# Patient Record
Sex: Male | Born: 1945 | Race: White | Hispanic: No | Marital: Married | Smoking: Former smoker
Health system: Southern US, Community
[De-identification: ages and names within clinical notes are randomized; demographics above are authoritative.]

## PROBLEM LIST (undated history)

## (undated) DIAGNOSIS — E7800 Pure hypercholesterolemia, unspecified: Secondary | ICD-10-CM

## (undated) DIAGNOSIS — I6350 Cerebral infarction due to unspecified occlusion or stenosis of unspecified cerebral artery: Secondary | ICD-10-CM

## (undated) DIAGNOSIS — H269 Unspecified cataract: Secondary | ICD-10-CM

## (undated) DIAGNOSIS — I671 Cerebral aneurysm, nonruptured: Secondary | ICD-10-CM

## (undated) DIAGNOSIS — Z8669 Personal history of other diseases of the nervous system and sense organs: Secondary | ICD-10-CM

## (undated) DIAGNOSIS — R040 Epistaxis: Secondary | ICD-10-CM

## (undated) DIAGNOSIS — IMO0001 Reserved for inherently not codable concepts without codable children: Secondary | ICD-10-CM

## (undated) DIAGNOSIS — T884XXA Failed or difficult intubation, initial encounter: Secondary | ICD-10-CM

## (undated) DIAGNOSIS — G039 Meningitis, unspecified: Secondary | ICD-10-CM

## (undated) DIAGNOSIS — N189 Chronic kidney disease, unspecified: Secondary | ICD-10-CM

## (undated) DIAGNOSIS — I10 Essential (primary) hypertension: Secondary | ICD-10-CM

## (undated) DIAGNOSIS — D509 Iron deficiency anemia, unspecified: Secondary | ICD-10-CM

## (undated) DIAGNOSIS — R351 Nocturia: Secondary | ICD-10-CM

## (undated) DIAGNOSIS — G629 Polyneuropathy, unspecified: Secondary | ICD-10-CM

## (undated) DIAGNOSIS — Z87448 Personal history of other diseases of urinary system: Secondary | ICD-10-CM

## (undated) DIAGNOSIS — K219 Gastro-esophageal reflux disease without esophagitis: Secondary | ICD-10-CM

## (undated) DIAGNOSIS — I729 Aneurysm of unspecified site: Secondary | ICD-10-CM

## (undated) DIAGNOSIS — Z8719 Personal history of other diseases of the digestive system: Secondary | ICD-10-CM

## (undated) DIAGNOSIS — L219 Seborrheic dermatitis, unspecified: Secondary | ICD-10-CM

## (undated) DIAGNOSIS — Z87898 Personal history of other specified conditions: Secondary | ICD-10-CM

## (undated) DIAGNOSIS — E785 Hyperlipidemia, unspecified: Secondary | ICD-10-CM

## (undated) DIAGNOSIS — F4312 Post-traumatic stress disorder, chronic: Secondary | ICD-10-CM

## (undated) DIAGNOSIS — N401 Enlarged prostate with lower urinary tract symptoms: Secondary | ICD-10-CM

## (undated) DIAGNOSIS — G4730 Sleep apnea, unspecified: Secondary | ICD-10-CM

## (undated) DIAGNOSIS — F411 Generalized anxiety disorder: Secondary | ICD-10-CM

## (undated) DIAGNOSIS — E119 Type 2 diabetes mellitus without complications: Secondary | ICD-10-CM

## (undated) DIAGNOSIS — R3915 Urgency of urination: Secondary | ICD-10-CM

## (undated) DIAGNOSIS — R578 Other shock: Secondary | ICD-10-CM

## (undated) DIAGNOSIS — D649 Anemia, unspecified: Secondary | ICD-10-CM

## (undated) HISTORY — DX: Other shock: R57.8

## (undated) HISTORY — DX: Aneurysm of unspecified site: I72.9

## (undated) HISTORY — DX: Personal history of other diseases of the digestive system: Z87.19

## (undated) HISTORY — DX: Type 2 diabetes mellitus without complications: E11.9

## (undated) HISTORY — DX: Meningitis, unspecified: G03.9

## (undated) HISTORY — DX: Personal history of other diseases of the nervous system and sense organs: Z86.69

## (undated) HISTORY — DX: Cerebral infarction due to unspecified occlusion or stenosis of unspecified cerebral artery: I63.50

## (undated) HISTORY — DX: Cerebral aneurysm, nonruptured: I67.1

## (undated) HISTORY — DX: Sleep apnea, unspecified: G47.30

## (undated) HISTORY — DX: Personal history of other diseases of urinary system: Z87.448

## (undated) HISTORY — DX: Hyperlipidemia, unspecified: E78.5

## (undated) HISTORY — PX: APPENDECTOMY: SHX54

## (undated) HISTORY — DX: Essential (primary) hypertension: I10

## (undated) HISTORY — DX: Generalized anxiety disorder: F41.1

## (undated) HISTORY — DX: Epistaxis: R04.0

---

## 2008-09-03 DIAGNOSIS — G039 Meningitis, unspecified: Secondary | ICD-10-CM

## 2008-09-03 HISTORY — DX: Meningitis, unspecified: G03.9

## 2008-10-25 ENCOUNTER — Inpatient Hospital Stay (HOSPITAL_COMMUNITY): Admission: AD | Admit: 2008-10-25 | Discharge: 2008-11-20 | Payer: Self-pay | Admitting: Emergency Medicine

## 2008-10-25 ENCOUNTER — Ambulatory Visit: Payer: Self-pay | Admitting: Cardiovascular Disease

## 2008-10-25 ENCOUNTER — Ambulatory Visit: Payer: Self-pay | Admitting: Internal Medicine

## 2008-10-26 ENCOUNTER — Encounter: Payer: Self-pay | Admitting: Emergency Medicine

## 2008-10-26 ENCOUNTER — Encounter (INDEPENDENT_AMBULATORY_CARE_PROVIDER_SITE_OTHER): Payer: Self-pay | Admitting: Family Medicine

## 2008-10-26 ENCOUNTER — Ambulatory Visit: Payer: Self-pay | Admitting: Infectious Diseases

## 2008-11-04 ENCOUNTER — Encounter: Payer: Self-pay | Admitting: Internal Medicine

## 2008-11-04 ENCOUNTER — Ambulatory Visit: Payer: Self-pay | Admitting: Physical Medicine & Rehabilitation

## 2008-11-08 ENCOUNTER — Ambulatory Visit: Payer: Self-pay | Admitting: Surgery

## 2008-11-08 ENCOUNTER — Encounter (INDEPENDENT_AMBULATORY_CARE_PROVIDER_SITE_OTHER): Payer: Self-pay | Admitting: Internal Medicine

## 2008-11-09 ENCOUNTER — Ambulatory Visit: Payer: Self-pay | Admitting: Gastroenterology

## 2008-11-18 ENCOUNTER — Encounter (INDEPENDENT_AMBULATORY_CARE_PROVIDER_SITE_OTHER): Payer: Self-pay | Admitting: Interventional Radiology

## 2008-12-08 ENCOUNTER — Inpatient Hospital Stay (HOSPITAL_COMMUNITY): Admission: EM | Admit: 2008-12-08 | Discharge: 2008-12-15 | Payer: Self-pay | Admitting: Emergency Medicine

## 2008-12-09 ENCOUNTER — Ambulatory Visit: Payer: Self-pay | Admitting: Infectious Diseases

## 2008-12-10 ENCOUNTER — Encounter: Payer: Self-pay | Admitting: Cardiovascular Disease

## 2008-12-13 ENCOUNTER — Encounter (INDEPENDENT_AMBULATORY_CARE_PROVIDER_SITE_OTHER): Payer: Self-pay | Admitting: Internal Medicine

## 2008-12-13 ENCOUNTER — Ambulatory Visit: Payer: Self-pay | Admitting: Vascular Surgery

## 2009-05-29 ENCOUNTER — Encounter: Payer: Self-pay | Admitting: Infectious Diseases

## 2009-05-31 ENCOUNTER — Encounter: Payer: Self-pay | Admitting: Infectious Diseases

## 2009-06-15 ENCOUNTER — Telehealth: Payer: Self-pay | Admitting: Infectious Diseases

## 2009-07-04 ENCOUNTER — Encounter (INDEPENDENT_AMBULATORY_CARE_PROVIDER_SITE_OTHER): Payer: Self-pay | Admitting: *Deleted

## 2009-07-04 DIAGNOSIS — I6350 Cerebral infarction due to unspecified occlusion or stenosis of unspecified cerebral artery: Secondary | ICD-10-CM | POA: Insufficient documentation

## 2009-07-04 DIAGNOSIS — I10 Essential (primary) hypertension: Secondary | ICD-10-CM | POA: Insufficient documentation

## 2009-07-04 DIAGNOSIS — Z87448 Personal history of other diseases of urinary system: Secondary | ICD-10-CM

## 2009-07-04 DIAGNOSIS — Z8719 Personal history of other diseases of the digestive system: Secondary | ICD-10-CM

## 2009-07-04 DIAGNOSIS — Z87898 Personal history of other specified conditions: Secondary | ICD-10-CM

## 2009-07-04 DIAGNOSIS — E785 Hyperlipidemia, unspecified: Secondary | ICD-10-CM

## 2009-07-04 DIAGNOSIS — G510 Bell's palsy: Secondary | ICD-10-CM | POA: Insufficient documentation

## 2009-07-04 HISTORY — DX: Bell's palsy: G51.0

## 2009-07-04 HISTORY — DX: Personal history of other specified conditions: Z87.898

## 2009-07-04 HISTORY — DX: Hyperlipidemia, unspecified: E78.5

## 2009-07-06 ENCOUNTER — Ambulatory Visit: Payer: Self-pay | Admitting: Infectious Diseases

## 2009-07-06 DIAGNOSIS — A4101 Sepsis due to Methicillin susceptible Staphylococcus aureus: Secondary | ICD-10-CM

## 2009-07-06 LAB — CONVERTED CEMR LAB
AST: 21 units/L (ref 0–37)
BUN: 20 mg/dL (ref 6–23)
Basophils Absolute: 0 10*3/uL (ref 0.0–0.1)
Calcium: 9 mg/dL (ref 8.4–10.5)
Chloride: 105 meq/L (ref 96–112)
Creatinine, Ser: 1.05 mg/dL (ref 0.40–1.50)
Eosinophils Absolute: 0.6 10*3/uL (ref 0.0–0.7)
Eosinophils Relative: 8 % — ABNORMAL HIGH (ref 0–5)
HCT: 37.1 % — ABNORMAL LOW (ref 39.0–52.0)
Hemoglobin: 12.3 g/dL — ABNORMAL LOW (ref 13.0–17.0)
IgA: 350 mg/dL (ref 68–378)
IgM, Serum: 76 mg/dL (ref 60–263)
Lymphocytes Relative: 20 % (ref 12–46)
Lymphs Abs: 1.4 10*3/uL (ref 0.7–4.0)
MCV: 85.7 fL (ref >=78.0)
Monocytes Absolute: 0.5 10*3/uL (ref 0.1–1.0)
RDW: 14.8 % (ref 11.5–15.5)
Total Bilirubin: 0.3 mg/dL (ref 0.3–1.2)

## 2009-07-07 ENCOUNTER — Encounter: Payer: Self-pay | Admitting: Infectious Diseases

## 2009-07-19 ENCOUNTER — Encounter: Payer: Self-pay | Admitting: Infectious Diseases

## 2009-07-20 ENCOUNTER — Ambulatory Visit: Payer: Self-pay | Admitting: Infectious Diseases

## 2009-07-20 ENCOUNTER — Telehealth (INDEPENDENT_AMBULATORY_CARE_PROVIDER_SITE_OTHER): Payer: Self-pay | Admitting: *Deleted

## 2009-07-20 DIAGNOSIS — R197 Diarrhea, unspecified: Secondary | ICD-10-CM | POA: Insufficient documentation

## 2009-07-21 ENCOUNTER — Telehealth: Payer: Self-pay | Admitting: Infectious Diseases

## 2009-07-21 ENCOUNTER — Telehealth (INDEPENDENT_AMBULATORY_CARE_PROVIDER_SITE_OTHER): Payer: Self-pay | Admitting: *Deleted

## 2009-08-17 ENCOUNTER — Ambulatory Visit: Payer: Self-pay | Admitting: Infectious Diseases

## 2009-12-25 ENCOUNTER — Encounter: Payer: Self-pay | Admitting: Infectious Diseases

## 2009-12-26 ENCOUNTER — Telehealth: Payer: Self-pay | Admitting: Infectious Diseases

## 2010-01-03 ENCOUNTER — Telehealth: Payer: Self-pay | Admitting: Infectious Diseases

## 2010-01-04 ENCOUNTER — Ambulatory Visit: Payer: Self-pay | Admitting: Internal Medicine

## 2010-01-04 ENCOUNTER — Inpatient Hospital Stay (HOSPITAL_COMMUNITY): Admission: EM | Admit: 2010-01-04 | Discharge: 2010-01-11 | Payer: Self-pay | Admitting: Emergency Medicine

## 2010-01-04 ENCOUNTER — Encounter: Payer: Self-pay | Admitting: Internal Medicine

## 2010-01-05 ENCOUNTER — Ambulatory Visit: Payer: Self-pay | Admitting: Vascular Surgery

## 2010-01-05 ENCOUNTER — Encounter: Payer: Self-pay | Admitting: Internal Medicine

## 2010-01-19 ENCOUNTER — Encounter: Payer: Self-pay | Admitting: Internal Medicine

## 2010-01-20 ENCOUNTER — Encounter: Payer: Self-pay | Admitting: Infectious Diseases

## 2010-01-23 ENCOUNTER — Ambulatory Visit: Payer: Self-pay | Admitting: Infectious Diseases

## 2010-02-15 ENCOUNTER — Ambulatory Visit: Payer: Self-pay | Admitting: Infectious Diseases

## 2010-02-28 ENCOUNTER — Encounter: Payer: Self-pay | Admitting: Infectious Diseases

## 2010-03-07 ENCOUNTER — Encounter: Payer: Self-pay | Admitting: Infectious Diseases

## 2010-05-22 ENCOUNTER — Ambulatory Visit: Payer: Self-pay | Admitting: Infectious Diseases

## 2010-09-23 ENCOUNTER — Other Ambulatory Visit: Payer: Self-pay | Admitting: Pediatrics

## 2010-09-23 DIAGNOSIS — I639 Cerebral infarction, unspecified: Secondary | ICD-10-CM

## 2010-09-24 ENCOUNTER — Encounter: Payer: Self-pay | Admitting: Nephrology

## 2010-10-03 NOTE — Miscellaneous (Signed)
Summary: [facility]: Orders  [facility]: Orders   Imported By: Bonner Puna 03/17/2010 16:20:37  _____________________________________________________________________  External Attachment:    Type:   Image     Comment:   External Document

## 2010-10-03 NOTE — Procedures (Signed)
Summary: Summary Report  Summary Report   Imported By: Gemma Payor 02/24/2010 10:15:42  _____________________________________________________________________  External Attachment:    Type:   Image     Comment:   External Document

## 2010-10-03 NOTE — Assessment & Plan Note (Signed)
Summary: f/u per paula[mkj]   CC:  follow-up visit.  History of Present Illness: 65 yo Haley with hx of adm 10-25-08 with MSSA bacteremia and CT showing acute vs subacute infarcts. He had a TTE (-) and was d/c home with iv ancef for 4 weeks. He was then adm 12-08-08 to 12-15-08 at [facility]. He was found to have a R facial droop and dificulty closing R eye. He had a RI showing multiple areas c/i blood products with some enhancement. Also had mild scattered white matter dz. He had a TEE that was negative. He was changed to IV ancef for 6 weeks of total therapy. His course was also complicated by increased Cr. He returns today after he had been hospitalized at [facility] 05-30-09 to 06-03-09 with Staph aureus (BCx 3/3 and UrineCx). Repeat Bcx done 9-28 negative. He was again started on a 6 week course of ancef. He had repeat BCx 11-3 which were negative.  Returned 07-20-09 with diarrhea- states that he feels like a herd of elephants stepped on his stomach. stayed in bed all day on 11-15. He was found to be C diff+ and his keflex was stopped, by mouth vanco was ordered. he did not want to take this and he was instead given a course of flagyl.   April 2011 [facility] was diagnosed with MSSA bacteremia and was discharged on April 30 with plan to complete 6 weeks of IV Ancef for treatment of this condition. Additionally, he was recently hospitalized in February 2010 for bacterial meningitis.  He presented to the [facility] Emergency Department on May 4 with a chief complaint of severe headaches and high blood pressure.  He described his headache as 10/10 throughout his entire head without any radiation.  He was thought to have rebled into one of his previous areas from Arpil [address] from [facility]. He had TEE in April at [place] as well, negative. 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 He was d/c home on 01-11-10, continued on ancef.  Today  c/o pruritis on his RUE from [facility] site.  Preventive Screening-Counseling & Management  Alcohol-Tobacco     Alcohol drinks/day: 0     Smoking Status: quit     Year Quit: 2010  Caffeine-Diet-Exercise     Caffeine use/day: coffee     Does Patient Exercise: yes     Type of exercise: active around the house  Safety-Violence-Falls     Seat Belt Use: yes   Current Allergies (reviewed today): ! * PLASTIC TAPE Past History:  Past Medical History: MSSA bacteremia, recurrent HTN Hyperlipidemia Pernicious Anemia  Current Medications (verified): 1)  Aspirin 325 Mg Tabs (Aspirin) .... Take 1 Tablet By Mouth Once A Day 2)  Norvasc 5 Mg Tabs (Amlodipine Besylate) .... Take 1 Tablet By Mouth Once A Day 3)  Toprol Xl 100 Mg Xr24h-Tab (Metoprolol Succinate) .... Take 1 Tablet By Mouth Once A Day 4)  Lovaza 1 Gm Caps (Omega-3-Acid Ethyl Esters) .... Take 2 Capsules By Mouth Two Times A Day 5)  Crestor 10 Mg Tabs (Rosuvastatin Calcium) .... Take 1 Tablet By Mouth Once A Day 6)  Lisinopril 40 Mg Tabs (Lisinopril) .... Take 1 Tablet By Mouth Once A Day 7)  Nexium 40 Mg Cpdr (Esomeprazole Magnesium) .... Take 1 Tablet By Mouth Once A Day 8)  Prenavite Multiple Vitamin 28-0.8 Mg Tabs (Prenatal Vit-Fe Fumarate-Fa) .... Take 1 Tablet By Mouth Once A  Day 9)  Vitamin B-12 1000 Mcg Tabs (Cyanocobalamin) .... Take 1 Tablet By Mouth Once A Day 10)  Cefazolin Sodium 1 Gm Solr (Cefazolin Sodium) .... 2 G Q8h 11)  Tamsulosin Hcl 0.4 Mg Caps (Tamsulosin Hcl) .... Take 1 Tablet By Mouth At Bedtime 12)  Celexa 40 Mg Tabs (Citalopram Hydrobromide) .... Take 1 Tablet By Mouth Once A Day 13)  Ultram 50 Mg Tabs (Tramadol Hcl) .... Q6h As Needed 14)  Hydrochlorothiazide 25 Mg Tabs (Hydrochlorothiazide) .... Take 1 Tablet By Mouth Once A Day 15)  Depakote 500 Mg Tbec (Divalproex Sodium) .... Take 1 Tablet By Mouth Two Times A Day Barry)  Klonopin 0.5 Mg Tabs (Clonazepam) .... Q8h As Needed  Allergies (verified): 1)  !  * Plastic Tape   Social History: Reviewed history from 07/06/2009 and no changes required. Former Smoker Alcohol use-no Married  Review of Systems       c/o  weakness, loss of peripheral vision, has had lower BP due to med adjustment, use of anxiolytics. headache is mostly resolved, has on L occipital area when he does have them. has had limited exertion. has gained 7#.   Vital Signs:  Patient profile:   65 year old male Height:      70 inches (177.80 cm) Weight:      202.8 pounds (92.18 kg) BMI:     29.20 Temp:     98.2 degrees F (36.78 degrees C) oral Pulse rate:   62 / minute BP sitting:   120 / 72  (left arm)  Vitals Entered By: Rocky Morel) (Jan 23, 2010 11:49 AM) CC: follow-up visit Pain Assessment Patient in pain? no      Nutritional Status BMI of 25 - 29 = overweight Nutritional Status Detail appetite is good per patient  Does patient need assistance? Functional Status Self care Ambulation Normal   Physical Exam  General:  well-developed, well-nourished, and well-hydrated.   Eyes:  pupils equal, pupils round, and pupils reactive to light.   Mouth:  pharynx pink and moist and no exudates.   Neck:  no masses.   Lungs:  normal respiratory effort and normal breath sounds.   Heart:  normal rate, regular rhythm, and no murmur.   Abdomen:  soft, non-tender, and normal bowel sounds.   Extremities:  has papular rash on RUE around tape fro his PIC. his PIC is clean, non-tender, no d/c.    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 advised him that he may use hydrocortionse around his [facility] for his rash, not on his PIC.  have him return after June 10 to recheck his BCx and labs.   Medications Added to Medication List This Visit: 1)  Norvasc 5 Mg  Tabs (Amlodipine besylate) .... Take 1 tablet by mouth once a day 2)  Lisinopril 40 Mg Tabs (Lisinopril) .... Take 1 tablet by mouth once a day 3)  Cefazolin Sodium 1 Gm Solr (Cefazolin sodium) .... 2 g q8h 4)  Tamsulosin Hcl 0.4 Mg Caps (Tamsulosin hcl) .... Take 1 tablet by mouth at bedtime 5)  Celexa 40 Mg Tabs (Citalopram hydrobromide) .... Take 1 tablet by mouth once a day 6)  Ultram 50 Mg Tabs (Tramadol hcl) .... Q6h as needed 7)  Hydrochlorothiazide 25 Mg Tabs (Hydrochlorothiazide) .Marland KitchenMarland KitchenMarland Kitchen  Take 1 tablet by mouth once a day 8)  Depakote 500 Mg Tbec (Divalproex sodium) .... Take 1 tablet by mouth two times a day 9)  Klonopin 0.5 Mg Tabs (Clonazepam) .... Q8h as needed 10)  Rifadin 300 Mg Caps (Rifampin) .... Take 1 tablet by mouth once a day Prescriptions: RIFADIN 300 MG CAPS (RIFAMPIN) Take 1 tablet by mouth once a day  #30 x 1   Entered and Authorized by:   Bobby Rumpf MD   Signed by:   Bobby Rumpf MD on 01/23/2010   Method used:   Print then Give to Patient   RxID:   [id number]   Appended Document: Office Visit (Infectious Disease)  BCx 01-04-10 negative.     Current Allergies: ! 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 if he has cont BCx+ will check IG levels and Indium scan. WIll start him on rifampin in addition to his other antibiotics at this point. it is not listed as an allergy in his hospital med list and they have no recollection of why it was not started previously. I do not have records indicating that his staph is resistant to this medication. return to clinic 3 weeks.   Orders: Est. Patient Level IV [id number]) Prescriptions: RIFADIN 300 MG CAPS (RIFAMPIN) Take 1 tablet by mouth once a day  #30 x 1   Entered and Authorized by:   Bobby Rumpf MD   Signed by:   Bobby Rumpf MD on 01/23/2010   Method used:   Electronically to        [facility]   [address] (retail)       [address], [place]  [address]       Ph: [phone] or [phone]       Fax: [phone]   RxID:   [id number]

## 2010-10-03 NOTE — Progress Notes (Signed)
Summary: Daughter calling about patient  Phone Note Call from Patient   Caller: Daughter Summary of Call: Daughter of patient called in reference to inform our office that patient had an ED visit at [facility] on 12-25-09. Patient had blood cultures and results according to daughter should be back on 12-26-09. If any questions, could call daughter at [phone]. Initial call taken by: Sherrie George [facility]),  December 26, 2009 12:32 PM  Follow-up for Phone Call        vomiting, fever since Sat. and Sunday he was taken to ED @ [facility]  last night given zofran and tylenol .   Today 101 and 102 with muscle aches and headache.  Daughter advised I will call Dr Johnnye Sima for advice.  Anderson Malta [id number] [facility] RN  December 26, 2009 4:51 PM   Additional Follow-up for Phone Call Additional follow up Details #1::        Per Dr Johnnye Sima pt was advise to return to ED if symptoms are worse.  Left message on voicemail to call the physician on call if she had any concerns about what to do. Orland Mustard RN  December 26, 2009 5:24 PM Also call the office  in the am for appt if needed.  Orland Mustard RN  December 26, 2009 5:24 PM     Additional Follow-up for Phone Call Additional follow up Details #2::    Voicemail:   Pt's daughter called :  he was taken back to the ED in [place] and admitted to the hospital with  a staph infection in his blood and urine.   Orland Mustard RN  December 27, 2009 3:56 PM  Follow-up by: Orland Mustard RN,  December 27, 2009 3:57 PM

## 2010-10-03 NOTE — Assessment & Plan Note (Signed)
Summary: FOLLOW UP VISIT / PW   CC:  follow-up visit.  History of Present Illness: 65 yo M with hx of adm 10-25-08 with MSSA bacteremia and CT showing acute vs subacute infarcts. He had a TTE (-) and was d/c home with iv ancef for 4 weeks. He was then adm 12-08-08 to 12-15-08 at [facility]. He was found to have a R facial droop and dificulty closing R eye. He had a RI showing multiple areas c/i blood products with some enhancement. Also had mild scattered white matter dz. He had a TEE that was negative. He was changed to IV ancef for 6 weeks of total therapy. His course was also complicated by increased Cr. He was then hospitalized at [facility] 05-30-09 to 06-03-09 with Staph aureus (BCx 3/3 and UrineCx). Repeat Bcx done 9-28 negative. He was again started on a 6 week course of ancef. He had repeat BCx 11-3 which were negative.  Returned 07-20-09 with diarrhea- found to be C diff+ and his keflex was stopped, by mouth vanco was ordered. he did not want to take this and he was instead given a course of flagyl.   Additionally, he was recently hospitalized in February 2010 for bacterial meningitis.  April 2011 [facility] was diagnosed with MSSA bacteremia and was discharged on April 30 with plan to complete 6 weeks of IV Ancef for treatment of this condition.   He presented to the [facility] Emergency Department on May 4 with a chief complaint of severe headaches and high blood pressure. He was thought to have rebled into one of his previous areas from Arpil [address] from [place] [facility]. He had TEE in April at [place] as well, negative. 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 He was d/c home on 01-11-10, continued on ancef rifampin added.  today states he is ready for his PIC to come out. c/o itching and burning. no fevers or chills. Headaches are "alot better". Finished ancef 02-11-10.   Preventive Screening-Counseling &  Management  Alcohol-Tobacco     Alcohol drinks/day: 0     Smoking Status: quit     Year Quit: 2010  Caffeine-Diet-Exercise     Caffeine use/day: coffee     Does Patient Exercise: yes     Type of exercise: active around the house  Safety-Violence-Falls     Seat Belt Use: yes   Updated Prior Medication List: ASPIRIN 325 MG TABS (ASPIRIN) Take 1 tablet by mouth once a day NORVASC 5 MG TABS (AMLODIPINE BESYLATE) Take 1 tablet by mouth once a day TOPROL XL 100 MG XR24H-TAB (METOPROLOL SUCCINATE) Take 1 tablet by mouth once a day LOVAZA 1 GM CAPS (OMEGA-3-ACID ETHYL ESTERS) Take 2 capsules by mouth two times a day CRESTOR 10 MG TABS (ROSUVASTATIN CALCIUM) Take 1 tablet by mouth once a day LISINOPRIL 40 MG TABS (LISINOPRIL) Take 1 tablet by mouth once a day NEXIUM 40 MG CPDR (ESOMEPRAZOLE MAGNESIUM) Take 1 tablet by mouth once a day PRENAVITE MULTIPLE VITAMIN 28-0.8 MG TABS (PRENATAL VIT-FE FUMARATE-FA) Take 1 tablet by mouth once a day VITAMIN B-12 1000 MCG TABS (CYANOCOBALAMIN) Take 1 tablet by mouth once a day TAMSULOSIN HCL 0.4 MG CAPS (TAMSULOSIN HCL) Take 1 tablet by mouth at bedtime CELEXA 40 MG TABS (CITALOPRAM HYDROBROMIDE) Take 1 tablet by mouth once a day ULTRAM 50 MG TABS (TRAMADOL HCL) q6h as needed HYDROCHLOROTHIAZIDE 25 MG TABS (HYDROCHLOROTHIAZIDE) Take 1 tablet by mouth  once a day DEPAKOTE 500 MG TBEC (DIVALPROEX SODIUM) Take 1 tablet by mouth two times a day KLONOPIN 0.5 MG TABS (CLONAZEPAM) q8h as needed  Current Allergies (reviewed today): ! * PLASTIC TAPE Past History:  Past medical, surgical, family and social histories (including risk factors) reviewed, and no changes noted (except as noted below).  Past Medical History: Reviewed history from 01/23/2010 and no changes required. MSSA bacteremia, recurrent HTN Hyperlipidemia Pernicious Anemia  Family History: Reviewed history from 07/06/2009 and no changes required. father- alzheinmers Family History  Diabetes 1st degree relative Family History Hypertension  Social History: Reviewed history from 07/06/2009 and no changes required. Former Smoker Alcohol use-no Married  Review of Systems       moving bowels regularly, no diarrhea. no dysuria or cloudiness. nocturia has improved some. no f/c.    Vital Signs:  Patient profile:   65 year old male Height:      70 inches (177.80 cm) Weight:      205.8 pounds (93.55 kg) BMI:     29.64 Temp:     98.3 degrees F (36.83 degrees C) oral Pulse rate:   70 / minute BP sitting:   149 / 82  (left arm)  Vitals Entered By: Rocky Morel) (February 15, 2010 2:04 PM) CC: follow-up visit Pain Assessment Patient in pain? no      Nutritional Status BMI of 25 - 29 = overweight Nutritional Status Detail appetite is good per patient  Does patient need assistance? Functional Status Self care Ambulation Normal   Physical Exam  General:  well-developed, well-nourished, and well-hydrated.   Eyes:  pupils equal, pupils round, and pupils reactive to light.   Mouth:  pharynx pink and moist, poor dentition, and teeth missing.   Neck:  no masses.   Lungs:  normal respiratory effort and normal breath sounds.   Heart:  normal rate, regular rhythm, and Grade  3 /6 systolic ejection murmur.   Abdomen:  soft, non-tender, and normal bowel sounds.   Extremities:  RUE PIC mild erythema around site. no d/c.    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 will pull his PIC. recheck his BCx. I considered putting him back on keflex for suppresion but he and his family are less than enthusiastic about this.  his family asks about having a urology eval for a prev bladder abn seen while he was in [place]. I have asked them to call Dr Lajuan Lines who saw pt in hospital at [facility].  return to clinic 4-6 weeks.   Orders: Est. Patient Level IV  [id number]) T-Culture, Blood Routine [id number]) T-Culture, Blood Routine [id number])  Problem # 2:  CLOSTRIDIUM DIFFICILE COLITIS, HX OF (ICD-V12.79) this appears to have resolved.   Appended Document: FOLLOW UP VISIT, PICC removed Order to remove PICC line received from Dr. Page Spiro .  Pt. identified with name and date of birth.  Dressing removed and PICC site unremarkable.  PICC line removed using sterile procedure @ 1500 pm.   PICC length 39cm.  Sterile petroleum gauze + sterile 4X4 applied to PICC site, pressure applied for 10 minutes and covered with Medipore tape as a pressure dressing.  Pt. instructed to limit use of arm for 1 hour.  Pt. instructed that the pressure dressing should remain in place for 24 hours.  Pt. verablized understanding of these instructions.  Lorne Skeens RN  February 15, 2010 3:52 PM

## 2010-10-03 NOTE — Assessment & Plan Note (Signed)
Summary: f/u [mkj]   CC:  follow-up visit.  History of Present Illness: 65 yo M with hx of adm 10-25-08 with MSSA bacteremia and CT showing acute vs subacute infarcts. He had a TTE (-) and was d/c home with iv ancef for 4 weeks. He was then adm 12-08-08 to 12-15-08 at [facility]. He was found to have a R facial droop and dificulty closing R eye. He had a RI showing multiple areas c/i blood products with some enhancement. Also had mild scattered white matter dz. He had a TEE that was negative. He was changed to IV ancef for 6 weeks of total therapy. His course was also complicated by increased Cr. He was then hospitalized at [facility] 05-30-09 to 06-03-09 with Staph aureus (BCx 3/3 and UrineCx). Repeat Bcx done 9-28 negative. He was again started on a 6 week course of ancef. He had repeat BCx 11-3 which were negative.  Returned 07-20-09 with diarrhea- found to be C diff+ and his keflex was stopped,given a course of flagyl.   Additionally, he was recently hospitalized in February 2010 for bacterial meningitis.  April 2011 [facility] was diagnosed with MSSA bacteremia and was discharged on April 30 with plan to complete 6 weeks of IV Ancef for treatment of this condition.   He presented to the [facility] Emergency Department on May 4 with a chief complaint of severe headaches and high blood pressure. He was thought to have rebled into one of his previous areas from Arpil 28 from [place] [facility]. He had TEE in April at [place] as well, negative. 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 He was d/c home on 01-11-10, continued on ancef rifampin added. Finished ancef 02-11-10.   Was seen in ID clinic 02-15-10 and was doing well. His antibiotics were stopped, PIC pulled and he had repeat BCx negative.  Recently failed sleep study and was told this was related to his strokes. Was seen in f/u by Urology and told his bladder was fxn properly.  Cystoscopy negative.  no f/c. has not been sick since last visit. feels like he is getting stronger.   Preventive Screening-Counseling & Management  Alcohol-Tobacco     Alcohol drinks/day: 0     Smoking Status: quit     Year Quit: 2010  Caffeine-Diet-Exercise     Caffeine use/day: coffee and tea     Does Patient Exercise: yes     Type of exercise: active around the house  Safety-Violence-Falls     Seat Belt Use: yes   Updated Prior Medication List: ASPIRIN 325 MG TABS (ASPIRIN) Take 1 tablet by mouth once a day NORVASC 5 MG TABS (AMLODIPINE BESYLATE) Take 1 tablet by mouth once a day TOPROL XL 100 MG XR24H-TAB (METOPROLOL SUCCINATE) Take 1 tablet by mouth once a day LOVAZA 1 GM CAPS (OMEGA-3-ACID ETHYL ESTERS) Take 2 capsules by mouth two times a day CRESTOR 10 MG TABS (ROSUVASTATIN CALCIUM) Take 1 tablet by mouth once a day LISINOPRIL 40 MG TABS (LISINOPRIL) Take 1 tablet by mouth once a day NEXIUM 40 MG CPDR (ESOMEPRAZOLE MAGNESIUM) Take 1 tablet by mouth once a day PRENAVITE MULTIPLE VITAMIN 28-0.8 MG TABS (PRENATAL VIT-FE FUMARATE-FA) Take 1 tablet by mouth once a day VITAMIN B-12 1000 MCG TABS (CYANOCOBALAMIN) Take 1 tablet by mouth once a day TAMSULOSIN HCL 0.4 MG CAPS (TAMSULOSIN HCL) Take 1 tablet by mouth at bedtime CELEXA 40 MG TABS (CITALOPRAM HYDROBROMIDE)  Take 1 tablet by mouth once a day ULTRAM 50 MG TABS (TRAMADOL HCL) q6h as needed HYDROCHLOROTHIAZIDE 25 MG TABS (HYDROCHLOROTHIAZIDE) Take 1 tablet by mouth once a day DEPAKOTE 500 MG TBEC (DIVALPROEX SODIUM) Take 1 tablet by mouth two times a day KLONOPIN 0.5 MG TABS (CLONAZEPAM) q8h as needed  Current Allergies (reviewed today): ! * PLASTIC TAPE Past History:  Past medical, surgical, family and social histories (including risk factors) reviewed, and no changes noted (except as noted below).  Past Medical History: Reviewed history from 01/23/2010 and no changes required. MSSA bacteremia,  recurrent HTN Hyperlipidemia Pernicious Anemia  Family History: Reviewed history from 07/06/2009 and no changes required. father- alzheinmers Family History Diabetes 1st degree relative Family History Hypertension  Social History: Reviewed history from 07/06/2009 and no changes required. Former Smoker Alcohol use-no Married  Vital Signs:  Patient profile:   65 year old male Height:      70 inches (177.80 cm) Weight:      209.4 pounds (95.18 kg) BMI:     30.15 Temp:     98.2 degrees F (36.78 degrees C) oral Pulse rate:   70 / minute BP sitting:   122 / 76  (left arm)  Vitals Entered By: Rocky Morel) (May 22, 2010 10:28 AM) CC: follow-up visit Pain Assessment Patient in pain? no      Nutritional Status BMI of > 30 = obese Nutritional Status Detail appetite is excellent per patient  Does patient need assistance? Functional Status Self care Ambulation Normal   Physical Exam  General:  well-developed, well-nourished, and well-hydrated.   Eyes:  pupils equal, pupils round, and pupils reactive to light.   Mouth:  pharynx pink and moist and poor dentition.   Lungs:  normal respiratory effort and normal breath sounds.   Heart:  normal rate, regular rhythm, and no murmur.   Abdomen:  soft, non-tender, and normal bowel sounds.     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 this could be related to immune defect but this would be very uncommon to present at this age.  will give him rx for IE prophylaxis- amoxil 2g by mouth x1. will f/u as needed.   Orders: Est. Patient Level III [id number])  Medications Added to Medication List This Visit: 1)  Amoxicillin 500 Mg Caps (Amoxicillin) .... 4 tabs 30 minutes prior to dental procedure. Prescriptions: AMOXICILLIN 500 MG CAPS (AMOXICILLIN) 4 tabs 30 minutes  prior to dental procedure.  #4 x 0   Entered and Authorized by:   Bobby Rumpf MD   Signed by:   Bobby Rumpf MD on 05/22/2010   Method used:   Print then Give to Patient   RxID:   [id number]

## 2010-10-03 NOTE — Progress Notes (Signed)
Summary: daughter called   Phone Note Call from Patient   Caller: Jennifer-daughter of patient called Summary of Call: Patient's daughter called and stated that patient had been in the hospital from 12-26-09 to 12-31-09 with an ED visit on 12-25-09. Daughter said that  patient has a follow up appointment with PCP this month (i think she said the 16th). Stated that she could  be reached at [phone] or cellphone is [phone].  Initial call taken by: Sherrie George [facility]),  Jan 03, 2010 11:23 AM

## 2010-10-03 NOTE — Consult Note (Signed)
Summary: [facility]   Imported By: Bonner Puna 02/03/2010 14:41:39  _____________________________________________________________________  External Attachment:    Type:   Image     Comment:   External Document

## 2010-10-03 NOTE — Miscellaneous (Signed)
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 The headcahe is severe to verey sever present in the entire head, no radiation, aggravated by standing and relieved with lying still and pain meds, a/w 1 episode of nausea but no vomiting. No facial weakness, slurring of speech or any focal weakness. No palpitations, cough, fevers, shortness of breath, abdominal pain, chnage in bowel or urinary habits. No h/o head trauma.  ALLERGIES: NKDA  PAST MEDICAL HISTORY: MSSA bacteremia x2 HTN Hyperlipidemia H/o bacterial meningitis multiple cerebral infarcts Pernicious Anemia   MEDICATIONS: ASPIRIN 325 MG TABS (ASPIRIN) Take 1 tablet by mouth once a day NORVASC 10 MG TABS (AMLODIPINE BESYLATE) Take 1 tablet by mouth once a day TOPROL XL 100 MG XR24H-TAB (METOPROLOL SUCCINATE) Take 1 tablet by mouth once a day LOVAZA 1 GM CAPS (OMEGA-3-ACID ETHYL ESTERS) Take 2 capsules by mouth two times a day CRESTOR 10 MG TABS (ROSUVASTATIN CALCIUM) Take 1 tablet by mouth once a day LISINOPRIL 40 MG TABS (LISINOPRIL) Take 1 tablet by mouth once a day NEXIUM 40 MG CPDR (ESOMEPRAZOLE MAGNESIUM) Take 1 tablet by mouth once a day VITAMIN B-12 1000 MCG TABS (CYANOCOBALAMIN) Take 1 tablet by mouth once a day Ancef 2gm IV Q8H   SOCIAL HISTORY: Social History:  Former Smoker- quit 1 year ago Alcohol use-no Married and lives with his wife   FAMILY HISTORY Family History: father- alzheimers Family History Diabetes  siblings and 1st degree relative Family History Hypertension   ROS: as per HPI  VITALS:  T: 98.1 P:63  BP:193/94  R: 18 O2SAT:99%  ON:RA  PHYSICAL EXAM: Gen: AOx3, in no acute distress Eyes: PERRL, EOMI ENT:MMM, No erythema noted in posterior pharynx Neck: No JVD, No LAP Chest: CTAB with  good respiratory effort CVS: regular rhythmic rate, NO M/R/G, S1 S2 normal Abdo: soft,ND, BS+x4, Non tender and No hepatosplenomegaly EXT: No odema noted Neuro: Non focal, gait is normal, 5/5 in all 4 limbs, normal reflexes Skin: no rashes noted.   LABS:  WBC                                      7.9               4.0-10.5         K/uL  RBC                                      3.72       l      4.22-5.81        MIL/uL  Hemoglobin (HGB)                         11.0       l      13.0-17.0  g/dL  Hematocrit (HCT)                         31.9       l      39.0-52.0        %  MCV                                      85.8              78.0-100.0       fL  MCHC                                     34.5              30.0-36.0        g/dL  RDW                                      14.3              11.5-15.5        %  Platelet Count (PLT)                     264               150-400          K/uL  Neutrophils, %                           73                43-77            %  Lymphocytes, %                           16                12-46            %  Monocytes, %                             8                 3-12             %  Eosinophils, %                           2                 0-5              %  Basophils, %                             0                 0-1              %  Neutrophils, Absolute  5.8               1.7-7.7          K/uL  Lymphocytes, Absolute                    1.3               0.7-4.0          K/uL  Monocytes, Absolute                      0.7               0.1-1.0          K/uL  Eosinophils, Absolute                    0.1               0.0-0.7           K/uL  Basophils, Absolute                      0.0               0.0-0.1          K/uL   TCO2                                     28                0-100            mmol/L  Ionized Calcium                          1.13              1.12-1.32        mmol/L  Hemoglobin (HGB)                         11.2       l      13.0-17.0        g/dL  Hematocrit (HCT)                         33.0       l      39.0-52.0        %  Sodium (NA)                              138               135-145          mEq/L  Potassium (K)                            3.6               3.5-5.1          mEq/L  Chloride                                 100  96-112           mEq/L  Glucose                                  105        h      70-99            mg/dL  BUN                                      9                 6-23             mg/dL  Creatinine                               0.9               0.4-1.5          mg/dL   Protime ( Prothrombin Time)              15.1              11.6-15.2        seconds  INR                                      1.20              0.00-1.49  PTT(a-Partial Thromboplastn Time)        29                24-37            seconds  CT scan Head   IMPRESSION:   1.  Acute parenchymal hemorrhage in the right occipital lobe with   surrounding edema and mild mass effect.  No intraventricular or   extra-axial hemorrhage.   2.  Sequelae of prior parenchymal hemorrhages elsewhere, with   interval expected evolution of changes in the left cerebellum.    Critical test results telephoned to Dr. Jenny Reichmann Mulpus at the time of   interpretation on 01/04/2010 at 0020 hours.  On comparison with the   [facility] hospital MRI, blood products in the right occipital lobe   appear increased.  Differential considerations include hemorrhagic   transformation of an infarct, hypertensive hemorrhage, and septic   emboli.  ASSESSMENT AND PLAN: (1) Headaches/Multiple cerebral infarcts/hemmorhage: The CT  scan result is c/w increased bleeding in the already present hemorrhagic area of right occipital region. Neurology saw the patient and they suggested control of blood pressure and follow up CT scans and MRI/MRA to evaluate the hemmorhage. patient does not ahve nay signs of meningismus nor does does he has any focal neurological signs at this time which excludes new stroke or meningitis. We will also check UDS and HIV. Follow Neuro recs>> Cardizem drip to control SBP between 150-170. (2) Bacteremia: patient is already on Ancef and we will continue that while he is here. Patient had TEE done in [facility] as he has a h/o PFO diagnosed in April 2010 by TEE. We may try to obtain records from [facility] for the most recent TEE results  and culture. We will recheck BC X 2 while he is in the hospital. (3)HTN: Patient's BP was well controlled before this recent hopsitalization. We will start the Cardizem drip for now as per Neuro and continue home meds ie toprol XL, lisinopril and Norvasc. (4)Hyperlipidemia: Continue on Crestor. (5) VTE prophylasix: SCD's.

## 2010-10-03 NOTE — Miscellaneous (Signed)
Summary: [facility]: Orders  [facility]: Orders   Imported By: Bonner Puna 03/03/2010 15:52:59  _____________________________________________________________________  External Attachment:    Type:   Image     Comment:   External Document

## 2010-10-28 IMAGING — CT CT ABDOMEN W/O CM
2 of 4 series · 13 of 32 positions shown, 18 images · non-contrast
Comparison: No prior CT.  Abdominal ultrasound of 10/25/2008

CT ABDOMEN

CLINICAL DATA: Weakness.  Fatigue.  Nausea vomiting and diarrhea.
Confusion.

CT ABDOMEN AND PELVIS WITHOUT CONTRAST
TECHNIQUE: Multidetector CT imaging of the abdomen and pelvis was
performed following the standard protocol with oral but without
intravenous contrast.

[Series 2: routine abdomen · axial · 0.93mm/px · z∈[-469,-104]mm · 8 of 95 slices shown, 13 images]
[im 11/95  soft-tissue]
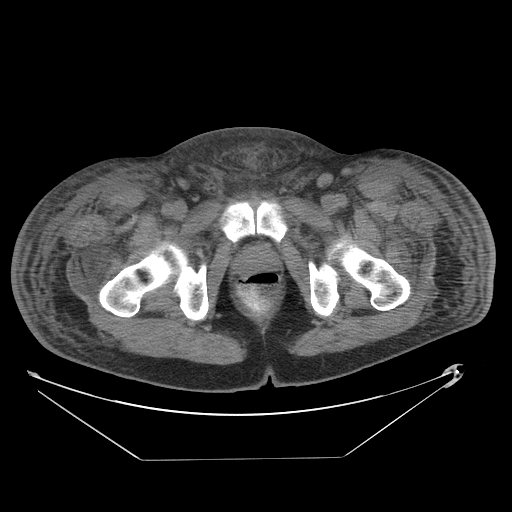
[im 11/95  bone]
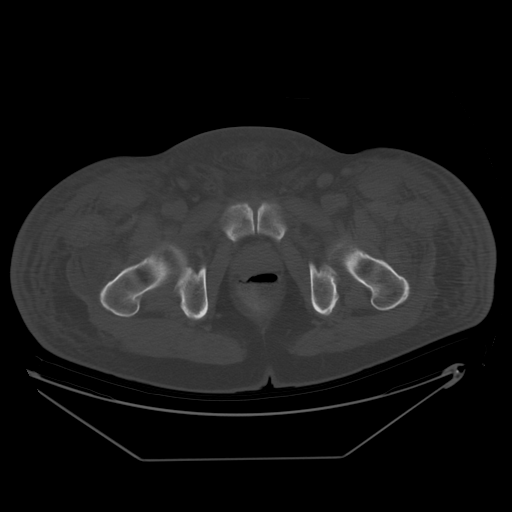
[im 21/95  soft-tissue]
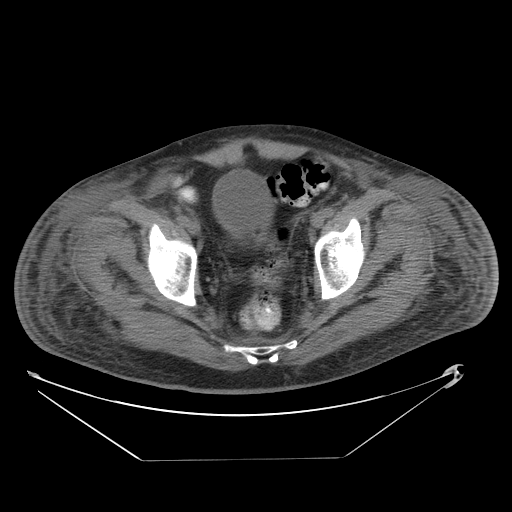
[im 32/95  soft-tissue]
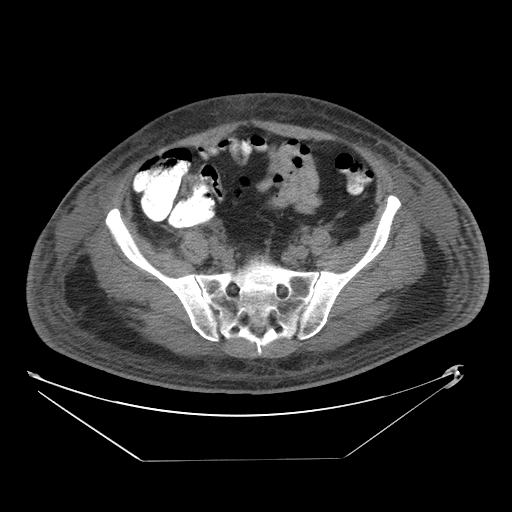
[im 42/95  soft-tissue]
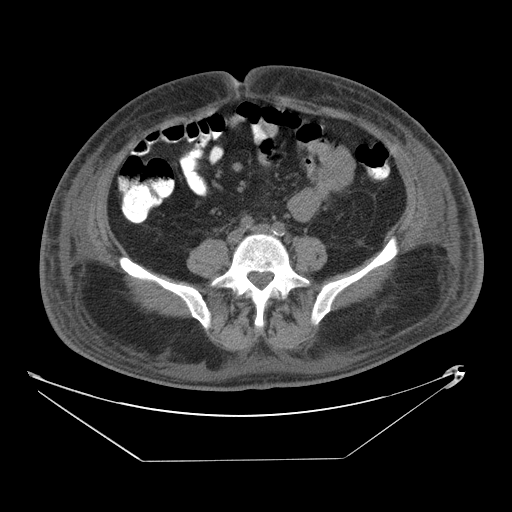
[im 53/95  soft-tissue]
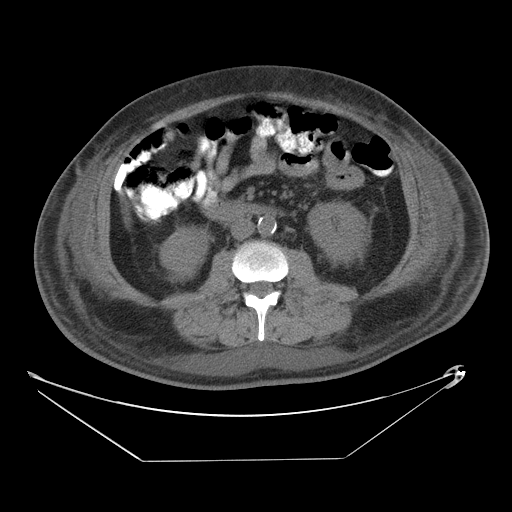
[im 53/95  lung]
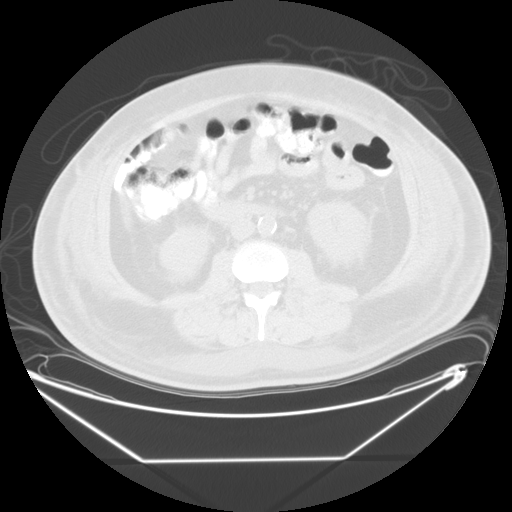
[im 63/95  soft-tissue]
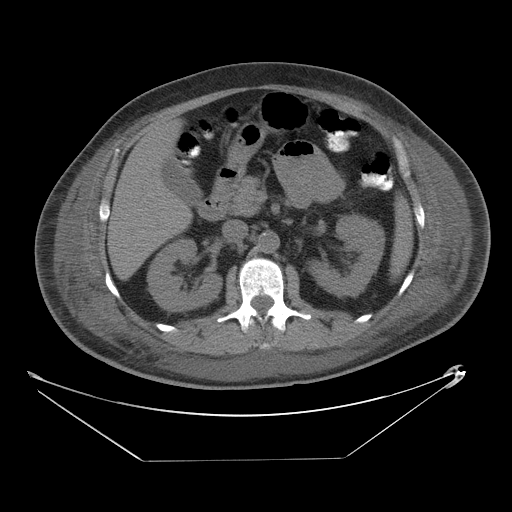
[im 63/95  lung]
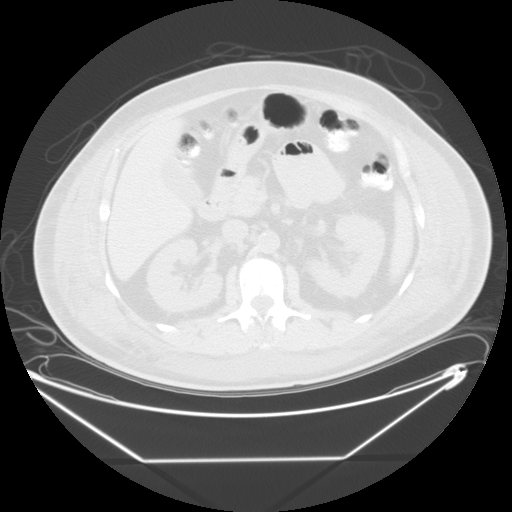
[im 74/95  soft-tissue]
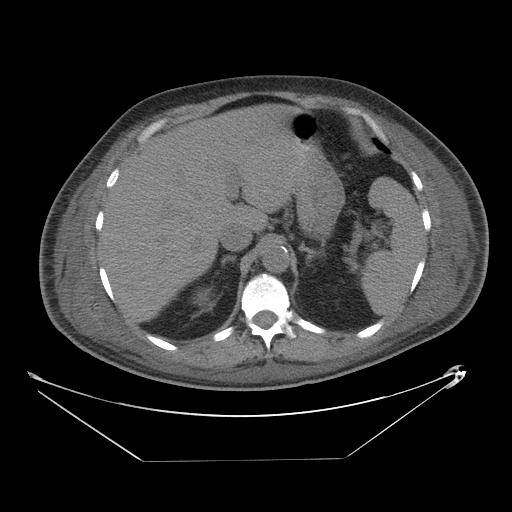
[im 74/95  lung]
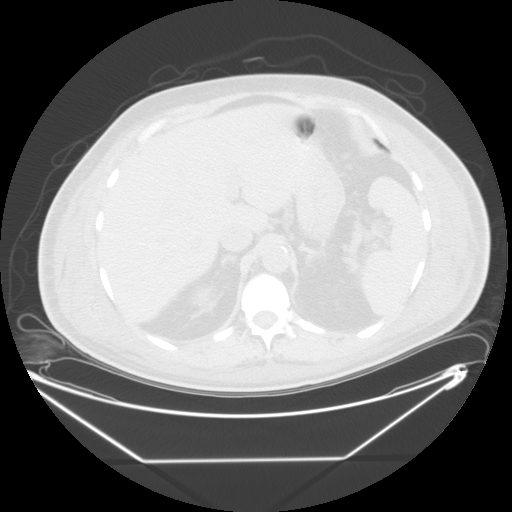
[im 84/95  soft-tissue]
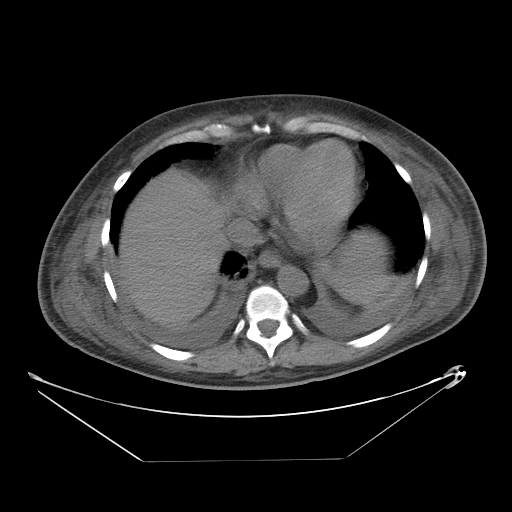
[im 84/95  lung]
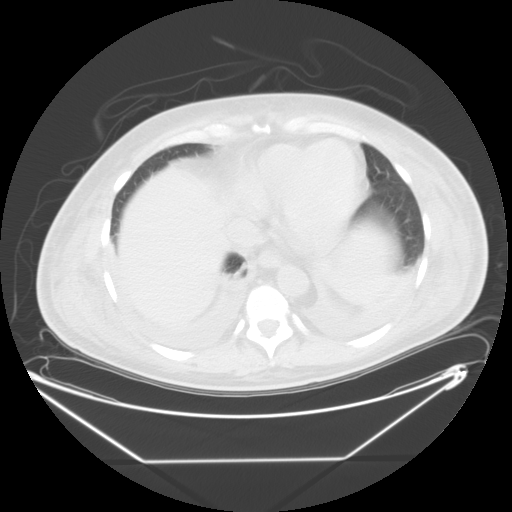

[Series 400: reformatted · sagittal · 0.93mm/px · 5 of 130 slices shown]
[im 12/130  soft-tissue]
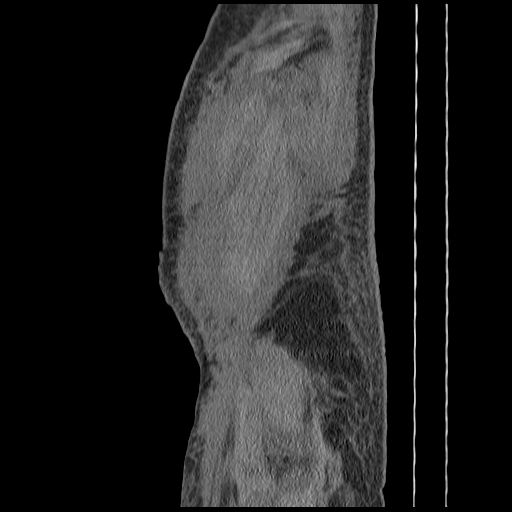
[im 24/130  soft-tissue]
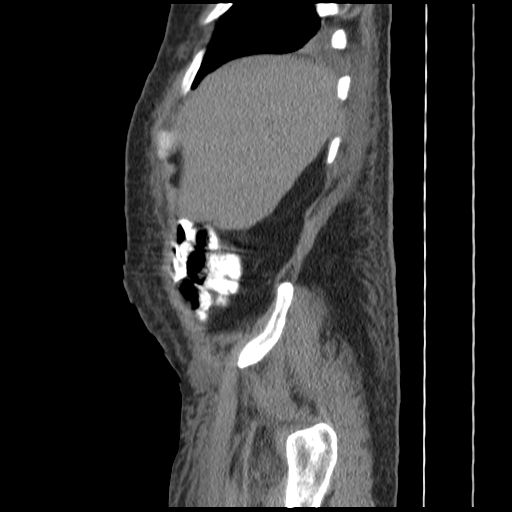
[im 47/130  soft-tissue]
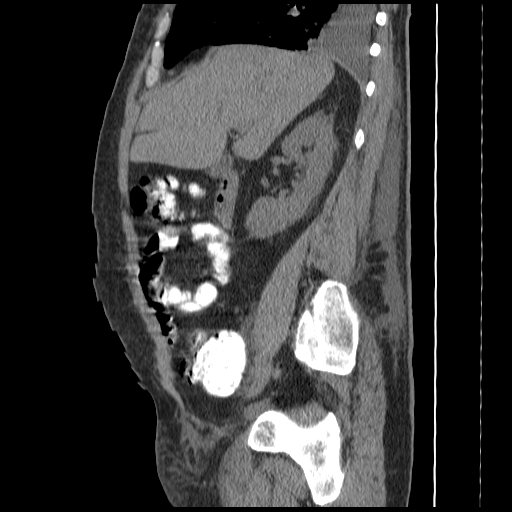
[im 59/130  soft-tissue]
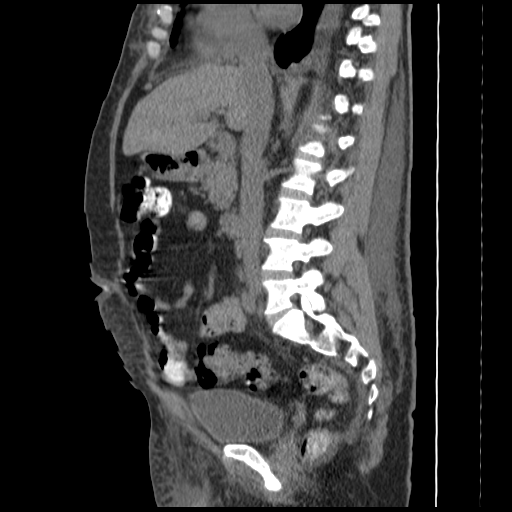
[im 71/130  soft-tissue]
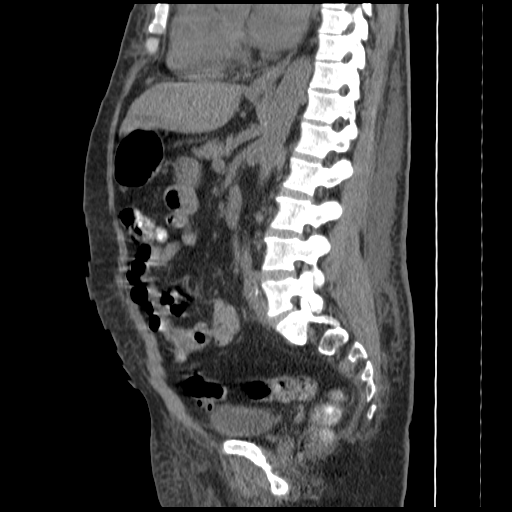

[13 of 32 positions shown; findings below may reference images not displayed]

FINDINGS: Mild bibasilar atelectasis.  Probable anemia as
evidenced by decreased density of the intravascular space.  Normal
heart size.  Small bilateral pleural effusions.  Normal uninfused
appearance of the liver.  Normal spleen with variant cleft within.
Normal stomach, pancreas, gallbladder, biliary tract, adrenal
glands.  Nonspecific bilateral perirenal edema.  No hydronephrosis.
A 7 mm hyperattenuating upper pole left renal lesion is most likely
a complex cyst.

No retroperitoneal or retrocrural adenopathy. Normal colon and
terminal ileum.  Appendix is not visualized but there is no
evidence of right lower quadrant inflammation.

Normal abdominal small bowel without ascites.
IMPRESSION: 1. No acute abdominal process.
2.  Sub cm hyperattenuating left renal lesion is most likely a
hemorrhagic cyst.  This is technically indeterminate on this
unenhanced CT.
3.  Diffuse anasarca with small bilateral pleural effusions.
Question fluid overload.
4.  Lack of visualization of the appendix without evidence of right
lower quadrant inflammation.

CT PELVIS
FINDINGS: Normal pelvic bowel loops.  Nonspecific presacral edema
is likely related to the overall edematous process.  Right external
iliac lymph node 1.2 cm on image 77.  1.1 cm on image 68.

Small bladder saccules or diverticula.  Normal prostate.  Moderate
diffuse anasarca. No acute osseous abnormality.
IMPRESSION: 1.  Mild pelvic adenopathy.  This is nonspecific.  Correlate with
any history of primary malignancy.  Consider follow-up with pelvic
CT at approximately 3 months.

2.  Bladder saccules or diverticula.
3.  Diffuse anasarca.

## 2010-11-08 ENCOUNTER — Encounter: Payer: Self-pay | Admitting: *Deleted

## 2010-11-13 ENCOUNTER — Ambulatory Visit
Admission: RE | Admit: 2010-11-13 | Discharge: 2010-11-13 | Disposition: A | Discharge status: Home / Self Care | Payer: Medicare Other | Source: Ambulatory Visit | Attending: Pediatrics | Admitting: Pediatrics

## 2010-11-13 ENCOUNTER — Ambulatory Visit
Admission: RE | Admit: 2010-11-13 | Discharge: 2010-11-13 | Disposition: A | Discharge status: Home / Self Care | Payer: Self-pay | Source: Ambulatory Visit | Attending: Pediatrics | Admitting: Pediatrics

## 2010-11-13 DIAGNOSIS — I639 Cerebral infarction, unspecified: Secondary | ICD-10-CM

## 2010-11-13 MED ORDER — IOHEXOL 350 MG/ML SOLN
80.0000 mL | Freq: Once | INTRAVENOUS | Status: AC | PRN
  Administered 2010-11-13: 80 mL via INTRAVENOUS

## 2010-11-21 LAB — BASIC METABOLIC PANEL
BUN: 13 mg/dL (ref 6–23)
BUN: 14 mg/dL (ref 6–23)
BUN: 14 mg/dL (ref 6–23)
BUN: 20 mg/dL (ref 6–23)
BUN: 24 mg/dL — ABNORMAL HIGH (ref 6–23)
CO2: 25 mEq/L (ref 19–32)
CO2: 27 mEq/L (ref 19–32)
CO2: 27 mEq/L (ref 19–32)
CO2: 27 mEq/L (ref 19–32)
CO2: 29 mEq/L (ref 19–32)
Calcium: 8.3 mg/dL — ABNORMAL LOW (ref 8.4–10.5)
Calcium: 8.5 mg/dL (ref 8.4–10.5)
Calcium: 9.2 mg/dL (ref 8.4–10.5)
Chloride: 102 mEq/L (ref 96–112)
Chloride: 105 mEq/L (ref 96–112)
Chloride: 96 mEq/L (ref 96–112)
Chloride: 97 mEq/L (ref 96–112)
Chloride: 99 mEq/L (ref 96–112)
Creatinine, Ser: 0.98 mg/dL (ref 0.4–1.5)
Creatinine, Ser: 1 mg/dL (ref 0.4–1.5)
Creatinine, Ser: 1.04 mg/dL (ref 0.4–1.5)
GFR calc Af Amer: >60 mL/min (ref >60)
GFR calc Af Amer: >60 mL/min (ref >60)
GFR calc Af Amer: >60 mL/min (ref >60)
GFR calc Af Amer: >60 mL/min (ref >60)
GFR calc Af Amer: >60 mL/min (ref >60)
GFR calc non Af Amer: 56 mL/min — ABNORMAL LOW (ref >60)
GFR calc non Af Amer: >60 mL/min (ref >60)
GFR calc non Af Amer: >60 mL/min (ref >60)
GFR calc non Af Amer: >60 mL/min (ref >60)
GFR calc non Af Amer: >60 mL/min (ref >60)
GFR calc non Af Amer: >60 mL/min (ref >60)
Glucose, Bld: 101 mg/dL — ABNORMAL HIGH (ref 70–99)
Glucose, Bld: 113 mg/dL — ABNORMAL HIGH (ref 70–99)
Glucose, Bld: 118 mg/dL — ABNORMAL HIGH (ref 70–99)
Potassium: 3.6 mEq/L (ref 3.5–5.1)
Potassium: 4.1 mEq/L (ref 3.5–5.1)
Potassium: 4.3 mEq/L (ref 3.5–5.1)
Potassium: 4.4 mEq/L (ref 3.5–5.1)
Potassium: 4.5 mEq/L (ref 3.5–5.1)
Potassium: 4.7 mEq/L (ref 3.5–5.1)
Sodium: 129 mEq/L — ABNORMAL LOW (ref 135–145)
Sodium: 133 mEq/L — ABNORMAL LOW (ref 135–145)
Sodium: 134 mEq/L — ABNORMAL LOW (ref 135–145)
Sodium: 135 mEq/L (ref 135–145)
Sodium: 135 mEq/L (ref 135–145)
Sodium: 137 mEq/L (ref 135–145)

## 2010-11-21 LAB — DRUGS OF ABUSE SCREEN W/O ALC, ROUTINE URINE
Amphetamine Screen, Ur: NEGATIVE
Benzodiazepines.: NEGATIVE
Cocaine Metabolites: NEGATIVE
Creatinine,U: 234.7 mg/dL
Marijuana Metabolite: NEGATIVE
Opiate Screen, Urine: NEGATIVE
Phencyclidine (PCP): NEGATIVE
Propoxyphene: NEGATIVE

## 2010-11-21 LAB — POCT I-STAT, CHEM 8
BUN: 9 mg/dL (ref 6–23)
Creatinine, Ser: 0.9 mg/dL (ref 0.4–1.5)
Hemoglobin: 11.2 g/dL — ABNORMAL LOW (ref 13.0–17.0)
Potassium: 3.6 mEq/L (ref 3.5–5.1)
Sodium: 138 mEq/L (ref 135–145)

## 2010-11-21 LAB — DIFFERENTIAL
Basophils Relative: 1 % (ref 0–1)
Eosinophils Relative: 3 % (ref 0–5)
Lymphocytes Relative: 16 % (ref 12–46)
Lymphocytes Relative: 19 % (ref 12–46)
Lymphocytes Relative: 21 % (ref 12–46)
Lymphs Abs: 1.3 10*3/uL (ref 0.7–4.0)
Lymphs Abs: 1.5 10*3/uL (ref 0.7–4.0)
Lymphs Abs: 1.6 10*3/uL (ref 0.7–4.0)
Monocytes Absolute: 0.7 10*3/uL (ref 0.1–1.0)
Monocytes Relative: 10 % (ref 3–12)
Monocytes Relative: 9 % (ref 3–12)
Neutro Abs: 5.1 10*3/uL (ref 1.7–7.7)
Neutro Abs: 5.5 10*3/uL (ref 1.7–7.7)
Neutro Abs: 5.8 10*3/uL (ref 1.7–7.7)
Neutrophils Relative %: 70 % (ref 43–77)
Neutrophils Relative %: 73 % (ref 43–77)

## 2010-11-21 LAB — APTT
aPTT: 27 seconds (ref 24–37)
aPTT: 29 seconds (ref 24–37)

## 2010-11-21 LAB — CBC
HCT: 30.7 % — ABNORMAL LOW (ref 39.0–52.0)
HCT: 33 % — ABNORMAL LOW (ref 39.0–52.0)
HCT: 36 % — ABNORMAL LOW (ref 39.0–52.0)
Hemoglobin: 10.6 g/dL — ABNORMAL LOW (ref 13.0–17.0)
Hemoglobin: 12.3 g/dL — ABNORMAL LOW (ref 13.0–17.0)
MCHC: 34.7 g/dL (ref 30.0–36.0)
MCV: 86 fL (ref 78.0–100.0)
MCV: 86.6 fL (ref 78.0–100.0)
Platelets: 264 10*3/uL (ref 150–400)
Platelets: 289 10*3/uL (ref 150–400)
RBC: 3.82 MIL/uL — ABNORMAL LOW (ref 4.22–5.81)
RBC: 4.17 MIL/uL — ABNORMAL LOW (ref 4.22–5.81)
RDW: 14.1 % (ref 11.5–15.5)
WBC: 7.2 10*3/uL (ref 4.0–10.5)
WBC: 7.9 10*3/uL (ref 4.0–10.5)

## 2010-11-21 LAB — COMPREHENSIVE METABOLIC PANEL
BUN: 9 mg/dL (ref 6–23)
CO2: 27 mEq/L (ref 19–32)
Calcium: 8.1 mg/dL — ABNORMAL LOW (ref 8.4–10.5)
GFR calc non Af Amer: >60 mL/min (ref >60)
Glucose, Bld: 108 mg/dL — ABNORMAL HIGH (ref 70–99)
Total Protein: 6.2 g/dL (ref 6.0–8.3)

## 2010-11-21 LAB — URINE MICROSCOPIC-ADD ON

## 2010-11-21 LAB — CULTURE, BLOOD (ROUTINE X 2)
Culture: NO GROWTH
Culture: NO GROWTH

## 2010-11-21 LAB — URINALYSIS, ROUTINE W REFLEX MICROSCOPIC
Glucose, UA: NEGATIVE mg/dL
Protein, ur: 100 mg/dL — AB

## 2010-11-21 LAB — URINE CULTURE: Colony Count: NO GROWTH

## 2010-11-21 LAB — PROTIME-INR: INR: 1.2 (ref 0.00–1.49)

## 2010-11-21 LAB — HIGH SENSITIVITY CRP: CRP, High Sensitivity: 4.5 mg/L — ABNORMAL HIGH

## 2010-11-21 LAB — PROTEIN ELECTROPHORESIS, SERUM
Albumin ELP: 47 % — ABNORMAL LOW (ref 55.8–66.1)
Alpha-1-Globulin: 9.1 % — ABNORMAL HIGH (ref 2.9–4.9)
Beta 2: 6.6 % — ABNORMAL HIGH (ref 3.2–6.5)
Gamma Globulin: 18.3 % (ref 11.1–18.8)

## 2010-11-21 LAB — LIPID PANEL
Cholesterol: 112 mg/dL (ref 0–200)
HDL: 18 mg/dL — ABNORMAL LOW (ref >39)
LDL Cholesterol: 72 mg/dL (ref 0–99)
Total CHOL/HDL Ratio: 6.2 RATIO

## 2010-11-21 LAB — HIV ANTIBODY (ROUTINE TESTING W REFLEX): HIV: NONREACTIVE

## 2010-11-21 LAB — ANTI-NEUTROPHIL ANTIBODY

## 2010-11-21 LAB — C3 COMPLEMENT: C3 Complement: 179 mg/dL (ref 88–201)

## 2010-11-21 LAB — ANA: Anti Nuclear Antibody(ANA): POSITIVE — AB

## 2010-11-21 LAB — MRSA PCR SCREENING
MRSA by PCR: NEGATIVE
MRSA by PCR: NEGATIVE

## 2010-11-21 LAB — RPR: RPR Ser Ql: NONREACTIVE

## 2010-11-21 LAB — C4 COMPLEMENT: Complement C4, Body Fluid: 23 mg/dL (ref 16–47)

## 2010-12-13 LAB — POCT I-STAT, CHEM 8
BUN: 44 mg/dL — ABNORMAL HIGH (ref 6–23)
Calcium, Ion: 1.04 mmol/L — ABNORMAL LOW (ref 1.12–1.32)
Chloride: 113 mEq/L — ABNORMAL HIGH (ref 96–112)
Glucose, Bld: 101 mg/dL — ABNORMAL HIGH (ref 70–99)

## 2010-12-13 LAB — CULTURE, BLOOD (ROUTINE X 2): Culture: NO GROWTH

## 2010-12-13 LAB — DIFFERENTIAL
Eosinophils Absolute: 0.3 10*3/uL (ref 0.0–0.7)
Eosinophils Relative: 2 % (ref 0–5)
Lymphs Abs: 2.4 10*3/uL (ref 0.7–4.0)
Monocytes Relative: 6 % (ref 3–12)

## 2010-12-13 LAB — BASIC METABOLIC PANEL
BUN: 37 mg/dL — ABNORMAL HIGH (ref 6–23)
BUN: 49 mg/dL — ABNORMAL HIGH (ref 6–23)
BUN: 60 mg/dL — ABNORMAL HIGH (ref 6–23)
CO2: 25 mEq/L (ref 19–32)
Calcium: 8.5 mg/dL (ref 8.4–10.5)
Calcium: 9 mg/dL (ref 8.4–10.5)
Calcium: 9.1 mg/dL (ref 8.4–10.5)
Chloride: 109 mEq/L (ref 96–112)
Creatinine, Ser: 1.46 mg/dL (ref 0.4–1.5)
GFR calc non Af Amer: 30 mL/min — ABNORMAL LOW (ref >60)
GFR calc non Af Amer: 43 mL/min — ABNORMAL LOW (ref >60)
GFR calc non Af Amer: 49 mL/min — ABNORMAL LOW (ref >60)
GFR calc non Af Amer: >60 mL/min (ref >60)
Glucose, Bld: 112 mg/dL — ABNORMAL HIGH (ref 70–99)
Glucose, Bld: 115 mg/dL — ABNORMAL HIGH (ref 70–99)
Glucose, Bld: 117 mg/dL — ABNORMAL HIGH (ref 70–99)
Glucose, Bld: 94 mg/dL (ref 70–99)
Potassium: 3.9 mEq/L (ref 3.5–5.1)
Potassium: 4.9 mEq/L (ref 3.5–5.1)
Sodium: 139 mEq/L (ref 135–145)
Sodium: 142 mEq/L (ref 135–145)

## 2010-12-13 LAB — GRAM STAIN

## 2010-12-13 LAB — CBC
HCT: 35.1 % — ABNORMAL LOW (ref 39.0–52.0)
HCT: 39.6 % (ref 39.0–52.0)
Hemoglobin: 12 g/dL — ABNORMAL LOW (ref 13.0–17.0)
MCV: 89 fL (ref 78.0–100.0)
Platelets: 280 10*3/uL (ref 150–400)
RDW: 17 % — ABNORMAL HIGH (ref 11.5–15.5)
WBC: 11 10*3/uL — ABNORMAL HIGH (ref 4.0–10.5)
WBC: 13.9 10*3/uL — ABNORMAL HIGH (ref 4.0–10.5)

## 2010-12-13 LAB — COMPREHENSIVE METABOLIC PANEL
ALT: 38 U/L (ref 0–53)
AST: 38 U/L — ABNORMAL HIGH (ref 0–37)
Albumin: 2.7 g/dL — ABNORMAL LOW (ref 3.5–5.2)
Alkaline Phosphatase: 170 U/L — ABNORMAL HIGH (ref 39–117)
BUN: 41 mg/dL — ABNORMAL HIGH (ref 6–23)
GFR calc Af Amer: >60 mL/min (ref >60)
Potassium: 4.3 mEq/L (ref 3.5–5.1)
Sodium: 140 mEq/L (ref 135–145)
Total Protein: 6.2 g/dL (ref 6.0–8.3)

## 2010-12-13 LAB — PHOSPHORUS
Phosphorus: 5.1 mg/dL — ABNORMAL HIGH (ref 2.3–4.6)
Phosphorus: 5.4 mg/dL — ABNORMAL HIGH (ref 2.3–4.6)

## 2010-12-13 LAB — VARICELLA-ZOSTER BY PCR: Varicella-Zoster, PCR: NOT DETECTED

## 2010-12-13 LAB — LIPID PANEL
HDL: 23 mg/dL — ABNORMAL LOW (ref >39)
Triglycerides: 171 mg/dL — ABNORMAL HIGH (ref <150)
VLDL: 34 mg/dL (ref 0–40)

## 2010-12-13 LAB — CSF CELL COUNT WITH DIFFERENTIAL
RBC Count, CSF: 0 /mm3
WBC, CSF: 1 /mm3 (ref 0–5)

## 2010-12-13 LAB — PROTEIN, CSF: Total  Protein, CSF: 30 mg/dL (ref 15–45)

## 2010-12-13 LAB — GLUCOSE, CSF: Glucose, CSF: 60 mg/dL (ref 43–76)

## 2010-12-13 LAB — VDRL, CSF: VDRL Quant, CSF: NONREACTIVE

## 2010-12-13 LAB — PROTIME-INR: Prothrombin Time: 14.2 seconds (ref 11.6–15.2)

## 2010-12-14 LAB — D-DIMER, QUANTITATIVE: D-Dimer, Quant: 2.36 ug/mL-FEU — ABNORMAL HIGH (ref 0.00–0.48)

## 2010-12-14 LAB — URINE MICROSCOPIC-ADD ON

## 2010-12-14 LAB — CBC
HCT: 24.6 % — ABNORMAL LOW (ref 39.0–52.0)
HCT: 23.7 % — ABNORMAL LOW (ref 39.0–52.0)
HCT: 24.9 % — ABNORMAL LOW (ref 39.0–52.0)
HCT: 25.8 % — ABNORMAL LOW (ref 39.0–52.0)
HCT: 26 % — ABNORMAL LOW (ref 39.0–52.0)
HCT: 26.9 % — ABNORMAL LOW (ref 39.0–52.0)
HCT: 27.4 % — ABNORMAL LOW (ref 39.0–52.0)
Hemoglobin: 8.7 g/dL — ABNORMAL LOW (ref 13.0–17.0)
Hemoglobin: 8.4 g/dL — ABNORMAL LOW (ref 13.0–17.0)
Hemoglobin: 8.7 g/dL — ABNORMAL LOW (ref 13.0–17.0)
Hemoglobin: 8.9 g/dL — ABNORMAL LOW (ref 13.0–17.0)
Hemoglobin: 9.1 g/dL — ABNORMAL LOW (ref 13.0–17.0)
Hemoglobin: 9.2 g/dL — ABNORMAL LOW (ref 13.0–17.0)
MCHC: 35.3 g/dL (ref 30.0–36.0)
MCHC: 35.7 g/dL (ref 30.0–36.0)
MCHC: 34.7 g/dL (ref 30.0–36.0)
MCHC: 34.9 g/dL (ref 30.0–36.0)
MCHC: 35.1 g/dL (ref 30.0–36.0)
MCHC: 35.1 g/dL (ref 30.0–36.0)
MCHC: 35.3 g/dL (ref 30.0–36.0)
MCHC: 35.4 g/dL (ref 30.0–36.0)
MCHC: 35.6 g/dL (ref 30.0–36.0)
MCHC: 35.8 g/dL (ref 30.0–36.0)
MCV: 88 fL (ref 78.0–100.0)
MCV: 89.4 fL (ref 78.0–100.0)
MCV: 83.6 fL (ref 78.0–100.0)
MCV: 83.9 fL (ref 78.0–100.0)
MCV: 84.5 fL (ref 78.0–100.0)
MCV: 85.2 fL (ref 78.0–100.0)
MCV: 87.5 fL (ref 78.0–100.0)
MCV: 87.6 fL (ref 78.0–100.0)
MCV: 87.9 fL (ref 78.0–100.0)
MCV: 88.7 fL (ref 78.0–100.0)
Platelets: 212 10*3/uL (ref 150–400)
Platelets: 239 10*3/uL (ref 150–400)
Platelets: 191 10*3/uL (ref 150–400)
Platelets: 199 10*3/uL (ref 150–400)
Platelets: 218 10*3/uL (ref 150–400)
Platelets: 220 10*3/uL (ref 150–400)
Platelets: 235 10*3/uL (ref 150–400)
Platelets: 256 10*3/uL (ref 150–400)
Platelets: 257 10*3/uL (ref 150–400)
Platelets: 270 10*3/uL (ref 150–400)
RBC: 2.86 MIL/uL — ABNORMAL LOW (ref 4.22–5.81)
RBC: 2.92 MIL/uL — ABNORMAL LOW (ref 4.22–5.81)
RBC: 2.93 MIL/uL — ABNORMAL LOW (ref 4.22–5.81)
RBC: 3.02 MIL/uL — ABNORMAL LOW (ref 4.22–5.81)
RBC: 3.04 MIL/uL — ABNORMAL LOW (ref 4.22–5.81)
RBC: 3.21 MIL/uL — ABNORMAL LOW (ref 4.22–5.81)
RDW: 15.4 % (ref 11.5–15.5)
RDW: 16 % — ABNORMAL HIGH (ref 11.5–15.5)
RDW: 15 % (ref 11.5–15.5)
RDW: 15.1 % (ref 11.5–15.5)
RDW: 15.2 % (ref 11.5–15.5)
RDW: 15.3 % (ref 11.5–15.5)
RDW: 15.4 % (ref 11.5–15.5)
RDW: 15.4 % (ref 11.5–15.5)
RDW: 15.7 % — ABNORMAL HIGH (ref 11.5–15.5)
WBC: 7.8 10*3/uL (ref 4.0–10.5)
WBC: 13 10*3/uL — ABNORMAL HIGH (ref 4.0–10.5)
WBC: 13 10*3/uL — ABNORMAL HIGH (ref 4.0–10.5)
WBC: 6.8 10*3/uL (ref 4.0–10.5)
WBC: 7.1 10*3/uL (ref 4.0–10.5)
WBC: 7.6 10*3/uL (ref 4.0–10.5)

## 2010-12-14 LAB — CRYOGLOBULIN

## 2010-12-14 LAB — RENAL FUNCTION PANEL
Albumin: 1.3 g/dL — ABNORMAL LOW (ref 3.5–5.2)
Albumin: 1.3 g/dL — ABNORMAL LOW (ref 3.5–5.2)
BUN: 51 mg/dL — ABNORMAL HIGH (ref 6–23)
BUN: 61 mg/dL — ABNORMAL HIGH (ref 6–23)
CO2: 30 mEq/L (ref 19–32)
CO2: 28 mEq/L (ref 19–32)
Calcium: 7.7 mg/dL — ABNORMAL LOW (ref 8.4–10.5)
Calcium: 8 mg/dL — ABNORMAL LOW (ref 8.4–10.5)
Chloride: 109 mEq/L (ref 96–112)
Creatinine, Ser: 2.11 mg/dL — ABNORMAL HIGH (ref 0.4–1.5)
Creatinine, Ser: 2.47 mg/dL — ABNORMAL HIGH (ref 0.4–1.5)
Creatinine, Ser: 2.5 mg/dL — ABNORMAL HIGH (ref 0.4–1.5)
GFR calc Af Amer: 32 mL/min — ABNORMAL LOW (ref >60)
GFR calc non Af Amer: 26 mL/min — ABNORMAL LOW (ref >60)
Glucose, Bld: 93 mg/dL (ref 70–99)
Glucose, Bld: 117 mg/dL — ABNORMAL HIGH (ref 70–99)
Phosphorus: 4.1 mg/dL (ref 2.3–4.6)
Phosphorus: 5.5 mg/dL — ABNORMAL HIGH (ref 2.3–4.6)
Potassium: 4 mEq/L (ref 3.5–5.1)

## 2010-12-14 LAB — BASIC METABOLIC PANEL
BUN: 47 mg/dL — ABNORMAL HIGH (ref 6–23)
BUN: 66 mg/dL — ABNORMAL HIGH (ref 6–23)
BUN: 66 mg/dL — ABNORMAL HIGH (ref 6–23)
BUN: 70 mg/dL — ABNORMAL HIGH (ref 6–23)
BUN: 72 mg/dL — ABNORMAL HIGH (ref 6–23)
BUN: 74 mg/dL — ABNORMAL HIGH (ref 6–23)
BUN: 78 mg/dL — ABNORMAL HIGH (ref 6–23)
BUN: 79 mg/dL — ABNORMAL HIGH (ref 6–23)
CO2: 29 mEq/L (ref 19–32)
CO2: 29 mEq/L (ref 19–32)
CO2: 16 mEq/L — ABNORMAL LOW (ref 19–32)
CO2: 18 mEq/L — ABNORMAL LOW (ref 19–32)
CO2: 20 mEq/L (ref 19–32)
CO2: 20 mEq/L (ref 19–32)
CO2: 22 mEq/L (ref 19–32)
Calcium: 8.3 mg/dL — ABNORMAL LOW (ref 8.4–10.5)
Calcium: 8.5 mg/dL (ref 8.4–10.5)
Calcium: 7 mg/dL — ABNORMAL LOW (ref 8.4–10.5)
Calcium: 7.3 mg/dL — ABNORMAL LOW (ref 8.4–10.5)
Calcium: 7.6 mg/dL — ABNORMAL LOW (ref 8.4–10.5)
Calcium: 7.7 mg/dL — ABNORMAL LOW (ref 8.4–10.5)
Calcium: 7.8 mg/dL — ABNORMAL LOW (ref 8.4–10.5)
Chloride: 103 mEq/L (ref 96–112)
Chloride: 106 mEq/L (ref 96–112)
Chloride: 109 mEq/L (ref 96–112)
Chloride: 110 mEq/L (ref 96–112)
Chloride: 110 mEq/L (ref 96–112)
Chloride: 111 mEq/L (ref 96–112)
Creatinine, Ser: 1.99 mg/dL — ABNORMAL HIGH (ref 0.4–1.5)
Creatinine, Ser: 2.46 mg/dL — ABNORMAL HIGH (ref 0.4–1.5)
Creatinine, Ser: 2.88 mg/dL — ABNORMAL HIGH (ref 0.4–1.5)
Creatinine, Ser: 3.09 mg/dL — ABNORMAL HIGH (ref 0.4–1.5)
Creatinine, Ser: 3.13 mg/dL — ABNORMAL HIGH (ref 0.4–1.5)
Creatinine, Ser: 3.27 mg/dL — ABNORMAL HIGH (ref 0.4–1.5)
Creatinine, Ser: 3.46 mg/dL — ABNORMAL HIGH (ref 0.4–1.5)
GFR calc Af Amer: 23 mL/min — ABNORMAL LOW (ref >60)
GFR calc Af Amer: 24 mL/min — ABNORMAL LOW (ref >60)
GFR calc Af Amer: 26 mL/min — ABNORMAL LOW (ref >60)
GFR calc Af Amer: 27 mL/min — ABNORMAL LOW (ref >60)
GFR calc non Af Amer: 18 mL/min — ABNORMAL LOW (ref >60)
GFR calc non Af Amer: 19 mL/min — ABNORMAL LOW (ref >60)
GFR calc non Af Amer: 20 mL/min — ABNORMAL LOW (ref >60)
GFR calc non Af Amer: 21 mL/min — ABNORMAL LOW (ref >60)
GFR calc non Af Amer: 22 mL/min — ABNORMAL LOW (ref >60)
GFR calc non Af Amer: 23 mL/min — ABNORMAL LOW (ref >60)
Glucose, Bld: 111 mg/dL — ABNORMAL HIGH (ref 70–99)
Glucose, Bld: 112 mg/dL — ABNORMAL HIGH (ref 70–99)
Glucose, Bld: 100 mg/dL — ABNORMAL HIGH (ref 70–99)
Glucose, Bld: 102 mg/dL — ABNORMAL HIGH (ref 70–99)
Glucose, Bld: 108 mg/dL — ABNORMAL HIGH (ref 70–99)
Glucose, Bld: 108 mg/dL — ABNORMAL HIGH (ref 70–99)
Glucose, Bld: 111 mg/dL — ABNORMAL HIGH (ref 70–99)
Glucose, Bld: 89 mg/dL (ref 70–99)
Glucose, Bld: 94 mg/dL (ref 70–99)
Potassium: 3.9 mEq/L (ref 3.5–5.1)
Potassium: 4.3 mEq/L (ref 3.5–5.1)
Potassium: 3.2 mEq/L — ABNORMAL LOW (ref 3.5–5.1)
Potassium: 3.3 mEq/L — ABNORMAL LOW (ref 3.5–5.1)
Potassium: 3.3 mEq/L — ABNORMAL LOW (ref 3.5–5.1)
Potassium: 3.4 mEq/L — ABNORMAL LOW (ref 3.5–5.1)
Potassium: 3.5 mEq/L (ref 3.5–5.1)
Sodium: 141 mEq/L (ref 135–145)
Sodium: 141 mEq/L (ref 135–145)
Sodium: 135 mEq/L (ref 135–145)
Sodium: 135 mEq/L (ref 135–145)
Sodium: 136 mEq/L (ref 135–145)
Sodium: 138 mEq/L (ref 135–145)

## 2010-12-14 LAB — GLUCOSE, CAPILLARY
Glucose-Capillary: 100 mg/dL — ABNORMAL HIGH (ref 70–99)
Glucose-Capillary: 100 mg/dL — ABNORMAL HIGH (ref 70–99)
Glucose-Capillary: 102 mg/dL — ABNORMAL HIGH (ref 70–99)
Glucose-Capillary: 107 mg/dL — ABNORMAL HIGH (ref 70–99)
Glucose-Capillary: 108 mg/dL — ABNORMAL HIGH (ref 70–99)
Glucose-Capillary: 110 mg/dL — ABNORMAL HIGH (ref 70–99)
Glucose-Capillary: 110 mg/dL — ABNORMAL HIGH (ref 70–99)
Glucose-Capillary: 114 mg/dL — ABNORMAL HIGH (ref 70–99)
Glucose-Capillary: 119 mg/dL — ABNORMAL HIGH (ref 70–99)
Glucose-Capillary: 125 mg/dL — ABNORMAL HIGH (ref 70–99)
Glucose-Capillary: 141 mg/dL — ABNORMAL HIGH (ref 70–99)
Glucose-Capillary: 142 mg/dL — ABNORMAL HIGH (ref 70–99)
Glucose-Capillary: 153 mg/dL — ABNORMAL HIGH (ref 70–99)
Glucose-Capillary: 275 mg/dL — ABNORMAL HIGH (ref 70–99)
Glucose-Capillary: 84 mg/dL (ref 70–99)
Glucose-Capillary: 85 mg/dL (ref 70–99)
Glucose-Capillary: 85 mg/dL (ref 70–99)
Glucose-Capillary: 89 mg/dL (ref 70–99)
Glucose-Capillary: 89 mg/dL (ref 70–99)
Glucose-Capillary: 90 mg/dL (ref 70–99)
Glucose-Capillary: 90 mg/dL (ref 70–99)
Glucose-Capillary: 91 mg/dL (ref 70–99)
Glucose-Capillary: 92 mg/dL (ref 70–99)
Glucose-Capillary: 94 mg/dL (ref 70–99)
Glucose-Capillary: 96 mg/dL (ref 70–99)
Glucose-Capillary: 98 mg/dL (ref 70–99)

## 2010-12-14 LAB — CROSSMATCH

## 2010-12-14 LAB — DIFFERENTIAL
Basophils Relative: 0 % (ref 0–1)
Monocytes Absolute: 0.8 10*3/uL (ref 0.1–1.0)
Monocytes Relative: 6 % (ref 3–12)
Neutro Abs: 11.8 10*3/uL — ABNORMAL HIGH (ref 1.7–7.7)

## 2010-12-14 LAB — PHOSPHORUS
Phosphorus: 6.3 mg/dL — ABNORMAL HIGH (ref 2.3–4.6)
Phosphorus: 6.7 mg/dL — ABNORMAL HIGH (ref 2.3–4.6)

## 2010-12-14 LAB — MAGNESIUM
Magnesium: 2.6 mg/dL — ABNORMAL HIGH (ref 1.5–2.5)
Magnesium: 2.6 mg/dL — ABNORMAL HIGH (ref 1.5–2.5)
Magnesium: 2.6 mg/dL — ABNORMAL HIGH (ref 1.5–2.5)

## 2010-12-14 LAB — COMPREHENSIVE METABOLIC PANEL
ALT: 69 U/L — ABNORMAL HIGH (ref 0–53)
AST: 16 U/L (ref 0–37)
AST: 21 U/L (ref 0–37)
AST: 37 U/L (ref 0–37)
Albumin: 1.2 g/dL — ABNORMAL LOW (ref 3.5–5.2)
Albumin: 1.3 g/dL — ABNORMAL LOW (ref 3.5–5.2)
Albumin: 1.4 g/dL — ABNORMAL LOW (ref 3.5–5.2)
Alkaline Phosphatase: 165 U/L — ABNORMAL HIGH (ref 39–117)
Alkaline Phosphatase: 174 U/L — ABNORMAL HIGH (ref 39–117)
Alkaline Phosphatase: 182 U/L — ABNORMAL HIGH (ref 39–117)
BUN: 69 mg/dL — ABNORMAL HIGH (ref 6–23)
CO2: 16 mEq/L — ABNORMAL LOW (ref 19–32)
Calcium: 7.5 mg/dL — ABNORMAL LOW (ref 8.4–10.5)
Chloride: 109 mEq/L (ref 96–112)
Chloride: 109 mEq/L (ref 96–112)
GFR calc Af Amer: 21 mL/min — ABNORMAL LOW (ref >60)
GFR calc Af Amer: 24 mL/min — ABNORMAL LOW (ref >60)
GFR calc Af Amer: 26 mL/min — ABNORMAL LOW (ref >60)
GFR calc non Af Amer: 21 mL/min — ABNORMAL LOW (ref >60)
Potassium: 3 mEq/L — ABNORMAL LOW (ref 3.5–5.1)
Potassium: 3.4 mEq/L — ABNORMAL LOW (ref 3.5–5.1)
Potassium: 3.5 mEq/L (ref 3.5–5.1)
Sodium: 136 mEq/L (ref 135–145)
Total Bilirubin: 1 mg/dL (ref 0.3–1.2)
Total Bilirubin: 1.5 mg/dL — ABNORMAL HIGH (ref 0.3–1.2)
Total Protein: 5 g/dL — ABNORMAL LOW (ref 6.0–8.3)
Total Protein: 5.4 g/dL — ABNORMAL LOW (ref 6.0–8.3)

## 2010-12-14 LAB — BLOOD GAS, ARTERIAL
Acid-base deficit: 11.6 mmol/L — ABNORMAL HIGH (ref 0.0–2.0)
Drawn by: 277361
O2 Saturation: 97.3 %
Patient temperature: 98.6
TCO2: 15.6 mmol/L (ref 0–100)
pH, Arterial: 7.369 (ref 7.350–7.450)

## 2010-12-14 LAB — URINALYSIS, ROUTINE W REFLEX MICROSCOPIC
Glucose, UA: NEGATIVE mg/dL
Nitrite: NEGATIVE
Protein, ur: 100 mg/dL — AB
Specific Gravity, Urine: 1.021 (ref 1.005–1.030)
Urobilinogen, UA: 0.2 mg/dL (ref 0.0–1.0)
pH: 5.5 (ref 5.0–8.0)
pH: 5.5 (ref 5.0–8.0)

## 2010-12-14 LAB — APTT: aPTT: 49 seconds — ABNORMAL HIGH (ref 24–37)

## 2010-12-14 LAB — IRON AND TIBC
Saturation Ratios: 40 % (ref 20–55)
UIBC: 75 ug/dL

## 2010-12-14 LAB — CLOSTRIDIUM DIFFICILE EIA

## 2010-12-14 LAB — CREATININE CLEARANCE, URINE, 24 HOUR
Collection Interval-CRCL: 24 hours
Creatinine, Urine: 35 mg/dL
Urine Total Volume-CRCL: 3450 mL

## 2010-12-14 LAB — PROTEIN ELECTROPHORESIS, SERUM
Albumin ELP: 35.3 % — ABNORMAL LOW (ref 55.8–66.1)
Alpha-1-Globulin: 9.2 % — ABNORMAL HIGH (ref 2.9–4.9)
Alpha-2-Globulin: 13.2 % — ABNORMAL HIGH (ref 7.1–11.8)
Beta 2: 8.5 % — ABNORMAL HIGH (ref 3.2–6.5)
Gamma Globulin: 27.3 % — ABNORMAL HIGH (ref 11.1–18.8)

## 2010-12-14 LAB — PROTIME-INR
INR: 1.2 (ref 0.00–1.49)
Prothrombin Time: 15.2 seconds (ref 11.6–15.2)

## 2010-12-14 LAB — RETICULOCYTES
RBC.: 2.79 MIL/uL — ABNORMAL LOW (ref 4.22–5.81)
Retic Ct Pct: 0.9 % (ref 0.4–3.1)

## 2010-12-14 LAB — ANTI-NUCLEAR AB-TITER (ANA TITER): ANA Titer 1: NEGATIVE

## 2010-12-14 LAB — HEPATITIS PANEL, ACUTE
Hep A IgM: NEGATIVE
Hepatitis B Surface Ag: NEGATIVE

## 2010-12-14 LAB — ANA: Anti Nuclear Antibody(ANA): POSITIVE — AB

## 2010-12-14 LAB — TRIGLYCERIDES
Triglycerides: 157 mg/dL — ABNORMAL HIGH (ref <150)
Triglycerides: 136 mg/dL (ref <150)
Triglycerides: 172 mg/dL — ABNORMAL HIGH (ref <150)

## 2010-12-14 LAB — PLATELET FUNCTION ASSAY: Collagen / Epinephrine: <268 seconds — ABNORMAL HIGH (ref 0–180)

## 2010-12-14 LAB — LIPID PANEL
Cholesterol: 110 mg/dL (ref 0–200)
HDL: 11 mg/dL — ABNORMAL LOW (ref >39)

## 2010-12-14 LAB — FERRITIN: Ferritin: 392 ng/mL — ABNORMAL HIGH (ref 22–322)

## 2010-12-14 LAB — C4 COMPLEMENT: Complement C4, Body Fluid: 21 mg/dL (ref 16–47)

## 2010-12-14 LAB — ANTI-NEUTROPHIL ANTIBODY

## 2010-12-14 LAB — CREATININE, URINE, 24 HOUR
Creatinine, 24H Ur: 1208 mg/d (ref 800–2000)
Creatinine, Urine: 35 mg/dL

## 2010-12-14 LAB — FOLATE: Folate: 10.1 ng/mL

## 2010-12-14 LAB — BRAIN NATRIURETIC PEPTIDE: Pro B Natriuretic peptide (BNP): 928 pg/mL — ABNORMAL HIGH (ref 0.0–100.0)

## 2010-12-14 LAB — HIGH SENSITIVITY CRP: CRP, High Sensitivity: 116.9 mg/L — ABNORMAL HIGH

## 2010-12-14 LAB — CARDIAC PANEL(CRET KIN+CKTOT+MB+TROPI): Total CK: 23 U/L (ref 7–232)

## 2010-12-14 LAB — CREATININE, URINE, RANDOM: Creatinine, Urine: 53.4 mg/dL

## 2010-12-19 LAB — CBC
HCT: 30.9 % — ABNORMAL LOW (ref 39.0–52.0)
HCT: 31.4 % — ABNORMAL LOW (ref 39.0–52.0)
HCT: 32.8 % — ABNORMAL LOW (ref 39.0–52.0)
HCT: 32.9 % — ABNORMAL LOW (ref 39.0–52.0)
Hemoglobin: 10.3 g/dL — ABNORMAL LOW (ref 13.0–17.0)
Hemoglobin: 10.8 g/dL — ABNORMAL LOW (ref 13.0–17.0)
Hemoglobin: 10.9 g/dL — ABNORMAL LOW (ref 13.0–17.0)
Hemoglobin: 11.1 g/dL — ABNORMAL LOW (ref 13.0–17.0)
Hemoglobin: 11.5 g/dL — ABNORMAL LOW (ref 13.0–17.0)
MCHC: 34.6 g/dL (ref 30.0–36.0)
MCHC: 35.1 g/dL (ref 30.0–36.0)
MCHC: 35.5 g/dL (ref 30.0–36.0)
MCV: 83.6 fL (ref 78.0–100.0)
MCV: 84.2 fL (ref 78.0–100.0)
MCV: 84.5 fL (ref 78.0–100.0)
MCV: 84.5 fL (ref 78.0–100.0)
MCV: 84.8 fL (ref 78.0–100.0)
Platelets: 117 10*3/uL — ABNORMAL LOW (ref 150–400)
Platelets: 126 10*3/uL — ABNORMAL LOW (ref 150–400)
Platelets: 201 10*3/uL (ref 150–400)
Platelets: 42 10*3/uL — CL (ref 150–400)
RBC: 3.5 MIL/uL — ABNORMAL LOW (ref 4.22–5.81)
RBC: 3.66 MIL/uL — ABNORMAL LOW (ref 4.22–5.81)
RBC: 3.66 MIL/uL — ABNORMAL LOW (ref 4.22–5.81)
RBC: 3.88 MIL/uL — ABNORMAL LOW (ref 4.22–5.81)
RDW: 13.8 % (ref 11.5–15.5)
RDW: 13.9 % (ref 11.5–15.5)
RDW: 14.2 % (ref 11.5–15.5)
RDW: 14.6 % (ref 11.5–15.5)
RDW: 14.7 % (ref 11.5–15.5)
RDW: 14.9 % (ref 11.5–15.5)
WBC: 10 10*3/uL (ref 4.0–10.5)
WBC: 10.9 10*3/uL — ABNORMAL HIGH (ref 4.0–10.5)
WBC: 11.5 10*3/uL — ABNORMAL HIGH (ref 4.0–10.5)
WBC: 11.8 10*3/uL — ABNORMAL HIGH (ref 4.0–10.5)
WBC: 9.4 10*3/uL (ref 4.0–10.5)

## 2010-12-19 LAB — GLUCOSE, CAPILLARY
Comment 1: 9999999
Glucose-Capillary: 102 mg/dL — ABNORMAL HIGH (ref 70–99)
Glucose-Capillary: 106 mg/dL — ABNORMAL HIGH (ref 70–99)
Glucose-Capillary: 108 mg/dL — ABNORMAL HIGH (ref 70–99)
Glucose-Capillary: 112 mg/dL — ABNORMAL HIGH (ref 70–99)
Glucose-Capillary: 115 mg/dL — ABNORMAL HIGH (ref 70–99)
Glucose-Capillary: 116 mg/dL — ABNORMAL HIGH (ref 70–99)
Glucose-Capillary: 127 mg/dL — ABNORMAL HIGH (ref 70–99)
Glucose-Capillary: 129 mg/dL — ABNORMAL HIGH (ref 70–99)
Glucose-Capillary: 130 mg/dL — ABNORMAL HIGH (ref 70–99)
Glucose-Capillary: 141 mg/dL — ABNORMAL HIGH (ref 70–99)
Glucose-Capillary: 164 mg/dL — ABNORMAL HIGH (ref 70–99)
Glucose-Capillary: 205 mg/dL — ABNORMAL HIGH (ref 70–99)
Glucose-Capillary: 245 mg/dL — ABNORMAL HIGH (ref 70–99)
Glucose-Capillary: 245 mg/dL — ABNORMAL HIGH (ref 70–99)
Glucose-Capillary: 267 mg/dL — ABNORMAL HIGH (ref 70–99)
Glucose-Capillary: 80 mg/dL (ref 70–99)
Glucose-Capillary: 87 mg/dL (ref 70–99)
Glucose-Capillary: 94 mg/dL (ref 70–99)

## 2010-12-19 LAB — POCT I-STAT 3, ART BLOOD GAS (G3+)
Acid-base deficit: 10 mmol/L — ABNORMAL HIGH (ref 0.0–2.0)
Acid-base deficit: 3 mmol/L — ABNORMAL HIGH (ref 0.0–2.0)
Acid-base deficit: 4 mmol/L — ABNORMAL HIGH (ref 0.0–2.0)
Acid-base deficit: 5 mmol/L — ABNORMAL HIGH (ref 0.0–2.0)
Bicarbonate: 17.8 mEq/L — ABNORMAL LOW (ref 20.0–24.0)
Bicarbonate: 20.6 mEq/L (ref 20.0–24.0)
O2 Saturation: 100 %
O2 Saturation: 97 %
Patient temperature: 101
TCO2: 16 mmol/L (ref 0–100)
TCO2: 20 mmol/L (ref 0–100)
pCO2 arterial: 21.9 mmHg — ABNORMAL LOW (ref 35.0–45.0)
pCO2 arterial: 24.9 mmHg — ABNORMAL LOW (ref 35.0–45.0)
pCO2 arterial: 25.4 mmHg — ABNORMAL LOW (ref 35.0–45.0)
pCO2 arterial: 30.6 mmHg — ABNORMAL LOW (ref 35.0–45.0)
pCO2 arterial: 31.7 mmHg — ABNORMAL LOW (ref 35.0–45.0)
pH, Arterial: 7.392 (ref 7.350–7.450)
pH, Arterial: 7.414 (ref 7.350–7.450)
pH, Arterial: 7.421 (ref 7.350–7.450)
pH, Arterial: 7.447 (ref 7.350–7.450)
pH, Arterial: 7.467 — ABNORMAL HIGH (ref 7.350–7.450)
pO2, Arterial: 103 mmHg — ABNORMAL HIGH (ref 80.0–100.0)
pO2, Arterial: 121 mmHg — ABNORMAL HIGH (ref 80.0–100.0)
pO2, Arterial: 153 mmHg — ABNORMAL HIGH (ref 80.0–100.0)
pO2, Arterial: 98 mmHg (ref 80.0–100.0)

## 2010-12-19 LAB — CSF CULTURE W GRAM STAIN

## 2010-12-19 LAB — CLOSTRIDIUM DIFFICILE EIA

## 2010-12-19 LAB — CULTURE, BLOOD (ROUTINE X 2): Culture: NO GROWTH

## 2010-12-19 LAB — CARDIAC PANEL(CRET KIN+CKTOT+MB+TROPI)
CK, MB: 10 ng/mL — ABNORMAL HIGH (ref 0.3–4.0)
Relative Index: 2.9 — ABNORMAL HIGH (ref 0.0–2.5)
Total CK: 222 U/L (ref 7–232)
Total CK: 305 U/L — ABNORMAL HIGH (ref 7–232)
Total CK: 308 U/L — ABNORMAL HIGH (ref 7–232)
Troponin I: 0.32 ng/mL — ABNORMAL HIGH (ref 0.00–0.06)

## 2010-12-19 LAB — CRYPTOCOCCAL ANTIGEN, CSF: Crypto Ag: NEGATIVE

## 2010-12-19 LAB — RENAL FUNCTION PANEL
Albumin: 1.9 g/dL — ABNORMAL LOW (ref 3.5–5.2)
Albumin: 1.9 g/dL — ABNORMAL LOW (ref 3.5–5.2)
Albumin: 2 g/dL — ABNORMAL LOW (ref 3.5–5.2)
BUN: 60 mg/dL — ABNORMAL HIGH (ref 6–23)
BUN: 69 mg/dL — ABNORMAL HIGH (ref 6–23)
CO2: 23 mEq/L (ref 19–32)
Chloride: 116 mEq/L — ABNORMAL HIGH (ref 96–112)
Chloride: 117 mEq/L — ABNORMAL HIGH (ref 96–112)
Chloride: 122 mEq/L — ABNORMAL HIGH (ref 96–112)
Creatinine, Ser: 1.53 mg/dL — ABNORMAL HIGH (ref 0.4–1.5)
Creatinine, Ser: 1.57 mg/dL — ABNORMAL HIGH (ref 0.4–1.5)
Creatinine, Ser: 1.89 mg/dL — ABNORMAL HIGH (ref 0.4–1.5)
GFR calc Af Amer: 54 mL/min — ABNORMAL LOW (ref >60)
GFR calc non Af Amer: 45 mL/min — ABNORMAL LOW (ref >60)
Glucose, Bld: 148 mg/dL — ABNORMAL HIGH (ref 70–99)
Glucose, Bld: 294 mg/dL — ABNORMAL HIGH (ref 70–99)
Phosphorus: 4.5 mg/dL (ref 2.3–4.6)
Potassium: 3.7 mEq/L (ref 3.5–5.1)
Sodium: 145 mEq/L (ref 135–145)

## 2010-12-19 LAB — BLOOD GAS, ARTERIAL
Acid-base deficit: 2.5 mmol/L — ABNORMAL HIGH (ref 0.0–2.0)
Bicarbonate: 20.4 mEq/L (ref 20.0–24.0)
TCO2: 21.3 mmol/L (ref 0–100)
pCO2 arterial: 27.5 mmHg — ABNORMAL LOW (ref 35.0–45.0)
pH, Arterial: 7.483 — ABNORMAL HIGH (ref 7.350–7.450)

## 2010-12-19 LAB — DIFFERENTIAL
Basophils Absolute: 0 10*3/uL (ref 0.0–0.1)
Basophils Absolute: 0 10*3/uL (ref 0.0–0.1)
Basophils Relative: 0 % (ref 0–1)
Eosinophils Absolute: 0 10*3/uL (ref 0.0–0.7)
Eosinophils Absolute: 0 10*3/uL (ref 0.0–0.7)
Eosinophils Absolute: 0 10*3/uL (ref 0.0–0.7)
Eosinophils Relative: 0 % (ref 0–5)
Eosinophils Relative: 0 % (ref 0–5)
Eosinophils Relative: 0 % (ref 0–5)
Lymphs Abs: 0.9 10*3/uL (ref 0.7–4.0)
Monocytes Absolute: 0.5 10*3/uL (ref 0.1–1.0)
Monocytes Absolute: 1.3 10*3/uL — ABNORMAL HIGH (ref 0.1–1.0)
Monocytes Relative: 5 % (ref 3–12)

## 2010-12-19 LAB — BASIC METABOLIC PANEL
CO2: 17 mEq/L — ABNORMAL LOW (ref 19–32)
CO2: 19 mEq/L (ref 19–32)
CO2: 19 mEq/L (ref 19–32)
CO2: 20 mEq/L (ref 19–32)
Calcium: 7.8 mg/dL — ABNORMAL LOW (ref 8.4–10.5)
Calcium: 8 mg/dL — ABNORMAL LOW (ref 8.4–10.5)
Chloride: 103 mEq/L (ref 96–112)
Chloride: 107 mEq/L (ref 96–112)
Chloride: 112 mEq/L (ref 96–112)
Chloride: 113 mEq/L — ABNORMAL HIGH (ref 96–112)
Creatinine, Ser: 2.55 mg/dL — ABNORMAL HIGH (ref 0.4–1.5)
Creatinine, Ser: 3.64 mg/dL — ABNORMAL HIGH (ref 0.4–1.5)
Creatinine, Ser: 4.22 mg/dL — ABNORMAL HIGH (ref 0.4–1.5)
GFR calc Af Amer: 17 mL/min — ABNORMAL LOW (ref >60)
GFR calc Af Amer: 21 mL/min — ABNORMAL LOW (ref >60)
GFR calc Af Amer: 23 mL/min — ABNORMAL LOW (ref >60)
GFR calc Af Amer: 31 mL/min — ABNORMAL LOW (ref >60)
GFR calc Af Amer: 34 mL/min — ABNORMAL LOW (ref >60)
GFR calc non Af Amer: 14 mL/min — ABNORMAL LOW (ref >60)
Glucose, Bld: 113 mg/dL — ABNORMAL HIGH (ref 70–99)
Glucose, Bld: 165 mg/dL — ABNORMAL HIGH (ref 70–99)
Glucose, Bld: 80 mg/dL (ref 70–99)
Glucose, Bld: 90 mg/dL (ref 70–99)
Potassium: 4 mEq/L (ref 3.5–5.1)
Sodium: 137 mEq/L (ref 135–145)
Sodium: 140 mEq/L (ref 135–145)

## 2010-12-19 LAB — URINALYSIS, ROUTINE W REFLEX MICROSCOPIC
Bilirubin Urine: NEGATIVE
Glucose, UA: NEGATIVE mg/dL
Ketones, ur: NEGATIVE mg/dL
Specific Gravity, Urine: 1.016 (ref 1.005–1.030)
pH: 5 (ref 5.0–8.0)

## 2010-12-19 LAB — DIC (DISSEMINATED INTRAVASCULAR COAGULATION)PANEL
D-Dimer, Quant: 3.3 ug/mL-FEU — ABNORMAL HIGH (ref 0.00–0.48)
Fibrinogen: 621 mg/dL — ABNORMAL HIGH (ref 204–475)
Prothrombin Time: 16.7 seconds — ABNORMAL HIGH (ref 11.6–15.2)

## 2010-12-19 LAB — CSF CELL COUNT WITH DIFFERENTIAL
Lymphs, CSF: 23 % — ABNORMAL LOW (ref 40–80)
Tube #: 3

## 2010-12-19 LAB — PROTEIN AND GLUCOSE, CSF: Total  Protein, CSF: 73 mg/dL — ABNORMAL HIGH (ref 15–45)

## 2010-12-19 LAB — URINE MICROSCOPIC-ADD ON

## 2010-12-19 LAB — FOLATE RBC: RBC Folate: 729 ng/mL — ABNORMAL HIGH (ref 180–600)

## 2010-12-19 LAB — CULTURE, RESPIRATORY W GRAM STAIN

## 2010-12-19 LAB — PHOSPHORUS
Phosphorus: 3.4 mg/dL (ref 2.3–4.6)
Phosphorus: 4.1 mg/dL (ref 2.3–4.6)

## 2010-12-19 LAB — TSH: TSH: 0.549 u[IU]/mL (ref 0.350–4.500)

## 2010-12-19 LAB — ABO/RH: ABO/RH(D): A POS

## 2010-12-19 LAB — MAGNESIUM
Magnesium: 2.4 mg/dL (ref 1.5–2.5)
Magnesium: 2.5 mg/dL (ref 1.5–2.5)
Magnesium: 2.5 mg/dL (ref 1.5–2.5)
Magnesium: 2.6 mg/dL — ABNORMAL HIGH (ref 1.5–2.5)
Magnesium: 2.9 mg/dL — ABNORMAL HIGH (ref 1.5–2.5)

## 2010-12-19 LAB — HSV PCR: HSV, PCR: NOT DETECTED

## 2010-12-19 LAB — VANCOMYCIN, RANDOM: Vancomycin Rm: 20.3 ug/mL

## 2010-12-19 LAB — TRIGLYCERIDES: Triglycerides: 316 mg/dL — ABNORMAL HIGH (ref <150)

## 2010-12-19 LAB — BRAIN NATRIURETIC PEPTIDE: Pro B Natriuretic peptide (BNP): 372 pg/mL — ABNORMAL HIGH (ref 0.0–100.0)

## 2010-12-19 LAB — URINE CULTURE

## 2010-12-19 LAB — HEMOGLOBIN A1C: Mean Plasma Glucose: 128 mg/dL

## 2010-12-19 LAB — HIV ANTIBODY (ROUTINE TESTING W REFLEX): HIV: NONREACTIVE

## 2010-12-19 LAB — CHOLESTEROL, TOTAL: Cholesterol: 68 mg/dL (ref 0–200)

## 2011-01-16 NOTE — Consult Note (Signed)
NAMEJARVIN, CARDONA NO.:  [id number]   MEDICAL RECORD NO.:  [id number]          PATIENT TYPE:  INP   LOCATION:  2101                         FACILITY:  [place]   PHYSICIAN:  Princess Bruins. Hickling, M.D.DATE OF BIRTH:  [birth year]   DATE OF CONSULTATION:  DATE OF DISCHARGE:                                 CONSULTATION   CHIEF COMPLAINT:  Delirium.   HISTORY OF THE PRESENT CONDITION:  Barry Haley is a 65 year old  gentleman previously well until 4 days ago when he suffered nausea,  vomiting, and diarrhea.  He went to the [facility] Emergency Room  on Saturday and was told that he had the flu.  He was treated with  Flexeril and opiates and may also have been given antiemetic.  The  patient was somewhat quieter and withdrawn.  He was anorexic.   Early Monday morning around 2:00 a.m., the patient was found naked in  the bedroom by his wife.  He was combative, kicking, biting, agitated,  and needed to be restrained when EMS brought him in.  He was not  responsive to commands.   The patient was admitted to the Critical Care Service for evaluation.  We were asked to see him because of his delirium.   They identified a number of medical problems including hypertension,  hyperlipidemia, posttraumatic stress disorder, gastroesophageal reflux  disease, acute renal failure superimposed upon chronic renal failure,  thrombocytopenia, and a urine drug screen positive for opiates (narcotic  medications had been prescribed).   The patient's past medical history is as noted above.  He has been on  peritoneal dialysis for his renal failure.   His review of systems is remarkable for fever, thrombocytopenia, nausea  and vomiting, diarrhea, anorexia, agitation, and inability to  communicate.  Twelve-system review is otherwise negative.   PAST SURGICAL HISTORY:  Appendectomy.  He has also had implantation of a  peritoneal catheter.   His medications include:  1.  Quinapril 20 mg daily.  2. Zofran 4 mg as needed.  3. Lortab 1-2 tablets every 4-6 hours as needed.  4. Flexeril 10 mg daily.  5. Atenolol 50 mg daily.  6. Phenergan suppositories 25 mg rectally as needed.  7. Crestor 10 mg daily.   DRUG ALLERGIES:  None known.   Family history is noncontributory for this acute illness.   SOCIAL HISTORY:  The patient smokes one pack of cigarettes per day.  He  drinks alcohol occasionally.  He does not use illicit drugs.  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  He lives with his wife.  He is  a Manufacturing engineer.   PHYSICAL EXAMINATION:  VITAL SIGNS:  Today, blood pressure is 104/88,  resting pulse 89, respirations 20, oxygen saturation 98%, temperature  101, temperature max for the illness was 102.  EAR, NOSE, AND THROAT:  No bruits.  He has meningismus.  I found no  signs of infection in the head and neck.  LUNGS:  Clear.  HEART:  No murmurs.  Pulses normal.  ABDOMEN:  Soft.  Bowel sounds normal to diminished.  EXTREMITIES:  No edema.  NEUROLOGIC:  The patient is in delirium.  He is not following commands.  He is restless, moving his head from side to side, grimacing.  He has no  obvious seizure activity.  CRANIAL NERVES:  Round reactive pupils.  He is photophobic.  I was not  able to view his discs because he tightly clenched his eyes shut.  Symmetric facial strength midline tongue.  MOTOR:  He is moving all 4 extremities well.  Sensation withdrawal x4.  Deep tendon reflexes diminished.  The patient had bilateral flexor  plantar responses.   IMPRESSION:  Viral syndrome.  We will need to evaluate him for  meningoencephalitis.  I reviewed his CT scan from [facility] and  it shows remote left cerebellar infarction that is about a centimeter in  diameter.  He has thrombocytopenia with platelet count of 42,000.  He  has acute on chronic renal failure with a BUN of 76 and creatinine of  4.1.  CK is 308, but I think this may be  muscle rather than heart;  however, his troponin then was elevated at 0.32.  PT 16.4, INR 1.3.  Sodium 131, potassium 3.4, chloride 93, bicarbonate is 20.3, glucose  140.  Alkaline phosphatase 194, AST slightly elevated at 50, ALT  elevated to 64.  CK rose from 140-308.  Urine positive for opiates.   PLAN:  The patient needs to have an LP and a CT scan under sedation.  I  will help if I can.  He needs an EEG in the morning.  He needs to get  his platelets improved before the LP.  I have written orders for these  tests.  He will be followed up by my partners in my absence.  I have  discussed this thoroughly with Dr. Chase Caller.  I discussed this also  with Dr. Brett Fairy, my partner, who will take up ongoing care.      Princess Bruins. Gaynell Face, M.D.  Electronically Signed     WH[facility]/MEDQ  D:  10/25/2008  T:  10/26/2008  Job:  [id number]   cc:   Collene Gobble, MD  Carin Hock, MD

## 2011-01-16 NOTE — Discharge Summary (Signed)
NAMEHARLESS, LUMBARD NO.:  [id number]   MEDICAL RECORD NO.:  [id number]          PATIENT TYPE:  INP   LOCATION:  [id number]                         FACILITY:  [place]   PHYSICIAN:  Helen Hashimoto, MD    DATE OF BIRTH:  08/20/1946   DATE OF ADMISSION:  10/25/2008  DATE OF DISCHARGE:  11/19/2008                               DISCHARGE SUMMARY   Initial discharge summary was done on November 09, 2008.  Addendum was added  on November 15, 2008.  Actual discharge date is November 19, 2008.   FINAL DISCHARGE DIAGNOSES:  1. 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  2. Acute renal failure status post biopsy.  3. Metabolic acidosis secondary to acute renal failure.  4. Clostridium difficile colitis.  5. Anemia with positive guaiac stool from colitis.  6. Hypertension.  7. Anasarca.   FINAL DISCHARGE MEDICATIONS:  1. Aranesp 200 mcg subcutaneously every 2 weeks.  2. Lasix 80 mg p.o. twice daily.  3. Norvasc 10 mg once a day.  4. Toprol-XL 100 mg once a day.  5. K-Dur 20 mEq twice daily.  6. Ancef 1 g IV q.8 h until December 07, 2008.  7. Vancomycin 250 mg 4 times daily until December 07, 2008.  8. Crestor 10 mg once a day.  9. Hydrocodone APAP as needed.  10.Cyclobenzaprine as needed.  11.Promethazine as needed.   For detailed consultation and radiology studies, please see previously  dictated discharge summaries on March 9 and March 18.  Since that time,  the patient only had a chest x-ray on November 18, 2008 and it was  negative.  For detailed procedures, please see previously dictated  discharge summaries.  Since that time, the patient had an ultrasound-  guided left renal biopsy.   COURSE OF HOSPITALIZATION:  For detailed course of hospitalization,  please refer to previously dictated discharge summaries done on March 9  and March 18.  Since that time, the patient has remained stable.  The  patient has been followed by Nephrology every day.  His  last creatinine  at the time of discharge is 2.01.  The patient underwent ultrasound-  guided left renal biopsy on March 18, results are pending at this time.  The patient is supposed to follow with Dr. Justin Mend as an outpatient to  follow the results of biopsy and for further treatment.  Dr. Justin Mend  recommended to continue him on Lasix 80 mg twice daily and potassium  chloride will be added to his Lasix.  Regarding the MSSA bacteremia, ID  recommended to continue Ancef for total of 6 weeks.  The patient will be  on 1 g q.8 h up to December 07, 2008.  Nephrology recommended not to do PICC  line because of possible hemodialysis.  Recommendation is to do central  line which will be done today prior to his discharge.  The patient will  continue on p.o. vancomycin until April 6 for his C. diff colitis.  His  blood pressure medications have been adjusted on above-mentioned  medicine that include Lasix and Norvasc and  Toprol have given him a good  control of his blood pressure.   Otherwise, other medical conditions remained stable at the present time.  The patient will be discharged today in a stable condition and will be  followed by Dr. Justin Mend and primary physician.   Total assessment time is 40 minutes.      Helen Hashimoto, MD  Electronically Signed     NAE/MEDQ  D:  11/19/2008  T:  11/20/2008  Job:  [id number]

## 2011-01-16 NOTE — Discharge Summary (Signed)
Barry Haley, Barry Haley NO.:  [id number]   MEDICAL RECORD NO.:  [id number]          PATIENT TYPE:  INP   LOCATION:  3015                         FACILITY:  [facility]   PHYSICIAN:  Kieth Brightly, MDDATE OF BIRTH:  [birth year]   DATE OF ADMISSION:  12/08/2008  DATE OF DISCHARGE:                               DISCHARGE SUMMARY   PRIMARY CARE PHYSICIAN:  Dr. Lovette Cliche of [facility] located at [address] [place], [place], [place], phone number [phone] and fax [phone].   INFECTIOUS DISEASE:  Dr. Johnnye Sima.   NEUROLOGY:  Dr. Leonie Man.   CARDIOLOGY:  Dr. Rockey Situ of [facility].   REASON FOR ADMISSION:  Severe headache.   DISCHARGE DIAGNOSIS:  1. 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  2. Possible septic emboli to the brain causing infarction.  3. Prior history of methicillin sensitive Staph aureus bacteremia with      meningitis and septic shock.  4. Acute renal failure during this admission, possibly prerenal      secondary to excessive diuresis by Lasix, currently resolving.  5. Existing history of hypertension.  6. Existing history of Bell's palsy on the right side.  7. Prior history of Clostridium difficile colitis which has been      completely treated and resolved, no acute issues now.  8. Prior history of migraines.  9. Prior history of acute renal failure with septic shock, status post      intubation and extubation during the last admission.   MEDICATIONS:  Complete list of medications will be dictated at the time  of discharge.   CURRENT MEDICATIONS:  1. Ancef 4 grams IV q.8 hours to be given for a total of 6 weeks      starting December 09, 2008.  2. Aspirin 325 mg p.o. daily.  3. Norvasc 10 mg p.o. daily.  4. Neurontin 100 mg p.o. t.i.d.  5. Toprol XL 100 mg p.o. daily.  6. Nicotine patch 14 mg transdermally daily now, needs to be tapered      after 10 days.  7. Lovaza 2 grams  p.o. b.i.d.  8. Crestor 10 mg p.o. daily.  9. Protonix 40 mg p.o. daily.   HOSPITAL COURSE BY PROBLEMS:  1. Acute cerebral infarction.  The patient was admitted on April 7      complaining of headache.  He was found to have a right-sided facial      droop which is due to the existing diagnosis of Bell's palsy for      which he was treated with steroids and Zovirax completely as an      outpatient.  He had a lumbar puncture at the time of admission      which was negative for meningitis.  His MRA showed multiple small      acute infarcts in the left hemisphere and left cerebellum and a      single right frontal lesion was also seen.  In view of his previous      history of MSSA bacteremia, it was suspected that he could  have had      a septic emboli to the brain.  TEE was done by [facility]      [facility] by Dr. Rockey Situ which ruled out any existing valvular      diseases or any vegetations on the valves.  He was started on      aspirin.  The patient was on telemetry all this time and no      evidence of any arrhythmias was seen on telemetry other than      occasional PVCs.  The patient was evaluated by neurology again and      they suggested that he needs to have [place] event monitor placed      for the next 3 weeks and to be followed up by neurology as an      outpatient when further procedures and testing will be done as an      outpatient.  [place] appointment has been set up already for next      Monday, April 19 at 9:45 a.m. at [facility] at      [address] [place], phone number [phone].  2. Infectious disease consult was called in view that possible septic      emboli was thought.  He was seen by Dr. Johnnye Sima who suggested that      even though TEE is negative, the suspicion for septic emboli is      high and so to continue Ancef for a total of 6 weeks.  Current      blood cultures are negative and TEE is negative.  The patient is       continued on Ancef and has a PICC line and home care has been set      up for a total of 6 weeks of Ancef starting December 09, 2008 when he      was started on vancomycin which was has been changed to Ancef.  3.      History of hypertension.  Continue the patient on Norvasc and      Toprol XL.  He was on Lasix which has been discontinued.  3. Acute renal failure.  The patient's renal function was normal at      the time of admission with creatinine of 1.19.  During his stay,      the patient's creatinine slightly went up.  He was on Lasix 80 mg      b.i.d. which was the dose of Lasix which was set by nephrology at      the time of last prior discharge.  It was felt that the patient      does not have any evidence of fluid overload or edema.  It was felt      that the patient is possibly having a prerenal factor in this acute      renal failure.  So, Lasix has been stopped.  He has been put on IV      fluids.  I and O charting has been started.  Urine urinalysis for      FENA has been sent.  We will treat it as prerenal acute renal      failure for now.  If the patient's creatinine shows improvement by      tomorrow on IV fluids, he can be watched.  Otherwise, further renal      consult can be considered at that time.  4. Dyslipidemia.  Continue Crestor and Lovaza.  5.  Neuralgic pain on the right side of the scalp.  The patient started      having neuralgia on the right side of the scalp after he had the      Bell's palsy.  I suspect it could be due to herpes zoster related      to Bell's palsy and neuralgia could be a postherpetic neuralgia.  I      started him on 100 mg t.i.d. of gabapentin.  The patient's pain is      relieved now.  He will continue the medication for that.  PMD to      follow up with the further levels of medication necessities for      neuralgia.   DISPOSITION:  The patient will be discharged home with home health care  for IV antibiotics.  PT/OT evaluation has been  seen and the patient has  been suggested to have home health PT and OT.  The patient was  discharged to home on home health PT and OT.   INSTRUCTIONS AND FOLLOWUP:  1. Follow up with [facility] on December 20, 2008 at      9:45 a.m. for event monitor.  2. Call Dr. Leonie Man at [facility] [phone] for appointment      within the next 2 weeks.  3. Follow up with the primary care doctor, Dr. Lin Landsman within 1 week      for blood check for BMP.  4. 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  Results need to be followed by      either the PMD or neurologist.   Final list of medications and any changes will be dictated by the  discharging physician at the time of discharge.   A total of 45 minutes was spent on the discharge.      Kieth Brightly, MD  Electronically Signed     UT/MEDQ  D:  12/14/2008  T:  12/14/2008  Job:  [id number]   cc:   Minna Merritts, MD  Neta Ehlers, M.D.  Doroteo Bradford. Johnnye Sima, M.D.  Pramod P. Leonie Man, MD

## 2011-01-16 NOTE — Consult Note (Signed)
Barry Haley, Barry Haley NO.:  [id number]   MEDICAL RECORD NO.:  [id number]          PATIENT TYPE:  INP   LOCATION:  [id number]                         FACILITY:  [facility]   PHYSICIAN:  Sherril Croon, M.D.   DATE OF BIRTH:  Dec 11, 1945   DATE OF CONSULTATION:  11/08/2008  DATE OF DISCHARGE:                                 CONSULTATION   REASON FOR CONSULTATION:  Progressive renal failure.   HISTORY OF PRESENT ILLNESS:  This is a 65 year old male with past  medical history of hypertension and hyperlipidemia presenting initially  to outside hospital on October 23, 2008 with nausea, vomiting, and  diarrhea.  He was given symptomatic treatment for viral GI infection and  sent home.  He presented again to [facility] with altered mental  status, acute renal failure, and increased troponin.  On October 25, 2008, he was then transferred to [facility].  He was  found to have MSSA bacteremia and meningitis.  Cultures were positive  from [facility].  On presentation on October 25, 2008, he was in  acute renal failure with a creatinine of 4.1 which was the reason for  hypovolemia and continued ACE in the setting of hypovolemia.  Initially,  acute renal failure resolved with a creatinine of 1.5 with IV hydration  and resolution of his sepsis.  Creatinine began to trend upwards again  on October 31, 2008 and has continued to rise.  [facility]  was consulted to help with renal failure.   PAST MEDICAL HISTORY:  1. Hypertension.  2. Hyperlipidemia.  3. PTSD.  4. GERD.   SOCIAL HISTORY:  [name] lives in [place] with his wife.  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  He is a current Government social research officer.  He has a one-pack per day smoking habit.  He is an  occasional alcohol consumer.  He uses no illicit drugs.   FAMILY MEDICAL HISTORY:  Noncontributory.  No history of end-stage renal  disease.  No history of acute  coronary events.   REVIEW OF SYSTEMS:  GENERAL:  Negative for fevers or chills.  Positive  for weight change.  He is plus 14 kg during this hospitalization.  HEENT:  Positive for photophobia and positive for headache.  SKIN:  Negative for rash.  Negative for other lesions.  CARDIOPULMONARY:  Negative for chest pain.  Negative for shortness of breath.  GU:  Negative for dysuria.  Negative for frank hematuria.  NEUROPSYCH:  Positive for weakness and positive for anxiety.  GI:  Positive for  nausea and positive for diarrhea.  No recent bowel movement.   HOME MEDICATIONS:  1. Quinapril 20 mg daily.  2. Atenolol 50 mg daily.  3. Crestor 10 mg daily.   The patient is also taking Lortab, Flexeril, Zofran, and Phenergan for  symptomatic relief.   PHYSICAL EXAMINATION:  VITAL SIGNS:  Temperature 98.2, pulse 74, blood  pressure 164/75, and oxygen 97% on room air.  I's and O's, the patient  is positive approximately 600 mL over the past week.  Urine output  ranging between 15-100 mL/hour.  He does not have a Foley catheter and  this is difficult to estimate.  GENERAL:  The patient is in no acute distress.  HEENT:  Head:  Normocephalic and atraumatic.  Extraocular motion is  intact.  Pupils equal, round, and reactive to light.  Sclerae are clear.  Mucous membranes are dry.  NECK:  Supple.  There is no bruit.  No lymphadenopathy.  No JVD.  CARDIOVASCULAR:  Regular rate and rhythm.  S1 and S2 are audible.  There  is no murmur, rub, or gallop.  LUNGS:  Clear to auscultation bilaterally.  SKIN:  No rash, no lesions.  GI:  Abdomen is soft, nontender with normal bowel sounds.  There is no  rebound, no guarding.  Abdomen is distended.  GU:  The scrotum is severely swollen.  Penis also severely swollen and  painful.  EXTREMITIES:  There is +1 edema to the thighs bilaterally.  NEUROLOGIC:  The patient is alert and oriented x4.  Cranial nerves are  intact.  Neuro exam is nonfocal.   STUDIES:   Chest x-ray on November 07, 2008 showed cardiomegaly with  pulmonary edema, there is bibasilar air space disease, right greater  than left.  A 2-D echo shows an ejection fraction of 50% on October 28, 2008.   LABORATORY DATA:  White blood cells 13.4, hemoglobin 8.7, hematocrit  24.8, and platelets 281.  Sodium 135, potassium 3.1, chloride 110,  bicarb 14, BUN 70, creatinine 3.09, glucose 113, and calcium 7.8.  C-  diff is positive.  D-dimer is positive at 2.36.  BMP is 928.  UA from  November 02, 2008 showed large blood, negative protein, large leucocyte  esterase, 21-50 white blood cells, and 11-20 red blood cells.  Urine  sodium less than 10 and urine osmolality 377.  CMET on November 04, 2008  shows a bilirubin of 3.7, alk phos 165, and albumin of 1.4.  INR is 1.6.   ASSESSMENT AND PLAN:  Acute renal failure.  Agree that initial  presenting renal failure was secondary to hypovolemia and continued use  of ACE inhibitor in setting of hypovolemia.  I believe that renal  function baseline is good given that his creatinine normalized to 1.5.  Between February 27 and October 31, 2008, creatinine rose from 1.5-3.7  and has continued to rise to a current value of 3.  Differential  diagnosis for this progressive renal failure includes acute interstitial  nephritis which would most likely be related to nafcillin.  Due to his  nafcillin was stopped several days ago, creatinine has begun to  decrease.  However, the patient has not had peripheral or urine  eosinophilia which I used against acute interstitial nephritis.  Alternate diagnosis includes intravascular depletion.  Mucous membranes  are dry.  Urine sodium is less than 10.  He has significant peripheral  volume accumulation with edema, lower extremity edema, abdominal edema,  and scrotal edema.  This is most likely a third-spacing fluid with an  albumin of 1.4 and he has not been on NSAIDs and ACEs since his  hospitalization began.  I do not  suspect acute tubular necrosis as he  has had no contrast exposure and after original ischemic injury  creatinine normalized.  For  now, we will check CT of the abdomen and pelvis to evaluate the cause of  abdominal, scrotal, and lower extremity edema.  We will increase Lasix  to 80 mg IV q.8 h. to mobilize  excess fluid.  We will check a hepatitis  panel.  We will recheck CMET and we will continue to follow.  Thank you  for this consult.      Myrtis Ser, MD  Electronically Signed      Sherril Croon, M.D.  Electronically Signed    CW/MEDQ  D:  11/08/2008  T:  11/09/2008  Job:  [id number]

## 2011-01-16 NOTE — Discharge Summary (Signed)
Barry Haley NO.:  [id number]   MEDICAL RECORD NO.:  [id number]          PATIENT TYPE:  INP   LOCATION:  [id number]                         FACILITY:  [facility]   PHYSICIAN:  Sherryl Manges, M.D.  DATE OF BIRTH:  02-04-1946   DATE OF ADMISSION:  10/25/2008  DATE OF DISCHARGE:  11/15/2008                               DISCHARGE SUMMARY   ADDENDUM:   DATE OF DISCHARGE:  To be determined.   For discharge diagnoses, refer to interim summary dictated November 09, 2008, by Dr. Gean Birchwood.  Refer also to above summary for details  of consultations, clinical course, and procedures. For the period  however, from November 10, 2008 to November 15, 2008 i.e. the date of this  dictation, the following are pertinent:  The patient was continued on  intravenous Ancef, for MSSA bacteremia, meningitis and possible  endocarditis, under the supervision of the infectious disease team.  Dr.  Michel Bickers has indicated that the patient will benefit from a total  of 6 weeks Ancef therapy, and as of the date of this dictation on November 15, 2008, he was on day #20, and he is anticipated to complete the  therapy on December 07, 2008. He is on concomitant oral Vancomycin for C.  difficile colitis, although he is now asymptomatic from this viewpoint,  with normal bowel movements, no vomiting or nausea.  Per the GI team,  i.e. Dr. Erskine Emery who saw this patient on consultation, the patient  is recommended to continue oral Vancomycin therapy as long as he is on  Ancef therapy, to prevent a relapse.  His renal failure continues to  improve.  As he had hypoproteinemic edema, 24 hour urinary protein  quantification was carried out and showed 4175 mg of protein per 24  hours, i.e. nephrotic range proteinuria. Clearly the patient has  nephrotic syndrome, although precise etiology is unclear.  The renal  team was very helpful in the mana,gement of the patient's nephrosis and  have treated him with  intravenous diuretic therapy with satisfactory  clinical response and steady trending down of creatinine, so that on  November 15, 2008, creatinine was vastly improved at 2.11 with a BUN of 53.  Further improvement in these indices is anticipated.  Abdominal  subcutaneous edema, scrotal edema and bilateral lower extremity edema  have significantly subsided, and in the period from November 09, 2008 to  November 15, 2008, the patient has lost about 7 pounds in weight,  representing mobilization of edema fluid.  He did develop tinea cruris.  This has been addressed with topical Lotrimin AF. Blood pressure proved  somewhat difficult to control, necessitating utilization of a  combination of Hydralazine, Clonidine and beta-blocker. Further  titration of antihypertensive medications is anticipated.  The patient's  hemoglobin has remained stable and reasonable. As a matter of fact on  November 15, 2008 hemoglobin was 9.4 with hematocrit of 27.4.  This is  likely anemia of chronic disease, although given his age group,  endoscopic evaluation may be considered following recovery. The patient  is clearly deconditioned, secondary to his acute  medical problems,  however, he is making progress on a day to day basis.  He has been  evaluated by PT/OT during the course of this hospitalization and  rehabilitation is recommended.   DISPOSITION:  The patient is clearly nearing discharge. Inpatient  rehab/CIR consultation has been requested on November 15, 2008, and if he  is considered a suitable candidate, the patient will be discharged to  inpatient rehab within the next day or two. The renal team has indicated  that a renal biopsy may be required to elucidate the etiology of  nephrotic syndrome, but the precise timing is yet to be determined.   DISCHARGE MEDICATIONS:  1. Aspirin enteric-coated 325 mg p.o. daily.  2. Lopressor 25 mg p.o. t.i.d.  3. Lasix 80 mg p.o. b.i.d.  4. Florastor 250 mg p.o. b.i.d.  5.  Vancomycin 250 mg p.o. q.i.d, to be completed on December 07, 2008.  6. Protonix 40 mg p.o. daily.  7. Lotrimin 1% AF cream applied topically b.i.d. to affected areas of      skin.  8. Clonidine 0.3 mg p.o. t.i.d.  9. K-Dur 20 mEq p.o. daily.  10.Hydralazine 50 mg p.o. q.i.d.  11.Ensure vanilla liquid nutritional supplement 237 mL p.o. t.i.d.  12.Ancef 1 gram IV q.12 h. to be completed on December 07, 2008.   DIET:  Renal: 60-2-2.   ACTIVITY:  As tolerated. Otherwise, per PT/OT/rehab.   FOLLOW-UP INSTRUCTIONS:  This will be determined at the time of  discharge.      Sherryl Manges, M.D.  Electronically Signed     CO/MEDQ  D:  11/15/2008  T:  11/15/2008  Job:  [id number]

## 2011-01-16 NOTE — Consult Note (Signed)
NAMETOMMAS, SUITER              ACCOUNT NO.:  [id number]   MEDICAL RECORD NO.:  [id number]          PATIENT TYPE:  INP   LOCATION:  3015                         FACILITY:  [facility]   PHYSICIAN:  Pramod P. Leonie Man, MD    DATE OF BIRTH:  Mar 26, 1946   DATE OF CONSULTATION:  DATE OF DISCHARGE:                                 CONSULTATION   REFERRING PHYSICIAN:  Leonard Schwartz, MD   REASON FOR REFERRAL:  Headache and abnormal CT scan.   HISTORY OF PRESENT ILLNESS:  Mr. Cervantes is a 65 year old pleasant  Caucasian male who had a prolonged recent hospital admission last month.  He was admitted with delirium and altered mental status, and was  eventually found to have bacterial meningitis secondary to sepsis from  methicillin-resistant Staphylococcus aureus.  He had prolonged course of  antibiotics which he actually finished last week.  He started  complaining of new headache in the last 3-4 days.  The patient  complained of a new onset of headache for the last 3-4 days,  he  complains of headache over the vertex which is mild-to-moderate  accompanying by some light sensitivity.  The headache is relieved by  Tylenol, but comes back.  He was recently seen at an outside hospital in  [place] for new onset of right-sided Bell palsy as well.  He was treated  with a course of Zovirax as well as prednisone tapering dose.  The  patient's IV course of vancomycin was finished only yesterday.  He also  received Ancef 1 g every 8 hourly until yesterday as well.  The patient  was brought to the emergency room today for evaluation for this new  headache and he had a CT scan done which shows hyperdensity in the left  deep white matter and the parietal region which appears to be new  compared to previous CT scan from February 2010.  The patient denies any  new symptoms of diplopia, any neck stiffness, focal extremity weakness  or numbness.   PAST MEDICAL HISTORY:  Significant for hypertension, anemia,  recent  renal failure, MRSA sepsis.  He had presumed meningitis, but spinal tap  could not be done because his platelet count was low at that time.   MEDICATIONS:  Crestor, Lasix, amlodipine, metoprolol, vitamin D.   ALLERGIES:  None known.   SOCIAL HISTORY:  The patient lives in [place], [place].  He does  not smoke, drink, and denies doing drugs.   REVIEW OF SYSTEMS:  Negative for recent fever, cough, chest pain,  diarrhea, or illness.  Positive for recent meningitis, altered mental  status, weight loss, renal failure.   PHYSICAL EXAMINATION:  GENERAL:  Frail, middle-aged Caucasian male who  is currently not in distress.  VITAL SIGNS:  Afebrile with blood pressure 145/77, pulse rate 84 and  regular, respiratory rate 16 per minute.  Distal pulses well felt.  HEENT:  Head is nontraumatic.  ENT exam unremarkable.  NECK:  Supple.  There is no bruit.  CARDIAC:  Regular heart sounds.  LUNGS:  Clear to auscultation.  NEUROLOGIC:  The patient is pleasant, awake,  alert, cooperative.  He  follows commands well.  His speech and language appear normal.  He has  slight diminished attention and recall.  He has good verbal fluency and  can name 12 animals.  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  He is unable to close his eyelids or smile or blow his cheeks  on the right side.  Complains of some subjective paresthesias in the  right cheek.  His tongue is midline.  His palatal movements are normal.  Motor system exam reveals no upper extremity drift.  He has symmetric  strength, tone, reflexes, coordination, sensation in the lower  extremities.  His gait was not tested.   DATA REVIEWED:  Noncontrast CAT scan of the head done today reveals a  tiny punctate area of hyperdensity in the left deep parietal white  matter which is new compared with previous CT scan from October 25, 2008.   IMPRESSION:  A 65 year old gentleman with recent treatment for   methicillin-resistant Staphylococcus sepsis as well as presumed  meningitis with 4 weeks of Ancef and vancomycin.  He has now developed  right peripheral seventh nerve palsy as well as some new onset headache  raising question about partially treated meningitis versus residual  brain abscesses given abnormal CT scan.   PLAN:  I would recommend admission and do a spinal tap to look for  evidence of partially treated meningitis.  If the glucose is low and  cell count is still high, may need further course of antibiotics.  Also,  check MRI scan of the brain to evaluate for complications of meningitis  including strokes or abscesses and meningitis of the lower cranial  nerves.  I had a long discussion with the patient and her daughter  regarding his presenting symptom, discussed plan for evaluation and  treatment, answered questions.  Kindly call for questions.           ______________________________  Kathie Rhodes. Leonie Man, MD     PPS/MEDQ  D:  12/08/2008  T:  12/09/2008  Job:  [id number]

## 2011-01-16 NOTE — Discharge Summary (Signed)
NAMEMORTON, ELDERKIN NO.:  [id number]   MEDICAL RECORD NO.:  [id number]          PATIENT TYPE:  INP   LOCATION:  [id number]                         FACILITY:  [facility]   PHYSICIAN:  Brand Males, MD   DATE OF BIRTH:  03-21-1946   DATE OF ADMISSION:  10/25/2008  DATE OF DISCHARGE:  11/05/2008                               DISCHARGE SUMMARY   DISCHARGE DIAGNOSIS:  1. Methicillin-susceptible Staphylococcus aureus bacteremia and      meningitis.  2. Renal failure.  3. Deconditioning.  4. Poor dentition.  5. Delirium.   CONSULTANTS:  1. Michel Bickers, MD, with Infectious Disease.  2. Princess Bruins. Gaynell Face, MD, with Neurology.   PROCEDURES:  1. Endotracheal tube from October 25, 2008, to October 28, 2008.  2. Right internal jugular vein catheter placed on October 25, 2008.  3. Lumbar puncture on October 26, 2008.   CULTURE DATA:  Blood cultures from [facility] on October 25, 2017, demonstrating MSSA, gram-stain negative from CSF, HSV-PCR was  negative.  Urine strep pneumonia antigen was negative.   CURRENT LABORATORY DATA:  Date November 06, 2008, hemoglobin 8.4, hematocrit  23.7, white blood cell count 13.9, and platelet 256.  Date November 06, 2008, sodium 136, potassium 3.3, chloride 112, CO2 of 16, BUN 69,  creatinine 3.16, glucose 108, and INR 1.6.  C. diff on November 01, 2008,  positive, currently on oral vancomycin.   BRIEF HISTORY:  A 65 year old male patient with a history of  hypertension, was on ACE inhibitor prior to admit, transferred from  [facility] on October 25, 2008, with altered mental, status acute  renal failure, and elevated troponins.  He was admitted to the Pulmonary  Critical Care Service with MSSA meningitis and septic shock.   HOSPITAL COURSE:  1. MSSA bacteremia and meningitis with resultant severe sepsis and      septic shock.  Mr. Faulk was admitted to the Intensive Care.  He      was treated in the usual fashion with IV  crystalloid resuscitation,      brief hemodynamic support on vasoactive drips, and given altered      sensorium and a lumbar puncture was performed and neurology was      performed.  Although CSF did not demonstrate organisms, he did have      MSSA bacteremia, Infectious Disease was consulted, and it was felt      that his findings were consistent with MSSA meningitis as well.  As      previously mentioned, Infectious Disease was consulted, he was seen      by Dr. Michel Bickers, antibiotics were initiated empirically.  He      was started on October 25, 2008, with Rocephin, vancomycin,      Decadron, and acyclovir.  Eventually, this was transitioned to      nafcillin on November 04, 2008, given findings.  Antibiotic selection      has been continued to be managed by Infectious Disease.  The      patient was switched to Ancef on November 04, 2008.  Nafcillin was  discontinued on that day.  He is currently on Ancef, managed by      Infectious Disease Service with plan of 6-week total antibiotic      therapy.  From a hemodynamic standpoint, shock has resolved, he did      have multiple-organ dysfunction syndrome, this is resolving.  2. Acute renal failure secondary to MSSA bacteremia and meningitis.      This is now improving with slow improvement in his creatinine.  He      does have residual metabolic acidosis.  3. C. diff colitis, currently on p.o. vancomycin.  4. Delirium, slow to improve, continue supportive care.  5. Deconditioning.  He is being considered for inpatient      rehabilitation services.   DISCHARGE INSTRUCTIONS:  Dysphagia III thin liquids.  Activity, cleared  for physical therapy and occupational therapy services at discretion of  primary service.   DISPOSITION:  Currently pending questionable inpatient rehab eval.   DISCHARGE MEDICATIONS:  To be named upon time of final discharge.      Salvadore Dom, NP      Brand Males, MD  Electronically Signed     PB/MEDQ  D:  11/06/2008  T:  11/07/2008  Job:  [id number]

## 2011-01-16 NOTE — H&P (Signed)
Barry Haley, Barry Haley.:  [id number]   MEDICAL RECORD Haley.:  [id number]          PATIENT TYPE:  INP   LOCATION:  1845                         FACILITY:  [facility]   PHYSICIAN:  Barry Haley, MDDATE OF BIRTH:  April 30, 1946   DATE OF ADMISSION:  12/08/2008  DATE OF DISCHARGE:                              HISTORY & PHYSICAL   The patient will be followed inside the hospital by [facility].   REASON FOR ADMISSION:  Excruciating headache, recent history of  meningitis and concerning findings on a CT scan.  Barry Haley is a 65-  year-old male with past medical history of hypertension, clostridium  difficile colitis, recent MSSA bacteremia and meningitis, status post 6-  week treatment with Ancef IV and also recent history of Bell's palsy,  status post treatment with Valtrex and prednisone, who came to the  emergency department complaining of headache bilaterally, but mostly on  his right temporal and frontal lobe, poor vision and photophobia since  Sunday, 3 days prior to admission.  The patient decided to be evaluated  today while he was in the hospital removing his IV central line.  The  patient denies any neurologic deficit or weakness other than mild facial  droop and difficulty closing his right eye secondary to his Bell's  palsy.  The patient denies any fever, any nausea or vomiting, abdominal  pain, diarrhea or shortness of breath at this point.  The patient did  endorse increased fatigue and deconditioning secondary to his disease  and the fact that he has not been as active as before.   ALLERGIES:  Haley KNOWN DRUG ALLERGIES.   PAST MEDICAL HISTORY:  1. Hypertension.  2. MSSA bacteremia.  3. Meningitis.  4. Clostridium difficile colitis.  5. Bell's palsy.  6. History of migraines in the past.   MEDICATIONS:  1. Lasix 80 mg b.i.d.  2. Norvasc 10 mg daily.  3. Toprol XL 100 mg daily.  4. Crestor 10 mg daily.  5. K-Dur 20 mEq daily.   REVIEW OF  SYSTEMS:  Completely negative except for findings in the HPI.   SOCIAL HISTORY:  The patient is a smoker, smokes one-pack per day.  Occasional alcohol intake.  He denies illicit drugs.  He lives with his  wife.   PHYSICAL EXAMINATION:  VITAL SIGNS:  Blood pressure 168/83, heart rate  85, respiratory rate 20, temperature 98.4.  Oxygen saturation 100% on  room air.  GENERAL:  The patient was laying in bed without any acute distress.  LUNGS:  Clear to auscultation bilaterally.  Haley crackles, Haley wheezing.  HEART:  Regular rate and rhythm.  Haley murmurs, gallops or rubs.  ABDOMEN:  Soft, nontender and nondistended with positive bowel sounds.  EXTREMITIES:  Without edema, cyanosis or clubbing.  NEUROLOGIC:  Patient with a right facial droop, decreased sensation on  his right cheek, unable to close right eye and also flattening of his  right forehead crease.  The patient's extraocular movements were intact.  Pupils were equally round and reactive to light and accommodation.  There was Haley nystagmus.  There was negative  neck stiffness.  Negative  Babinski.  Negative Brudzinski and negative.  Kernig's signs as well.  The patient moved all extremities 5/5, and Haley focal deficit was  appreciated.   LABORATORY DATA:  White blood cells 13.9, hemoglobin 13.5, platelet 280.  Sodium 137, potassium 4.5, chloride 113, bicarb 20, BUN 44, creatinine  1.5, blood sugar 101.  PT 14.2, INR 1.1.  Liver function tests were  pending at the moment of admission.  The patient had a lumbar puncture  with cerebrospinal fluid with completely normal __________count, protein  and sugar.  Also, clear appearance and Haley microorganisms visualized on  Gram stain.  The patient had an erythrocyte sedimentation rate of 32.  He had a CT of the head which demonstrated white matter hypoattenuation  in the left cerebellum area with also a focal hyperdensity area inside  his white matter hypoattenuation.  There was concern for focal  punctate  hemorrhage.  The patient had an MRI pending on admission and neurology  was consulted while the patient was in the emergency department, who  decided to have the patient admitted for observation.   ASSESSMENT AND PLAN:  1. Headache and recent history of meningitis, patient with findings on      his CT scan and also a questionable history of missing a couple of      the doses of his antibiotics, the main concern was that his      meningitis was partially treated.  The patient is afebrile and      without any neurologic symptoms at this point with a negative      lumbar puncture.  We are going to admit the patient for      observation.  We are going to wait on the results of the MRI and we      are going to follow neurology recommendations.  Meanwhile, we will      provide good pain control for his headache and we are going to hold      on anticoagulation because of findings on the CT.  Because the      patient did not have any fever or any symptoms, we are not going to      restart antibiotics at this point.  Pending neurology decision in      the morning, we are going to readdress his symptoms and if the      patient continues stable, we are going to discharge home.  2. Leukocytosis, most likely at this point secondary to the treatment      that he received for his Bell's palsy, that included prednisone and      Valtrex for 10 days.  We are going to repeat a CBC in the morning.      The patient continues afebrile and without any other symptoms, so      Haley antibiotics will be started at this point.  3. History of Clostridium difficile colitis.  Because there is Haley      symptoms present, we will just watch him for now and we are not      going to start any treatment.  4. Hypertension, mildly elevated.  The patient has not taken his      medication this morning.  We will continue home regimen and we will      adjust his dose as needed.  5. Hyperlipidemia.  We are going to  continue Crestor and will check      FLP.  6. Tobacco  abuse.  We are going to ask the social worker for smoking      cessation counseling.  We will provide a nicotine patch.  For      prophylaxis, the patient will receive Protonix and sequential      compression devices.      Barry Bellini, MD  Electronically Signed      Barry Patience, MD  Electronically Signed    CEM/MEDQ  D:  12/08/2008  T:  12/08/2008  Job:  [id number]

## 2011-01-16 NOTE — Procedures (Signed)
CONSULTING PHYSICIAN:  Princess Bruins. Hickling, MD, who ordered this EEG.   EEG NUMBER:  [id number].   He is in room [facility].   EEG is reported on October 26, 2008, as a portable study for an ICU  bound patient, who is unresponsive.  According to his history, he is a  right-handed 65 year old gentleman who was admitted for acute mental  status changes, renal failure, fever, nausea, and vomiting.  In his  confusion, he became combative before becoming unresponsive.  He is  currently on a ventilator, the question of a encephalitis versus  encephalomeningitis has been raised, and the EEG was ordered to evaluate  possible seizure potential.   Activation procedures included a photic stimulation, but  hyperventilation was not possible for this intubated patient.   MEDICATIONS:  Zovirax, aspirin, NovoLog insulin, Protonix, Zosyn,  vancomycin, Diprivan and KCl.  There is no notes by the technician if  the patient was taken off Diprivan prior to this EEG recording.   DESCRIPTION:  A posterior dominant rhythm cannot be established on this  patient while reviewed in a bipolar referential montage.  The occipital  rhythm is disorganized and there appears to be burst suppression.  The  suppressed activity is at best 3 Hz, mostly 1 or 2.  The burst activity  is high amplitude activity around 12 Hz alpha range frequencies and  appears to involve both hemispheres, but the right hemisphere shows a  larger amplitude.  The EKG remains in normal sinus rhythm throughout  around 72-74 beats per minute.  This patient is not spontaneously  moving.  Birth activity lasts between 1 and 3 seconds.  Intermittent  electronic decrement lasts 3-5 seconds.  Sharp waves with an amplitude  maximum over the right parietal region were seen in a transverse montage  and a referential bipolar montage.  There is no phase reversal activity  noted, which would be an indication of ongoing seizure activities.  There  is also no periodic lateralized epileptiform discharge noted.  The  majority of burst activity appears symmetric with the central and  parietal focus.   CONCLUSION:  This is an abnormal EEG showing a non-continuous sharp wave  and spike wave discharges, and high frequency and high amplitude  alternating with electric detriment and theta and delta coma.  Since no  mentioning of Diprivan not being given during this EEG was made by the  technician, I cannot state if an medication side effect produces some of  these pictures.     Larey Seat, M.D.  Electronically Signed    [id number]  D:  10/26/2008 12:04:38  T:  10/27/2008 00:31:00  Job #:  [id number]

## 2011-01-16 NOTE — Discharge Summary (Signed)
NAMESIDY, CURTNER NO.:  [id number]   MEDICAL RECORD NO.:  [id number]          PATIENT TYPE:  INP   LOCATION:  5039                         FACILITY:  [facility]   PHYSICIAN:  Rise Patience, MDDATE OF BIRTH:  [birth year]   DATE OF ADMISSION:  10/25/2008  DATE OF DISCHARGE:                               DISCHARGE SUMMARY   This is interim discharge summary.   HOSPITAL COURSE:  A 65 year old male with history of hypertension who  had originally gone to [facility] for nausea, vomiting and  diarrhea, and was sent home with symptomatic treatment for possible  viral gastroenteritis.  He returned to the ER with altered mental  status, acute renal failure and positive troponins.  The patient was  transferred to [facility] where the patient was intubated for  airway protection.  Eventually the patient's blood cultures obtained at  [facility] grew positive for MSSA and the patient also had a  lumbar puncture done in this hospital which was compatible with  bacterial meningitis.  The patient was started on empiric therapy for  meningitis.  Subsequently the patient's condition improved and he was  transferred to the medical floor and to the medical service.  The  patient was consulted by infectious disease for antibiotic management.  The patient also was found to have acute renal failure with creatinine  around 4 which improved back to 1.5, but again went back to 3 now.  Nephrology was consulted and the patient was in [place].  Lasix  was started.  At this time the patient is receiving IV Lasix.  The  patient also was found to be anemic with C. difficile colitis positive  and positive guaiac stools.  GI also was consulted for the same.  Presently the patient is receiving 2 units of packed red blood cells for  anemia.  GI added p.o. Vancomycin for the C. difficile colitis.   PROCEDURE:  1. Lumbar puncture.  2. Chest x-ray on October 25, 2008  shows no acute cardiopulmonary      process.  3. October 25, 2008, renal sonogram shows mild cortical thinning, but      having no hydronephrosis or focal renal abnormality.  4. CT of abdomen and pelvis on November 08, 2008 shows no acute abnormal      process, subcentimeter hyperattenuating left renal lesion is most      likely a hemorrhagic cyst that is technically indeterminate on this      unenhanced CT, diffuse anasarca with small bilateral pleural      effusion question fluid overload, lack of visualization of appendix      without evidence of right lower quadrant inflammation.  In the      pelvis, mild pelvic adenopathy.  This does not specifically      correlate with any history of primary malignancy.  Consider follow-      up with pelvic CT at approximately 3 months.  Bladder saccules.      Diffuse anasarca.   FINAL DIAGNOSES:  1. Methicillin-sensitive Staphylococcus aureus bacteremia and      meningitis.  2.  Acute renal failure.  3. Metabolic acidosis secondary to acute renal failure.  4. Clostridium difficile colitis.  5. Anemia with positive guaiac stools probably from colitis.  6. History of hypertension.  7. Anasarca.   PLAN:  The patient is receiving IV Lasix and blood transfusion for his  acute renal failure and anemia.  The patient has been also on Ancef and  infectious disease consult with Dr. Megan Salon recommended four more weeks  of Ancef starting from November 08, 2008 for his bacteremia and MSSA  meningitis.  Further recommendations as condition evolves.  Addendum to  this discharge summary is to be dictated by the discharging MD at the  time of discharge.      Rise Patience, MD  Electronically Signed     ANK/MEDQ  D:  11/09/2008  T:  11/10/2008  Job:  [id number]

## 2011-05-23 LAB — PREPARE PLATELET PHERESIS

## 2011-09-18 DIAGNOSIS — N039 Chronic nephritic syndrome with unspecified morphologic changes: Secondary | ICD-10-CM | POA: Diagnosis not present

## 2011-09-18 DIAGNOSIS — N179 Acute kidney failure, unspecified: Secondary | ICD-10-CM | POA: Diagnosis not present

## 2011-09-19 DIAGNOSIS — E1149 Type 2 diabetes mellitus with other diabetic neurological complication: Secondary | ICD-10-CM | POA: Diagnosis not present

## 2011-09-19 DIAGNOSIS — M779 Enthesopathy, unspecified: Secondary | ICD-10-CM | POA: Diagnosis not present

## 2011-09-19 DIAGNOSIS — S9030XA Contusion of unspecified foot, initial encounter: Secondary | ICD-10-CM | POA: Diagnosis not present

## 2011-09-28 DIAGNOSIS — I10 Essential (primary) hypertension: Secondary | ICD-10-CM | POA: Diagnosis not present

## 2011-09-28 DIAGNOSIS — E7800 Pure hypercholesterolemia, unspecified: Secondary | ICD-10-CM | POA: Diagnosis not present

## 2011-09-28 DIAGNOSIS — F411 Generalized anxiety disorder: Secondary | ICD-10-CM | POA: Diagnosis not present

## 2011-09-28 DIAGNOSIS — N401 Enlarged prostate with lower urinary tract symptoms: Secondary | ICD-10-CM | POA: Diagnosis not present

## 2011-10-04 DIAGNOSIS — I10 Essential (primary) hypertension: Secondary | ICD-10-CM | POA: Diagnosis not present

## 2011-10-04 DIAGNOSIS — E7800 Pure hypercholesterolemia, unspecified: Secondary | ICD-10-CM | POA: Diagnosis not present

## 2011-10-19 DIAGNOSIS — Z125 Encounter for screening for malignant neoplasm of prostate: Secondary | ICD-10-CM | POA: Diagnosis not present

## 2011-11-27 DIAGNOSIS — R972 Elevated prostate specific antigen [PSA]: Secondary | ICD-10-CM | POA: Diagnosis not present

## 2011-11-27 DIAGNOSIS — R351 Nocturia: Secondary | ICD-10-CM | POA: Diagnosis not present

## 2011-11-27 DIAGNOSIS — N390 Urinary tract infection, site not specified: Secondary | ICD-10-CM | POA: Diagnosis not present

## 2011-11-27 DIAGNOSIS — N401 Enlarged prostate with lower urinary tract symptoms: Secondary | ICD-10-CM | POA: Diagnosis not present

## 2011-12-12 DIAGNOSIS — R972 Elevated prostate specific antigen [PSA]: Secondary | ICD-10-CM | POA: Diagnosis not present

## 2011-12-12 DIAGNOSIS — N401 Enlarged prostate with lower urinary tract symptoms: Secondary | ICD-10-CM | POA: Diagnosis not present

## 2012-01-01 DIAGNOSIS — Z79899 Other long term (current) drug therapy: Secondary | ICD-10-CM | POA: Diagnosis not present

## 2012-01-01 DIAGNOSIS — E119 Type 2 diabetes mellitus without complications: Secondary | ICD-10-CM | POA: Diagnosis not present

## 2012-01-01 DIAGNOSIS — D539 Nutritional anemia, unspecified: Secondary | ICD-10-CM | POA: Diagnosis not present

## 2012-01-01 DIAGNOSIS — I10 Essential (primary) hypertension: Secondary | ICD-10-CM | POA: Diagnosis not present

## 2012-01-02 DIAGNOSIS — F411 Generalized anxiety disorder: Secondary | ICD-10-CM | POA: Diagnosis not present

## 2012-01-02 DIAGNOSIS — I671 Cerebral aneurysm, nonruptured: Secondary | ICD-10-CM | POA: Diagnosis not present

## 2012-01-02 DIAGNOSIS — I6350 Cerebral infarction due to unspecified occlusion or stenosis of unspecified cerebral artery: Secondary | ICD-10-CM | POA: Diagnosis not present

## 2012-01-07 ENCOUNTER — Other Ambulatory Visit: Payer: Self-pay | Admitting: Neurology

## 2012-01-07 DIAGNOSIS — I671 Cerebral aneurysm, nonruptured: Secondary | ICD-10-CM

## 2012-01-11 DIAGNOSIS — N401 Enlarged prostate with lower urinary tract symptoms: Secondary | ICD-10-CM | POA: Diagnosis not present

## 2012-01-11 DIAGNOSIS — R972 Elevated prostate specific antigen [PSA]: Secondary | ICD-10-CM | POA: Diagnosis not present

## 2012-01-12 ENCOUNTER — Ambulatory Visit
Admission: RE | Admit: 2012-01-12 | Discharge: 2012-01-12 | Disposition: A | Discharge status: Home / Self Care | Payer: Self-pay | Source: Ambulatory Visit | Attending: Neurology | Admitting: Neurology

## 2012-01-12 DIAGNOSIS — I671 Cerebral aneurysm, nonruptured: Secondary | ICD-10-CM

## 2012-01-24 DIAGNOSIS — I671 Cerebral aneurysm, nonruptured: Secondary | ICD-10-CM | POA: Diagnosis not present

## 2012-01-26 ENCOUNTER — Ambulatory Visit
Admission: RE | Admit: 2012-01-26 | Discharge: 2012-01-26 | Disposition: A | Discharge status: Home / Self Care | Payer: Medicare Other | Source: Ambulatory Visit | Attending: Neurology | Admitting: Neurology

## 2012-01-26 DIAGNOSIS — I671 Cerebral aneurysm, nonruptured: Secondary | ICD-10-CM | POA: Diagnosis not present

## 2012-01-29 DIAGNOSIS — D509 Iron deficiency anemia, unspecified: Secondary | ICD-10-CM | POA: Diagnosis not present

## 2012-02-18 DIAGNOSIS — K648 Other hemorrhoids: Secondary | ICD-10-CM | POA: Diagnosis not present

## 2012-02-18 DIAGNOSIS — Z1211 Encounter for screening for malignant neoplasm of colon: Secondary | ICD-10-CM | POA: Diagnosis not present

## 2012-02-18 DIAGNOSIS — K5730 Diverticulosis of large intestine without perforation or abscess without bleeding: Secondary | ICD-10-CM | POA: Diagnosis not present

## 2012-02-18 DIAGNOSIS — D509 Iron deficiency anemia, unspecified: Secondary | ICD-10-CM | POA: Diagnosis not present

## 2012-02-18 DIAGNOSIS — D126 Benign neoplasm of colon, unspecified: Secondary | ICD-10-CM | POA: Diagnosis not present

## 2012-04-24 DIAGNOSIS — E7800 Pure hypercholesterolemia, unspecified: Secondary | ICD-10-CM | POA: Diagnosis not present

## 2012-04-24 DIAGNOSIS — E119 Type 2 diabetes mellitus without complications: Secondary | ICD-10-CM | POA: Diagnosis not present

## 2012-04-24 DIAGNOSIS — Z79899 Other long term (current) drug therapy: Secondary | ICD-10-CM | POA: Diagnosis not present

## 2012-07-07 DIAGNOSIS — F411 Generalized anxiety disorder: Secondary | ICD-10-CM | POA: Diagnosis not present

## 2012-07-07 DIAGNOSIS — I6350 Cerebral infarction due to unspecified occlusion or stenosis of unspecified cerebral artery: Secondary | ICD-10-CM | POA: Diagnosis not present

## 2012-07-24 DIAGNOSIS — H53439 Sector or arcuate defects, unspecified eye: Secondary | ICD-10-CM | POA: Diagnosis not present

## 2012-07-25 DIAGNOSIS — Z23 Encounter for immunization: Secondary | ICD-10-CM | POA: Diagnosis not present

## 2012-07-25 DIAGNOSIS — I10 Essential (primary) hypertension: Secondary | ICD-10-CM | POA: Diagnosis not present

## 2012-07-25 DIAGNOSIS — E119 Type 2 diabetes mellitus without complications: Secondary | ICD-10-CM | POA: Diagnosis not present

## 2012-08-07 DIAGNOSIS — I10 Essential (primary) hypertension: Secondary | ICD-10-CM | POA: Diagnosis not present

## 2012-08-13 DIAGNOSIS — I10 Essential (primary) hypertension: Secondary | ICD-10-CM | POA: Diagnosis not present

## 2012-09-08 DIAGNOSIS — E119 Type 2 diabetes mellitus without complications: Secondary | ICD-10-CM | POA: Diagnosis not present

## 2012-10-02 DIAGNOSIS — D649 Anemia, unspecified: Secondary | ICD-10-CM | POA: Diagnosis not present

## 2012-10-02 DIAGNOSIS — N2581 Secondary hyperparathyroidism of renal origin: Secondary | ICD-10-CM | POA: Diagnosis not present

## 2012-10-02 DIAGNOSIS — L259 Unspecified contact dermatitis, unspecified cause: Secondary | ICD-10-CM | POA: Diagnosis not present

## 2012-10-10 DIAGNOSIS — L280 Lichen simplex chronicus: Secondary | ICD-10-CM | POA: Diagnosis not present

## 2012-10-21 DIAGNOSIS — E7800 Pure hypercholesterolemia, unspecified: Secondary | ICD-10-CM | POA: Diagnosis not present

## 2012-10-23 DIAGNOSIS — L259 Unspecified contact dermatitis, unspecified cause: Secondary | ICD-10-CM | POA: Diagnosis not present

## 2012-12-22 DIAGNOSIS — S5000XA Contusion of unspecified elbow, initial encounter: Secondary | ICD-10-CM | POA: Diagnosis not present

## 2012-12-22 DIAGNOSIS — S60219A Contusion of unspecified wrist, initial encounter: Secondary | ICD-10-CM | POA: Diagnosis not present

## 2013-01-01 DIAGNOSIS — T884XXA Failed or difficult intubation, initial encounter: Secondary | ICD-10-CM

## 2013-01-01 HISTORY — DX: Failed or difficult intubation, initial encounter: T88.4XXA

## 2013-01-19 DIAGNOSIS — J029 Acute pharyngitis, unspecified: Secondary | ICD-10-CM | POA: Diagnosis not present

## 2013-01-19 DIAGNOSIS — E119 Type 2 diabetes mellitus without complications: Secondary | ICD-10-CM | POA: Diagnosis not present

## 2013-01-19 DIAGNOSIS — Z0000 Encounter for general adult medical examination without abnormal findings: Secondary | ICD-10-CM | POA: Diagnosis not present

## 2013-01-19 DIAGNOSIS — K5289 Other specified noninfective gastroenteritis and colitis: Secondary | ICD-10-CM | POA: Diagnosis not present

## 2013-01-20 DIAGNOSIS — E785 Hyperlipidemia, unspecified: Secondary | ICD-10-CM | POA: Diagnosis not present

## 2013-01-20 DIAGNOSIS — I2510 Atherosclerotic heart disease of native coronary artery without angina pectoris: Secondary | ICD-10-CM | POA: Diagnosis present

## 2013-01-20 DIAGNOSIS — R5383 Other fatigue: Secondary | ICD-10-CM | POA: Diagnosis not present

## 2013-01-20 DIAGNOSIS — R9431 Abnormal electrocardiogram [ECG] [EKG]: Secondary | ICD-10-CM | POA: Diagnosis not present

## 2013-01-20 DIAGNOSIS — R7881 Bacteremia: Secondary | ICD-10-CM | POA: Diagnosis not present

## 2013-01-20 DIAGNOSIS — N400 Enlarged prostate without lower urinary tract symptoms: Secondary | ICD-10-CM | POA: Diagnosis present

## 2013-01-20 DIAGNOSIS — R5381 Other malaise: Secondary | ICD-10-CM | POA: Diagnosis not present

## 2013-01-20 DIAGNOSIS — E860 Dehydration: Secondary | ICD-10-CM | POA: Diagnosis not present

## 2013-01-20 DIAGNOSIS — I369 Nonrheumatic tricuspid valve disorder, unspecified: Secondary | ICD-10-CM | POA: Diagnosis not present

## 2013-01-20 DIAGNOSIS — D509 Iron deficiency anemia, unspecified: Secondary | ICD-10-CM | POA: Diagnosis present

## 2013-01-20 DIAGNOSIS — N179 Acute kidney failure, unspecified: Secondary | ICD-10-CM | POA: Diagnosis not present

## 2013-01-20 DIAGNOSIS — I129 Hypertensive chronic kidney disease with stage 1 through stage 4 chronic kidney disease, or unspecified chronic kidney disease: Secondary | ICD-10-CM | POA: Diagnosis not present

## 2013-01-20 DIAGNOSIS — D696 Thrombocytopenia, unspecified: Secondary | ICD-10-CM | POA: Diagnosis not present

## 2013-01-20 DIAGNOSIS — I059 Rheumatic mitral valve disease, unspecified: Secondary | ICD-10-CM | POA: Diagnosis not present

## 2013-01-20 DIAGNOSIS — R578 Other shock: Secondary | ICD-10-CM | POA: Diagnosis not present

## 2013-01-20 DIAGNOSIS — I330 Acute and subacute infective endocarditis: Secondary | ICD-10-CM | POA: Diagnosis not present

## 2013-01-20 DIAGNOSIS — A419 Sepsis, unspecified organism: Secondary | ICD-10-CM | POA: Diagnosis not present

## 2013-01-20 DIAGNOSIS — R197 Diarrhea, unspecified: Secondary | ICD-10-CM | POA: Diagnosis not present

## 2013-01-20 DIAGNOSIS — F410 Panic disorder [episodic paroxysmal anxiety] without agoraphobia: Secondary | ICD-10-CM | POA: Diagnosis present

## 2013-01-20 DIAGNOSIS — I10 Essential (primary) hypertension: Secondary | ICD-10-CM | POA: Diagnosis present

## 2013-01-20 DIAGNOSIS — E871 Hypo-osmolality and hyponatremia: Secondary | ICD-10-CM | POA: Diagnosis not present

## 2013-01-20 DIAGNOSIS — Z8673 Personal history of transient ischemic attack (TIA), and cerebral infarction without residual deficits: Secondary | ICD-10-CM | POA: Diagnosis not present

## 2013-01-20 DIAGNOSIS — I200 Unstable angina: Secondary | ICD-10-CM | POA: Diagnosis not present

## 2013-01-20 DIAGNOSIS — R404 Transient alteration of awareness: Secondary | ICD-10-CM | POA: Diagnosis not present

## 2013-01-20 DIAGNOSIS — E876 Hypokalemia: Secondary | ICD-10-CM | POA: Diagnosis present

## 2013-01-20 DIAGNOSIS — E1159 Type 2 diabetes mellitus with other circulatory complications: Secondary | ICD-10-CM | POA: Diagnosis not present

## 2013-01-20 DIAGNOSIS — F329 Major depressive disorder, single episode, unspecified: Secondary | ICD-10-CM | POA: Diagnosis present

## 2013-01-20 DIAGNOSIS — N189 Chronic kidney disease, unspecified: Secondary | ICD-10-CM | POA: Diagnosis not present

## 2013-01-20 DIAGNOSIS — K219 Gastro-esophageal reflux disease without esophagitis: Secondary | ICD-10-CM | POA: Diagnosis present

## 2013-01-20 DIAGNOSIS — B9689 Other specified bacterial agents as the cause of diseases classified elsewhere: Secondary | ICD-10-CM | POA: Diagnosis not present

## 2013-01-20 DIAGNOSIS — I214 Non-ST elevation (NSTEMI) myocardial infarction: Secondary | ICD-10-CM | POA: Diagnosis not present

## 2013-01-20 DIAGNOSIS — A4189 Other specified sepsis: Secondary | ICD-10-CM | POA: Diagnosis not present

## 2013-01-20 DIAGNOSIS — E119 Type 2 diabetes mellitus without complications: Secondary | ICD-10-CM | POA: Diagnosis not present

## 2013-01-20 DIAGNOSIS — R930 Abnormal findings on diagnostic imaging of skull and head, not elsewhere classified: Secondary | ICD-10-CM | POA: Diagnosis not present

## 2013-01-20 DIAGNOSIS — R4182 Altered mental status, unspecified: Secondary | ICD-10-CM | POA: Diagnosis not present

## 2013-01-22 DIAGNOSIS — I6340 Cerebral infarction due to embolism of unspecified cerebral artery: Secondary | ICD-10-CM | POA: Insufficient documentation

## 2013-01-22 DIAGNOSIS — I38 Endocarditis, valve unspecified: Secondary | ICD-10-CM

## 2013-01-22 DIAGNOSIS — R29898 Other symptoms and signs involving the musculoskeletal system: Secondary | ICD-10-CM | POA: Insufficient documentation

## 2013-01-22 DIAGNOSIS — R7881 Bacteremia: Secondary | ICD-10-CM | POA: Insufficient documentation

## 2013-01-22 DIAGNOSIS — N179 Acute kidney failure, unspecified: Secondary | ICD-10-CM | POA: Insufficient documentation

## 2013-01-22 DIAGNOSIS — Z8679 Personal history of other diseases of the circulatory system: Secondary | ICD-10-CM

## 2013-01-22 DIAGNOSIS — I6350 Cerebral infarction due to unspecified occlusion or stenosis of unspecified cerebral artery: Secondary | ICD-10-CM | POA: Diagnosis not present

## 2013-01-22 HISTORY — DX: Personal history of other diseases of the circulatory system: Z86.79

## 2013-01-22 HISTORY — DX: Endocarditis, valve unspecified: I38

## 2013-01-22 HISTORY — DX: Cerebral infarction due to embolism of unspecified cerebral artery: I63.40

## 2013-01-22 HISTORY — DX: Other symptoms and signs involving the musculoskeletal system: R29.898

## 2013-01-23 DIAGNOSIS — Z4682 Encounter for fitting and adjustment of non-vascular catheter: Secondary | ICD-10-CM | POA: Diagnosis not present

## 2013-01-23 DIAGNOSIS — E872 Acidosis, unspecified: Secondary | ICD-10-CM | POA: Insufficient documentation

## 2013-01-23 DIAGNOSIS — I38 Endocarditis, valve unspecified: Secondary | ICD-10-CM | POA: Diagnosis not present

## 2013-01-23 DIAGNOSIS — I059 Rheumatic mitral valve disease, unspecified: Secondary | ICD-10-CM | POA: Diagnosis not present

## 2013-01-25 DIAGNOSIS — I4891 Unspecified atrial fibrillation: Secondary | ICD-10-CM | POA: Diagnosis not present

## 2013-01-25 DIAGNOSIS — R9431 Abnormal electrocardiogram [ECG] [EKG]: Secondary | ICD-10-CM | POA: Diagnosis not present

## 2013-01-26 DIAGNOSIS — I4891 Unspecified atrial fibrillation: Secondary | ICD-10-CM | POA: Diagnosis not present

## 2013-01-26 DIAGNOSIS — R9431 Abnormal electrocardiogram [ECG] [EKG]: Secondary | ICD-10-CM | POA: Diagnosis not present

## 2013-01-26 DIAGNOSIS — I38 Endocarditis, valve unspecified: Secondary | ICD-10-CM | POA: Diagnosis not present

## 2013-01-27 DIAGNOSIS — I059 Rheumatic mitral valve disease, unspecified: Secondary | ICD-10-CM | POA: Diagnosis not present

## 2013-01-27 DIAGNOSIS — Z954 Presence of other heart-valve replacement: Secondary | ICD-10-CM | POA: Diagnosis not present

## 2013-01-27 DIAGNOSIS — A4901 Methicillin susceptible Staphylococcus aureus infection, unspecified site: Secondary | ICD-10-CM | POA: Diagnosis not present

## 2013-01-27 DIAGNOSIS — A419 Sepsis, unspecified organism: Secondary | ICD-10-CM | POA: Diagnosis not present

## 2013-01-27 DIAGNOSIS — I38 Endocarditis, valve unspecified: Secondary | ICD-10-CM | POA: Diagnosis not present

## 2013-01-27 DIAGNOSIS — E872 Acidosis: Secondary | ICD-10-CM | POA: Diagnosis not present

## 2013-01-27 DIAGNOSIS — I369 Nonrheumatic tricuspid valve disorder, unspecified: Secondary | ICD-10-CM | POA: Diagnosis not present

## 2013-01-27 DIAGNOSIS — N179 Acute kidney failure, unspecified: Secondary | ICD-10-CM | POA: Diagnosis not present

## 2013-01-27 DIAGNOSIS — I330 Acute and subacute infective endocarditis: Secondary | ICD-10-CM | POA: Diagnosis not present

## 2013-01-27 DIAGNOSIS — Z01810 Encounter for preprocedural cardiovascular examination: Secondary | ICD-10-CM | POA: Diagnosis not present

## 2013-01-27 DIAGNOSIS — R7881 Bacteremia: Secondary | ICD-10-CM | POA: Diagnosis not present

## 2013-01-27 DIAGNOSIS — N189 Chronic kidney disease, unspecified: Secondary | ICD-10-CM | POA: Diagnosis not present

## 2013-01-27 HISTORY — PX: OTHER SURGICAL HISTORY: SHX169

## 2013-01-27 HISTORY — PX: MINIMALLY INVASIVE TRICUSPID VALVE REPAIR: SHX5975

## 2013-01-28 DIAGNOSIS — I4891 Unspecified atrial fibrillation: Secondary | ICD-10-CM | POA: Diagnosis not present

## 2013-01-28 DIAGNOSIS — R7881 Bacteremia: Secondary | ICD-10-CM | POA: Diagnosis not present

## 2013-01-28 DIAGNOSIS — A4902 Methicillin resistant Staphylococcus aureus infection, unspecified site: Secondary | ICD-10-CM | POA: Diagnosis not present

## 2013-01-28 DIAGNOSIS — Z452 Encounter for adjustment and management of vascular access device: Secondary | ICD-10-CM | POA: Diagnosis not present

## 2013-01-28 DIAGNOSIS — I38 Endocarditis, valve unspecified: Secondary | ICD-10-CM | POA: Diagnosis not present

## 2013-01-29 DIAGNOSIS — R7881 Bacteremia: Secondary | ICD-10-CM | POA: Diagnosis not present

## 2013-01-29 DIAGNOSIS — J810 Acute pulmonary edema: Secondary | ICD-10-CM | POA: Diagnosis not present

## 2013-01-29 DIAGNOSIS — J9589 Other postprocedural complications and disorders of respiratory system, not elsewhere classified: Secondary | ICD-10-CM | POA: Diagnosis not present

## 2013-01-29 DIAGNOSIS — I10 Essential (primary) hypertension: Secondary | ICD-10-CM | POA: Diagnosis not present

## 2013-01-29 DIAGNOSIS — A4902 Methicillin resistant Staphylococcus aureus infection, unspecified site: Secondary | ICD-10-CM | POA: Diagnosis not present

## 2013-01-29 DIAGNOSIS — Z954 Presence of other heart-valve replacement: Secondary | ICD-10-CM | POA: Diagnosis not present

## 2013-01-29 DIAGNOSIS — I38 Endocarditis, valve unspecified: Secondary | ICD-10-CM | POA: Diagnosis not present

## 2013-01-29 DIAGNOSIS — E119 Type 2 diabetes mellitus without complications: Secondary | ICD-10-CM | POA: Diagnosis not present

## 2013-01-29 DIAGNOSIS — G8918 Other acute postprocedural pain: Secondary | ICD-10-CM | POA: Diagnosis not present

## 2013-01-29 DIAGNOSIS — D62 Acute posthemorrhagic anemia: Secondary | ICD-10-CM | POA: Diagnosis not present

## 2013-01-30 DIAGNOSIS — R9431 Abnormal electrocardiogram [ECG] [EKG]: Secondary | ICD-10-CM | POA: Diagnosis not present

## 2013-01-31 DIAGNOSIS — R9431 Abnormal electrocardiogram [ECG] [EKG]: Secondary | ICD-10-CM | POA: Diagnosis not present

## 2013-01-31 DIAGNOSIS — I38 Endocarditis, valve unspecified: Secondary | ICD-10-CM | POA: Diagnosis not present

## 2013-01-31 DIAGNOSIS — J90 Pleural effusion, not elsewhere classified: Secondary | ICD-10-CM | POA: Diagnosis not present

## 2013-02-01 DIAGNOSIS — R9431 Abnormal electrocardiogram [ECG] [EKG]: Secondary | ICD-10-CM | POA: Diagnosis not present

## 2013-02-02 DIAGNOSIS — I38 Endocarditis, valve unspecified: Secondary | ICD-10-CM | POA: Diagnosis not present

## 2013-02-02 DIAGNOSIS — Z950 Presence of cardiac pacemaker: Secondary | ICD-10-CM | POA: Diagnosis not present

## 2013-02-02 DIAGNOSIS — R9431 Abnormal electrocardiogram [ECG] [EKG]: Secondary | ICD-10-CM | POA: Diagnosis not present

## 2013-02-03 DIAGNOSIS — R9431 Abnormal electrocardiogram [ECG] [EKG]: Secondary | ICD-10-CM | POA: Diagnosis not present

## 2013-02-04 DIAGNOSIS — R9431 Abnormal electrocardiogram [ECG] [EKG]: Secondary | ICD-10-CM | POA: Diagnosis not present

## 2013-02-06 DIAGNOSIS — Z7901 Long term (current) use of anticoagulants: Secondary | ICD-10-CM | POA: Diagnosis not present

## 2013-02-06 DIAGNOSIS — I059 Rheumatic mitral valve disease, unspecified: Secondary | ICD-10-CM | POA: Diagnosis not present

## 2013-02-06 DIAGNOSIS — Z5181 Encounter for therapeutic drug level monitoring: Secondary | ICD-10-CM | POA: Diagnosis not present

## 2013-02-06 DIAGNOSIS — I39 Endocarditis and heart valve disorders in diseases classified elsewhere: Secondary | ICD-10-CM | POA: Diagnosis not present

## 2013-02-06 DIAGNOSIS — R000 Tachycardia, unspecified: Secondary | ICD-10-CM | POA: Diagnosis not present

## 2013-02-06 DIAGNOSIS — D219 Benign neoplasm of connective and other soft tissue, unspecified: Secondary | ICD-10-CM | POA: Diagnosis not present

## 2013-02-06 DIAGNOSIS — I69928 Other speech and language deficits following unspecified cerebrovascular disease: Secondary | ICD-10-CM | POA: Diagnosis not present

## 2013-02-06 DIAGNOSIS — R079 Chest pain, unspecified: Secondary | ICD-10-CM | POA: Diagnosis not present

## 2013-02-06 DIAGNOSIS — I10 Essential (primary) hypertension: Secondary | ICD-10-CM | POA: Diagnosis not present

## 2013-02-06 DIAGNOSIS — Z79899 Other long term (current) drug therapy: Secondary | ICD-10-CM | POA: Diagnosis not present

## 2013-02-06 DIAGNOSIS — E119 Type 2 diabetes mellitus without complications: Secondary | ICD-10-CM | POA: Diagnosis not present

## 2013-02-06 DIAGNOSIS — A4902 Methicillin resistant Staphylococcus aureus infection, unspecified site: Secondary | ICD-10-CM | POA: Diagnosis not present

## 2013-02-06 DIAGNOSIS — I2510 Atherosclerotic heart disease of native coronary artery without angina pectoris: Secondary | ICD-10-CM | POA: Diagnosis not present

## 2013-02-06 DIAGNOSIS — N179 Acute kidney failure, unspecified: Secondary | ICD-10-CM | POA: Diagnosis not present

## 2013-02-06 DIAGNOSIS — M6281 Muscle weakness (generalized): Secondary | ICD-10-CM | POA: Diagnosis not present

## 2013-02-06 DIAGNOSIS — J90 Pleural effusion, not elsewhere classified: Secondary | ICD-10-CM | POA: Diagnosis not present

## 2013-02-06 DIAGNOSIS — R269 Unspecified abnormalities of gait and mobility: Secondary | ICD-10-CM | POA: Diagnosis not present

## 2013-02-06 DIAGNOSIS — R944 Abnormal results of kidney function studies: Secondary | ICD-10-CM | POA: Diagnosis not present

## 2013-02-06 DIAGNOSIS — E039 Hypothyroidism, unspecified: Secondary | ICD-10-CM | POA: Diagnosis not present

## 2013-02-06 DIAGNOSIS — K7689 Other specified diseases of liver: Secondary | ICD-10-CM | POA: Diagnosis not present

## 2013-02-06 DIAGNOSIS — Z5189 Encounter for other specified aftercare: Secondary | ICD-10-CM | POA: Diagnosis not present

## 2013-02-06 DIAGNOSIS — D649 Anemia, unspecified: Secondary | ICD-10-CM | POA: Diagnosis not present

## 2013-02-06 DIAGNOSIS — I38 Endocarditis, valve unspecified: Secondary | ICD-10-CM | POA: Diagnosis not present

## 2013-02-06 DIAGNOSIS — R5381 Other malaise: Secondary | ICD-10-CM | POA: Diagnosis not present

## 2013-02-06 DIAGNOSIS — Z954 Presence of other heart-valve replacement: Secondary | ICD-10-CM | POA: Diagnosis not present

## 2013-02-06 DIAGNOSIS — I339 Acute and subacute endocarditis, unspecified: Secondary | ICD-10-CM | POA: Diagnosis not present

## 2013-02-06 DIAGNOSIS — I330 Acute and subacute infective endocarditis: Secondary | ICD-10-CM | POA: Diagnosis not present

## 2013-02-06 DIAGNOSIS — R6889 Other general symptoms and signs: Secondary | ICD-10-CM | POA: Diagnosis not present

## 2013-02-08 DIAGNOSIS — I2510 Atherosclerotic heart disease of native coronary artery without angina pectoris: Secondary | ICD-10-CM | POA: Diagnosis not present

## 2013-02-08 DIAGNOSIS — R5381 Other malaise: Secondary | ICD-10-CM | POA: Diagnosis not present

## 2013-02-08 DIAGNOSIS — I39 Endocarditis and heart valve disorders in diseases classified elsewhere: Secondary | ICD-10-CM | POA: Diagnosis not present

## 2013-02-08 DIAGNOSIS — E119 Type 2 diabetes mellitus without complications: Secondary | ICD-10-CM | POA: Diagnosis not present

## 2013-02-11 DIAGNOSIS — I330 Acute and subacute infective endocarditis: Secondary | ICD-10-CM | POA: Diagnosis not present

## 2013-02-11 DIAGNOSIS — A4902 Methicillin resistant Staphylococcus aureus infection, unspecified site: Secondary | ICD-10-CM | POA: Diagnosis not present

## 2013-02-27 DIAGNOSIS — I330 Acute and subacute infective endocarditis: Secondary | ICD-10-CM | POA: Diagnosis not present

## 2013-02-27 DIAGNOSIS — A4902 Methicillin resistant Staphylococcus aureus infection, unspecified site: Secondary | ICD-10-CM | POA: Diagnosis not present

## 2013-03-03 DIAGNOSIS — Z79899 Other long term (current) drug therapy: Secondary | ICD-10-CM | POA: Diagnosis not present

## 2013-03-12 DIAGNOSIS — I059 Rheumatic mitral valve disease, unspecified: Secondary | ICD-10-CM | POA: Diagnosis not present

## 2013-03-13 DIAGNOSIS — E119 Type 2 diabetes mellitus without complications: Secondary | ICD-10-CM | POA: Diagnosis not present

## 2013-03-13 DIAGNOSIS — I39 Endocarditis and heart valve disorders in diseases classified elsewhere: Secondary | ICD-10-CM | POA: Diagnosis not present

## 2013-03-13 DIAGNOSIS — I2510 Atherosclerotic heart disease of native coronary artery without angina pectoris: Secondary | ICD-10-CM | POA: Diagnosis not present

## 2013-03-13 DIAGNOSIS — R5381 Other malaise: Secondary | ICD-10-CM | POA: Diagnosis not present

## 2013-03-30 DIAGNOSIS — I38 Endocarditis, valve unspecified: Secondary | ICD-10-CM | POA: Diagnosis not present

## 2013-05-14 DIAGNOSIS — K219 Gastro-esophageal reflux disease without esophagitis: Secondary | ICD-10-CM | POA: Diagnosis not present

## 2013-05-14 DIAGNOSIS — Z23 Encounter for immunization: Secondary | ICD-10-CM | POA: Diagnosis not present

## 2013-05-14 DIAGNOSIS — E7800 Pure hypercholesterolemia, unspecified: Secondary | ICD-10-CM | POA: Diagnosis not present

## 2013-05-14 DIAGNOSIS — N289 Disorder of kidney and ureter, unspecified: Secondary | ICD-10-CM | POA: Diagnosis not present

## 2013-05-14 DIAGNOSIS — D649 Anemia, unspecified: Secondary | ICD-10-CM | POA: Diagnosis not present

## 2013-06-23 ENCOUNTER — Other Ambulatory Visit: Payer: Self-pay

## 2013-06-23 MED ORDER — CITALOPRAM HYDROBROMIDE 40 MG PO TABS: 40.0000 mg | ORAL_TABLET | Freq: Every day | ORAL | Status: DC

## 2013-09-03 ENCOUNTER — Other Ambulatory Visit: Payer: Self-pay | Admitting: Neurology

## 2013-09-04 DIAGNOSIS — N401 Enlarged prostate with lower urinary tract symptoms: Secondary | ICD-10-CM | POA: Diagnosis not present

## 2013-09-04 DIAGNOSIS — N138 Other obstructive and reflux uropathy: Secondary | ICD-10-CM | POA: Diagnosis not present

## 2013-09-04 DIAGNOSIS — R972 Elevated prostate specific antigen [PSA]: Secondary | ICD-10-CM | POA: Diagnosis not present

## 2013-09-10 DIAGNOSIS — I079 Rheumatic tricuspid valve disease, unspecified: Secondary | ICD-10-CM | POA: Diagnosis not present

## 2013-09-11 DIAGNOSIS — L0291 Cutaneous abscess, unspecified: Secondary | ICD-10-CM | POA: Diagnosis not present

## 2013-09-11 DIAGNOSIS — L0390 Cellulitis, unspecified: Secondary | ICD-10-CM | POA: Diagnosis not present

## 2013-09-23 DIAGNOSIS — E119 Type 2 diabetes mellitus without complications: Secondary | ICD-10-CM | POA: Diagnosis not present

## 2013-11-10 DIAGNOSIS — N183 Chronic kidney disease, stage 3 unspecified: Secondary | ICD-10-CM | POA: Diagnosis not present

## 2013-11-10 DIAGNOSIS — D631 Anemia in chronic kidney disease: Secondary | ICD-10-CM | POA: Diagnosis not present

## 2013-11-10 DIAGNOSIS — N2581 Secondary hyperparathyroidism of renal origin: Secondary | ICD-10-CM | POA: Diagnosis not present

## 2013-12-03 DIAGNOSIS — N401 Enlarged prostate with lower urinary tract symptoms: Secondary | ICD-10-CM | POA: Diagnosis not present

## 2013-12-03 DIAGNOSIS — R972 Elevated prostate specific antigen [PSA]: Secondary | ICD-10-CM | POA: Diagnosis not present

## 2013-12-03 DIAGNOSIS — N138 Other obstructive and reflux uropathy: Secondary | ICD-10-CM | POA: Diagnosis not present

## 2014-01-21 DIAGNOSIS — R0609 Other forms of dyspnea: Secondary | ICD-10-CM | POA: Diagnosis not present

## 2014-01-21 DIAGNOSIS — L299 Pruritus, unspecified: Secondary | ICD-10-CM | POA: Diagnosis not present

## 2014-01-21 DIAGNOSIS — Z23 Encounter for immunization: Secondary | ICD-10-CM | POA: Diagnosis not present

## 2014-01-21 DIAGNOSIS — Z0000 Encounter for general adult medical examination without abnormal findings: Secondary | ICD-10-CM | POA: Diagnosis not present

## 2014-01-21 DIAGNOSIS — I499 Cardiac arrhythmia, unspecified: Secondary | ICD-10-CM | POA: Diagnosis not present

## 2014-01-21 DIAGNOSIS — E1129 Type 2 diabetes mellitus with other diabetic kidney complication: Secondary | ICD-10-CM | POA: Diagnosis not present

## 2014-01-27 DIAGNOSIS — R0602 Shortness of breath: Secondary | ICD-10-CM | POA: Diagnosis not present

## 2014-01-27 LAB — PULMONARY FUNCTION TEST

## 2014-01-28 DIAGNOSIS — S43499A Other sprain of unspecified shoulder joint, initial encounter: Secondary | ICD-10-CM | POA: Diagnosis not present

## 2014-01-28 DIAGNOSIS — S46819A Strain of other muscles, fascia and tendons at shoulder and upper arm level, unspecified arm, initial encounter: Secondary | ICD-10-CM | POA: Diagnosis not present

## 2014-02-18 DIAGNOSIS — H53439 Sector or arcuate defects, unspecified eye: Secondary | ICD-10-CM | POA: Diagnosis not present

## 2014-02-18 DIAGNOSIS — E11319 Type 2 diabetes mellitus with unspecified diabetic retinopathy without macular edema: Secondary | ICD-10-CM | POA: Diagnosis not present

## 2014-02-18 DIAGNOSIS — H2510 Age-related nuclear cataract, unspecified eye: Secondary | ICD-10-CM | POA: Diagnosis not present

## 2014-02-25 DIAGNOSIS — D539 Nutritional anemia, unspecified: Secondary | ICD-10-CM | POA: Diagnosis not present

## 2014-02-25 DIAGNOSIS — IMO0001 Reserved for inherently not codable concepts without codable children: Secondary | ICD-10-CM | POA: Diagnosis not present

## 2014-02-25 DIAGNOSIS — R609 Edema, unspecified: Secondary | ICD-10-CM | POA: Diagnosis not present

## 2014-02-25 DIAGNOSIS — R5381 Other malaise: Secondary | ICD-10-CM | POA: Diagnosis not present

## 2014-02-25 DIAGNOSIS — R942 Abnormal results of pulmonary function studies: Secondary | ICD-10-CM | POA: Diagnosis not present

## 2014-02-25 DIAGNOSIS — R0602 Shortness of breath: Secondary | ICD-10-CM | POA: Diagnosis not present

## 2014-03-04 DIAGNOSIS — I5043 Acute on chronic combined systolic (congestive) and diastolic (congestive) heart failure: Secondary | ICD-10-CM | POA: Diagnosis not present

## 2014-03-04 DIAGNOSIS — I509 Heart failure, unspecified: Secondary | ICD-10-CM | POA: Diagnosis not present

## 2014-03-19 ENCOUNTER — Telehealth: Payer: Self-pay | Admitting: Nurse Practitioner

## 2014-03-19 MED ORDER — CITALOPRAM HYDROBROMIDE 40 MG PO TABS: 40.0000 mg | ORAL_TABLET | Freq: Every day | ORAL | Status: DC

## 2014-03-19 MED ORDER — CLONAZEPAM 0.5 MG PO TABS: 0.5000 mg | ORAL_TABLET | Freq: Three times a day (TID) | ORAL | Status: DC | PRN

## 2014-03-19 NOTE — Telephone Encounter (Signed)
Patient has appt scheduled.  I called back.  Spoke with [employer].  She would like to know if a small Rx (5 tabs or less) of Klonopin or Ativan could be called into [facility] as well because the patient is having severe agitation and anxiety.  States she will be coming to the appt with Mr Vliet later this month, as she has several things to go over regarding medical history updates.   Dr Leonie Man is out of the office, forwarding request to [facility] for review.  Please advise.  Thank you.

## 2014-03-19 NOTE — Telephone Encounter (Signed)
Daughter calling requesting Rx refill citalopram (CELEXA) 40 MG tablet.  Please call and advise.  Work # [id number] [phone] [id number], cell [id number].  Thanks

## 2014-03-19 NOTE — Telephone Encounter (Signed)
This patient was seen previously did this office in November 2013. He was on clonazepam at that time. I will call in a small prescription. He has an appointment here on 03/24/2014.

## 2014-03-24 ENCOUNTER — Ambulatory Visit (INDEPENDENT_AMBULATORY_CARE_PROVIDER_SITE_OTHER): Payer: Medicare Other | Admitting: Nurse Practitioner

## 2014-03-24 ENCOUNTER — Encounter: Payer: Self-pay | Admitting: Nurse Practitioner

## 2014-03-24 VITALS — BP 155/61 | HR 47 | Temp 98.1°F | Ht 70.0 in | Wt 244.5 lb

## 2014-03-24 DIAGNOSIS — Z9889 Other specified postprocedural states: Secondary | ICD-10-CM | POA: Insufficient documentation

## 2014-03-24 DIAGNOSIS — G4733 Obstructive sleep apnea (adult) (pediatric): Secondary | ICD-10-CM | POA: Diagnosis not present

## 2014-03-24 DIAGNOSIS — I509 Heart failure, unspecified: Secondary | ICD-10-CM | POA: Diagnosis not present

## 2014-03-24 DIAGNOSIS — I5022 Chronic systolic (congestive) heart failure: Secondary | ICD-10-CM

## 2014-03-24 DIAGNOSIS — Z8673 Personal history of transient ischemic attack (TIA), and cerebral infarction without residual deficits: Secondary | ICD-10-CM | POA: Diagnosis not present

## 2014-03-24 HISTORY — DX: Other specified postprocedural states: Z98.890

## 2014-03-24 HISTORY — DX: Chronic systolic (congestive) heart failure: I50.22

## 2014-03-24 MED ORDER — CITALOPRAM HYDROBROMIDE 40 MG PO TABS: 40.0000 mg | ORAL_TABLET | Freq: Every day | ORAL | Status: DC

## 2014-03-24 MED ORDER — CLONAZEPAM 0.5 MG PO TABS: 0.5000 mg | ORAL_TABLET | Freq: Three times a day (TID) | ORAL | Status: DC | PRN

## 2014-03-24 NOTE — Patient Instructions (Addendum)
Continue aspirin and strict control of HT with SBP goal below 140 and Hyperlipidimia with LDL goal below 100mg .    Continue Citalopram 40 mg daily, refills sent to [facility].  Clonazepam 0.5 mg, 1 tablet every 8 hours ONLY AS NEEDED.  Dr. Edwena Felty assistant will call you to set up an appointment to review your cpap machine and settings, possible try a new mask.  Try to eat a healthy diet and get regular exercise.  Continue conservative follow up for cerebral aneurysm for now. Follow up in 6 months.   Stroke Prevention Some medical conditions and behaviors are associated with an increased chance of having a stroke. You may prevent a stroke by making healthy choices and managing medical conditions. HOW CAN I REDUCE MY RISK OF HAVING A STROKE?   Stay physically active. Get at least 30 minutes of activity on most or all days.  Do not smoke. It may also be helpful to avoid exposure to secondhand smoke.  Limit alcohol use. Moderate alcohol use is considered to be:  No more than 2 drinks per day for men.  No more than 1 drink per day for nonpregnant women.  Eat healthy foods. This involves  Eating 5 or more servings of fruits and vegetables a day.  Following a diet that addresses high blood pressure (hypertension), high cholesterol, diabetes, or obesity.  Manage your cholesterol levels.  A diet low in saturated fat, trans fat, and cholesterol and high in fiber may control cholesterol levels.  Take any prescribed medicines to control cholesterol as directed by your health care provider.  Manage your diabetes.  A controlled-carbohydrate, controlled-sugar diet is recommended to manage diabetes.  Take any prescribed medicines to control diabetes as directed by your health care provider.  Control your hypertension.  A low-salt (sodium), low-saturated fat, low-trans fat, and low-cholesterol diet is recommended to manage hypertension.  Take any prescribed medicines to control  hypertension as directed by your health care provider.  Maintain a healthy weight.  A reduced-calorie, low-sodium, low-saturated fat, low-trans fat, low-cholesterol diet is recommended to manage weight.  Stop drug abuse.  Avoid taking birth control pills.  Talk to your health care provider about the risks of taking birth control pills if you are over 31 years old, smoke, get migraines, or have ever had a blood clot.  Get evaluated for sleep disorders (sleep apnea).  Talk to your health care provider about getting a sleep evaluation if you snore a lot or have excessive sleepiness.  Take medicines as directed by your health care provider.  For some people, aspirin or blood thinners (anticoagulants) are helpful in reducing the risk of forming abnormal blood clots that can lead to stroke. If you have the irregular heart rhythm of atrial fibrillation, you should be on a blood thinner unless there is a good reason you cannot take them.  Understand all your medicine instructions.  Make sure that other other conditions (such as anemia or atherosclerosis) are addressed. SEEK IMMEDIATE MEDICAL CARE IF:   You have sudden weakness or numbness of the face, arm, or leg, especially on one side of the body.  Your face or eyelid droops to one side.  You have sudden confusion.  You have trouble speaking (aphasia) or understanding.  You have sudden trouble seeing in one or both eyes.  You have sudden trouble walking.  You have dizziness.  You have a loss of balance or coordination.  You have a sudden, severe headache with no known cause.  You have new chest pain or an irregular heartbeat. Any of these symptoms may represent a serious problem that is an emergency. Do not wait to see if the symptoms will go away. Get medical help at once. Call your local emergency services  (911 in [place] Do not drive yourself to the hospital. Document Released: 09/27/2004 Document Revised: 06/10/2013  Document Reviewed: 02/20/2013 [facility] Patient Information 2015 [place], [place]. This information is not intended to replace advice given to you by your health care provider. Make sure you discuss any questions you have with your health care provider.

## 2014-03-24 NOTE — Progress Notes (Signed)
PATIENT: Barry Haley DOB: 1945/11/18  REASON FOR VISIT: routine follow up for stroke HISTORY FROM: patient  HISTORY OF PRESENT ILLNESS: 68 y.o. 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 Risk factors of HT and Hyperlipidimia. 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  07/2012 (JM): Returns for follow up since last visit on 01/02/12.  No new neurovascular symptoms.  Mild memory difficulties remain unchanged will occasionally have difficulty with word finding.  He has not had any nightmares since his last visit.  Blood pressure at home 120/60; today in office 140/76.  No recent HgbA1c; next appointment is due this month planning to schedule with his PCP.  Has had 6 pound weight loss.  MRA of head on 01/26/12 showed no significant stenosis of the medium-to large size intracranial vessels.  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 He has rarely used his CPAP. He is tolerating ASA 81mg  well without significant bruising or bleeding.    Update 03/24/14 (LL): Patient comes in for stroke revisit, last visit was 07/2012 with NP.  Has since had Tricuspid and Mitral valve repair at [facility] in May 2014 with Dr. Lunette Stands.  Has since been dx with CHF, sees Dr. Maylon Peppers at [facility], has DOE and leg swelling. On daily Lasix. He states his blood pressure is well controlled, although it is 155/61 today in the office.  He has not been using cpap due to mask fitting poorly, family would like him to see Dr. Brett Fairy again for possible adjustment to his machine settings and smaller type mask. Wife and daughter report that he has more frequent agitation, "short fuse." Has been on Citalopram 40 mg daily and Klonopin prn since stroke in 2011. Denies  any recurrent stroke symptoms. He is tolerating aspirin well with no signs of significant bleeding or bruising.   Review of Systems  Out of a complete 14 system review, the patient complains of only the following symptoms, and all other reviewed systems are negative.  Constitutional: Weight gain   Fatigue   Respiratory: Snoring   Hematology/Lymphatic: Anemia   Easy bruising   Easy bleeding   Endocrine: Increased thirst  Feeling Cold   Psychiatric: Anxiety, agitation    ALLERGIES: Allergies  Allergen Reactions  . Zolpidem Anxiety    Bad dreams    HOME MEDICATIONS: Outpatient Prescriptions Prior to Visit  Medication Sig Dispense Refill  . amLODipine (NORVASC) 5 MG tablet Take 2.5 mg by mouth daily.       Marland Kitchen esomeprazole (NEXIUM) 40 MG capsule Take 40 mg by mouth daily.        . Tamsulosin HCl (FLOMAX) 0.4 MG CAPS Take 0.4 mg by mouth at bedtime.        . vitamin B-12 (CYANOCOBALAMIN) 1000 MCG tablet Take 1,000 mcg by mouth daily.        . citalopram (CELEXA) 40 MG tablet Take 1 tablet (40 mg total) by mouth daily.  30 tablet  0  . clonazePAM (KLONOPIN) 0.5 MG tablet Take 1 tablet (0.5 mg total) by mouth every 8 (eight) hours as needed.  10 tablet  0  . traMADol (ULTRAM) 50 MG tablet Take 50 mg by mouth every 6 (six) hours as needed.        Marland Kitchen amoxicillin (AMOXIL) 500 MG  capsule Take 2,000 mg by mouth as directed. 4 tabs 30 minutes prior to dental procedure       . aspirin 325 MG tablet Take 325 mg by mouth daily.        . divalproex (DEPAKOTE) 500 MG EC tablet Take 500 mg by mouth 2 (two) times daily.        . hydrochlorothiazide 25 MG tablet Take 25 mg by mouth daily.        Marland Kitchen lisinopril (PRINIVIL,ZESTRIL) 40 MG tablet Take 40 mg by mouth daily.        . metoprolol (TOPROL-XL) 100 MG 24 hr tablet Take 100 mg by mouth daily.        Marland Kitchen omega-3 acid ethyl esters (LOVAZA) 1 G capsule Take 2 g by mouth 2 (two) times daily.        . Prenatal Vit-Fe Fumarate-FA (PRENAVITE MULTIPLE VITAMIN) 28-0.8  MG tablet Take 1 tablet by mouth daily.        . rosuvastatin (CRESTOR) 10 MG tablet Take 10 mg by mouth daily.         No facility-administered medications prior to visit.    PHYSICAL EXAM Filed Vitals:   03/24/14 1502  BP: 155/61  Pulse: 47  Temp: 98.1 F (36.7 C)  TempSrc: Oral  Height: 5\' 10"  (1.778 m)  Weight: 244 lb 8 oz (110.904 kg)   Body mass index is 35.08 kg/(m^2).  Physical Exam  General: Pleasant middle aged Caucasian male, in no distress.  Afebrile.   Head: nontraumatic Ears, Nose and Throat: Hearing is normal.  Neck: supple without bruit Respiratory: clear to auscultation Cardiovascular: no murmur or gallop Musculoskeletal: no deformity Skin: no rash or petechiae  Neurologic Exam  Mental Status: Awake, alert and oriented to time, place and person.  Speech and language appear normal.  No MMSE this visit.  Cranial Nerves: Eye movements are full range without nystagmus.  Fundi not examined today.  Visual fields are full to confrontational testing but limited cooperation.  Face is symmetric without weakness.  Tongue is midline. Hearing is normal. Motor: reveals no upper or lower extremity drift.  Symmetric and equal strength in all four extremities.  No focal weakness.mild upper extremity fine action tremors left more than right. Sensory: Touch and pinprick sensations are normal.   Coordination: normal Gait and Station: steady gait, tandem walking unsteady. Romberg negative. Reflexes: Deep tendon reflexes are 1+ symmetric.    ASSESSMENT: 68 y.o. 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 Risk factors of HTN, Hyperlipidimia, Diabetes, obstructive sleep apnea and Obesity. 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  Doing well, no new  neurovascular symptoms.   PLAN: Continue aspirin and strict control of HT with SBP goal below 130 and Hyperlipidimia with LDL goal below 100mg % .  Citalopram 40mg  take 1 tab by mouth daily refill #3, 90 tabs. I have counseled him again to wear his CPAP mask every night regularly to help with fatigue and stroke risk. I am referring to Dr. Brett Fairy for consultation for new mask and check machine settings.  Also discussed healthy diet and regular exercise.  Continue conservative follow up for cerebral aneurysm for now. Follow up in 6 months with Dr. Leonie Man, sooner as needed.    Orders Placed This Encounter  Procedures  .  Ambulatory referral to Sleep Studies   Meds ordered this encounter  Medications  . citalopram (CELEXA) 40 MG tablet    Sig: Take 1 tablet (40 mg total) by mouth daily.    Dispense:  90 tablet    Refill:  3    Order Specific Question:  Supervising Provider    Answer:  Leonie Man, PRAMOD [[id number]]  . clonazePAM (KLONOPIN) 0.5 MG tablet    Sig: Take 1 tablet (0.5 mg total) by mouth every 8 (eight) hours as needed.    Dispense:  30 tablet    Refill:  5    FAX to [employer] [phone]    Order Specific Question:  Supervising Provider    Answer:  Antony Contras [[id number]]   Return in about 6 months (around 09/24/2014) for stroke revisit.  Rudi Rummage Branden Vine, MSN, FNP-BC, A/GNP-C 03/24/2014, 9:21 PM [facility] [address], [place], [place] [address] [phone]  Note: This document was prepared with digital dictation and possible smart phrase technology. Any transcriptional errors that result from this process are unintentional.

## 2014-03-25 ENCOUNTER — Telehealth: Payer: Self-pay | Admitting: Neurology

## 2014-03-25 NOTE — Telephone Encounter (Signed)
ERROR

## 2014-03-26 NOTE — Progress Notes (Signed)
I agree with the above plan 

## 2014-04-02 ENCOUNTER — Encounter: Payer: Self-pay | Admitting: Neurology

## 2014-04-02 ENCOUNTER — Ambulatory Visit (INDEPENDENT_AMBULATORY_CARE_PROVIDER_SITE_OTHER): Payer: Medicare Other | Admitting: Neurology

## 2014-04-02 VITALS — BP 121/58 | HR 49 | Resp 17 | Ht 69.75 in | Wt 233.0 lb

## 2014-04-02 DIAGNOSIS — I639 Cerebral infarction, unspecified: Secondary | ICD-10-CM

## 2014-04-02 DIAGNOSIS — I4949 Other premature depolarization: Secondary | ICD-10-CM

## 2014-04-02 DIAGNOSIS — I493 Ventricular premature depolarization: Secondary | ICD-10-CM

## 2014-04-02 DIAGNOSIS — I6340 Cerebral infarction due to embolism of unspecified cerebral artery: Secondary | ICD-10-CM

## 2014-04-02 DIAGNOSIS — I6350 Cerebral infarction due to unspecified occlusion or stenosis of unspecified cerebral artery: Secondary | ICD-10-CM

## 2014-04-02 DIAGNOSIS — G4733 Obstructive sleep apnea (adult) (pediatric): Secondary | ICD-10-CM

## 2014-04-02 HISTORY — DX: Ventricular premature depolarization: I49.3

## 2014-04-02 HISTORY — DX: Obstructive sleep apnea (adult) (pediatric): G47.33

## 2014-04-02 NOTE — Progress Notes (Signed)
PATIENT: Barry Haley DOB: Aug 18, 1946  REASON FOR VISIT: routine follow up for stroke HISTORY FROM: patient  HISTORY OF PRESENT ILLNESS: This patient is  A [place] war veteran  Who has some flash back episodes, possible PTSD. Acting out dreams at night.  He was presenting with an episode of confusion,  his daughter found  him undressed wandering about. Called  EMS . He was brought to hospital [facility], tox screen negative,, but he went into  Renal failure. Only  After transport to [facility] positive bacterial meningitis. February 22 in 2010.   In 2012 he was diagnosed with sleep apnea, and  Lost in follow up. [facility].  He remains a stroke patient of Dr Lenell Antu.  Patient has not used CPAP for a long time. Non compliant.  He is here to re instate CPAP use. Nasal mask user.   He goes to bed, falls asleep in minutes, has nocturia, wakes up frequently and is very sleepy all day, download shows 8 cm water, reduced AHI of 5.3.  He snores thunderously , has witnessed apneas. Noncompliant  Overnight  Sleep time 3-4 hours only. Naps 3-4 times.   Epworth 21 , FSS 49, GDS 2 .    Last visit note. 68 y.o. 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  The patient  had undergone heart valve repair, Risk factors  For embolism, has a history of meningitis. HTN and Hyperlipidimia. 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  07/2012 (JM): Returns for follow up since last visit on 01/02/12.  No new neurovascular symptoms.  Mild memory difficulties remain unchanged will occasionally have difficulty with word finding.  He has not had any nightmares since his last visit.  Blood pressure at home 120/60; today in office 140/76.  No recent HgbA1c; next  appointment is due this month planning to schedule with his PCP.  Has had 6 pound weight loss.  MRA of head on 01/26/12 showed no significant stenosis of the medium-to large size intracranial vessels.  Stable 58mm medial right cavernous carotid aneurysm and 51mm anterior communicating artery aneurysms. He has rarely used his CPAP. He is tolerating ASA 81mg  well without significant bruising or bleeding.    Update 03/24/14 (LL): Patient comes in for stroke revisit, last visit was 07/2012 with NP.  Has since had Tricuspid and Mitral valve repair at [facility] in May 2014 with Dr. Lunette Stands.  Has since been dx with CHF, sees Dr. Maylon Peppers at [facility], has DOE and leg swelling. On daily Lasix. He states his blood pressure is well controlled, although it is 155/61 today in the office.  He has not been using cpap due to mask fitting poorly, family would like him to see Dr. Brett Fairy again for possible adjustment to his machine settings and smaller type mask. Wife and daughter report that he has more frequent agitation, "short fuse." Has been on Citalopram 40 mg daily and Klonopin prn since stroke in 2011. Denies any recurrent stroke symptoms. He is tolerating aspirin well with no signs of significant bleeding or bruising.   Review of Systems  Out of a complete 14 system review, the patient complains of only the following symptoms, and all other reviewed systems are negative.  Constitutional: Weight gain   Fatigue  Respiratory: Snoring   Hematology/Lymphatic: Anemia   Easy bruising   Easy bleeding   Endocrine: Increased thirst  Feeling Cold   Psychiatric: Anxiety, agitation    ALLERGIES: Allergies  Allergen Reactions  . Zolpidem Anxiety    Bad dreams    HOME MEDICATIONS: Outpatient Prescriptions Prior to Visit  Medication Sig Dispense Refill  . amLODipine (NORVASC) 5 MG tablet Take 2.5 mg by mouth daily.       Marland Kitchen aspirin 81 MG tablet Take 81 mg by mouth daily.      Marland Kitchen atorvastatin (LIPITOR) 10 MG tablet Take 10  mg by mouth at bedtime.      . citalopram (CELEXA) 40 MG tablet Take 1 tablet (40 mg total) by mouth daily.  90 tablet  3  . clonazePAM (KLONOPIN) 0.5 MG tablet Take 1 tablet (0.5 mg total) by mouth every 8 (eight) hours as needed.  30 tablet  5  . diltiazem (CARDIZEM CD) 240 MG 24 hr capsule Take 240 mg by mouth daily.      Marland Kitchen esomeprazole (NEXIUM) 40 MG capsule Take 40 mg by mouth daily.        Marland Kitchen glimepiride (AMARYL) 2 MG tablet Take 2 mg by mouth daily with breakfast.      . metoprolol succinate (TOPROL-XL) 50 MG 24 hr tablet Take 100 mg by mouth daily. Take with or immediately following a meal.      . sitaGLIPtin (JANUVIA) 50 MG tablet Take 50 mg by mouth daily.      . Tamsulosin HCl (FLOMAX) 0.4 MG CAPS Take 0.4 mg by mouth at bedtime.        . vitamin B-12 (CYANOCOBALAMIN) 1000 MCG tablet Take 1,000 mcg by mouth daily.         No facility-administered medications prior to visit.    PHYSICAL EXAM Filed Vitals:   04/02/14 1010  BP: 121/58  Pulse: 49  Resp: 17  Height: 5' 9.75" (1.772 m)  Weight: 233 lb (105.688 kg)   Body mass index is 33.66 kg/(m^2).  Physical Exam  General: Pleasant middle aged Caucasian male, in no distress.  Afebrile.  Cooperative .  Head: nontraumatic Ears, Nose and Throat:  mallompati 3 , retrognathia, no TMJ, no carotid bruit . Neck 17 inches.  Neck: supple without bruit Respiratory: clear to auscultation, RR 17 min. No wheezing today , Cardiovascular: murmur Musculoskeletal: no deformity Skin:  Edema in both legs . Petechiae over the lower arms.  (rash or petechiae ?)  Neurologic Exam  Mental Status: Awake, alert and oriented to time, place and person.  Speech and language appear normal.   Cranial Nerves: Eye movements are full range without nystagmus.   Face is symmetric without weakness.  Tongue is midline. Hearing is normal. Motor: reveals no upper or lower extremity drift.  Symmetric and equal strength in all four extremities.  No focal  weakness.mild upper extremity fine action tremors left more than right. Sensory: Touch and pinprick sensations are normal.   Coordination: normal Gait and Station: steady gait, tandem walking unsteady. Romberg negative. Reflexes: Deep tendon reflexes are 1+ symmetric.    ASSESSMENT:  OSA with CPAP, shortness and wheezing with breathing,  Status post meningitis. CHF ,  68 y.o. 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 Risk factors of HTN, Hyperlipidimia, Diabetes, obstructive sleep apnea and Obesity.  Asymptomatic  terminal RT ICA and anterior communicating artery aneurysms which are stable on CT angiogram 11/13/10.  Doing well, no new neurovascular symptoms.   PLAN:  SPLIT study report ordered.  Split repeat ,  Meds ordered this encounter  Medications  . citalopram (CELEXA) 40 MG tablet    Sig: Take 1 tablet (40 mg total) by mouth daily.    Dispense:  90 tablet    Refill:  3    Order Specific Question:  Supervising Provider    Answer:  Leonie Man, PRAMOD [[id number]]  . clonazePAM (KLONOPIN) 0.5 MG tablet    Sig: Take 1 tablet (0.5 mg total) by mouth every 8 (eight) hours as needed.    Dispense:  30 tablet    Refill:  5    FAX to [employer] [phone]    Order Specific Question:  Supervising Provider    Answer:  Antony Contras [[id number]]     Larey Seat, MD  [facility] [address], [place], [place] [address] [phone]  Note: This document was prepared with digital dictation and possible smart phrase technology.   Any transcriptional errors that result from this process are unintentional.

## 2014-04-02 NOTE — Patient Instructions (Signed)
Post-traumatic Stress °You have post-traumatic stress disorder (PTSD). This condition causes many different symptoms including: emotional outbursts, anxiety, sleeping problems, social withdrawal, and drug abuse. PTSD often follows a particularly traumatic event such as war, or natural disasters like hurricanes, earthquakes, or floods. It can also be seen after personal traumas such as accidents, rape, or the death of someone you love. Symptoms may be delayed for days or even years. Emotional numbing and the inability to feel your emotions, may be the earliest sign. Periods of agitation, aggression, and inability to perform ordinary tasks are common with PTSD. °Nightmares and daytime memories of the trauma often bring on uncontrolled symptoms. Sufferers typically startle easily and avoid reminders of the trauma. Panic attacks, feelings of extreme guilt, and blackouts are often reported. Treatment is very helpful, especially group therapy. Healing happens when emotional traumas are shared with others who have a sympathetic ear. The [facility] have helped over 185,000 veterans with this problem. Medication is also very effective. The symptoms can become chronic and lifelong, so it is important to get help. Call your caregiver or a counselor who deals with this type of problem for further assistance. °Document Released: 09/27/2004 Document Revised: 11/12/2011 Document Reviewed: 08/20/2005 °Exit[employer]® Patient Information ©2015 [employer]. This information is not intended to replace advice given to you by your health care provider. Make sure you discuss any questions you have with your health care provider. ° °

## 2014-04-05 ENCOUNTER — Encounter: Payer: Self-pay | Admitting: Critical Care Medicine

## 2014-04-05 DIAGNOSIS — R972 Elevated prostate specific antigen [PSA]: Secondary | ICD-10-CM | POA: Diagnosis not present

## 2014-04-05 DIAGNOSIS — R351 Nocturia: Secondary | ICD-10-CM | POA: Diagnosis not present

## 2014-04-05 DIAGNOSIS — N401 Enlarged prostate with lower urinary tract symptoms: Secondary | ICD-10-CM | POA: Diagnosis not present

## 2014-04-06 ENCOUNTER — Encounter: Payer: Self-pay | Admitting: Critical Care Medicine

## 2014-04-06 ENCOUNTER — Ambulatory Visit (INDEPENDENT_AMBULATORY_CARE_PROVIDER_SITE_OTHER): Payer: Medicare Other | Admitting: Critical Care Medicine

## 2014-04-06 VITALS — BP 110/60 | HR 52 | Temp 98.1°F | Ht 69.5 in | Wt 230.4 lb

## 2014-04-06 DIAGNOSIS — E1129 Type 2 diabetes mellitus with other diabetic kidney complication: Secondary | ICD-10-CM | POA: Insufficient documentation

## 2014-04-06 DIAGNOSIS — E1122 Type 2 diabetes mellitus with diabetic chronic kidney disease: Secondary | ICD-10-CM

## 2014-04-06 DIAGNOSIS — N183 Chronic kidney disease, stage 3 unspecified: Secondary | ICD-10-CM

## 2014-04-06 DIAGNOSIS — I5032 Chronic diastolic (congestive) heart failure: Secondary | ICD-10-CM

## 2014-04-06 DIAGNOSIS — R0989 Other specified symptoms and signs involving the circulatory and respiratory systems: Secondary | ICD-10-CM

## 2014-04-06 DIAGNOSIS — R0600 Dyspnea, unspecified: Secondary | ICD-10-CM | POA: Insufficient documentation

## 2014-04-06 DIAGNOSIS — R0609 Other forms of dyspnea: Secondary | ICD-10-CM

## 2014-04-06 DIAGNOSIS — G4733 Obstructive sleep apnea (adult) (pediatric): Secondary | ICD-10-CM

## 2014-04-06 DIAGNOSIS — R0602 Shortness of breath: Secondary | ICD-10-CM | POA: Diagnosis not present

## 2014-04-06 DIAGNOSIS — I509 Heart failure, unspecified: Secondary | ICD-10-CM | POA: Diagnosis not present

## 2014-04-06 HISTORY — DX: Type 2 diabetes mellitus with other diabetic kidney complication: E11.29

## 2014-04-06 HISTORY — DX: Chronic kidney disease, stage 3 unspecified: N18.30

## 2014-04-06 HISTORY — DX: Dyspnea, unspecified: R06.00

## 2014-04-06 HISTORY — DX: Type 2 diabetes mellitus with diabetic chronic kidney disease: E11.22

## 2014-04-06 NOTE — Assessment & Plan Note (Signed)
Dyspnea with associated restrictive lung disease on the basis of obesity and previous cardiac surgery with associated diastolic heart failure. No evidence of primary lung disease with pulmonary function studies not showing significant obstructive defect Despite patient's smoking history Chest x-ray shows restriction only with no evidence of active pulmonary edema or evidence for emphysema Note the patient does have obstructive sleep apnea and is on nocturnal CPAP therapy without oxygen supplementation Plan Obtain overnight oxygen study on CPAP with room air No additional inhaled medications are indicated at this time Note during ambulation the patient does not desaturate

## 2014-04-06 NOTE — Progress Notes (Signed)
Subjective:    Patient ID: Barry Haley, male    DOB: [birth year], 68 y.o.   MRN: [id number]  HPI Comments: Hx CVA 2010, meningitis. Left pt with LUE weakness.  Now with dyspnea. DX CHF. High BNP.  Pt with edema in LE. Cards eval:  Prior CABG.  No ischemia. Rx lasix , 18# loss and helped the dyspnea. 3 to 12/2013   Shortness of Breath This is a chronic problem. The current episode started more than 1 year ago. The problem occurs daily (now is dyspneic at rest and exertion). The problem has been gradually improving. Associated symptoms include leg swelling, rhinorrhea, sputum production and wheezing. Pertinent negatives include no abdominal pain, chest pain, claudication, fever, headaches, hemoptysis, leg pain, sore throat or syncope. The symptoms are aggravated by lying flat, any activity, weather changes, smoke and URIs (activity: if bend over, after walking to mailbox or in a store). Associated symptoms comments: Mucus was yellow, flonase helps. Risk factors include smoking. He has tried nothing for the symptoms. His past medical history is significant for CAD and a heart failure. There is no history of allergies, asthma, bronchiolitis, COPD, DVT, PE or pneumonia.    Past Medical History  Diagnosis Date  . Hypertension   . Hyperlipidemia   . Diabetes   . Cerebral aneurysm, nonruptured   . Epistaxis   . Anxiety state, unspecified   . Unspecified cerebral artery occlusion with cerebral infarction   . Other shock without mention of trauma   . Personal history of other disorder of urinary system   . Personal history of other diseases of digestive system   . Hx of migraines   . Sleep apnea   . Meningitis 2010     Family History  Problem Relation Age of Onset  . Heart failure Mother   . Diabetes Mother   . Cervical cancer Mother   . Alzheimer's disease Father   . Diabetes Father   . Diabetes Brother      History   Social History  . Marital Status: Married    Spouse Name:  Joelene Millin    Number of Children: 1  . Years of Education: HS   Occupational History  . Retired Other   Social History Main Topics  . Smoking status: Former Smoker -- 2.50 packs/day for 40 years    Types: Cigarettes    Quit date: 10/04/2008  . Smokeless tobacco: Never Used  . Alcohol Use: Yes     Comment: quit: 10/04/2008- rarely now  . Drug Use: No  . Sexual Activity: Not on file   Other Topics Concern  . Not on file   Social History Narrative   Patient is married Joelene Millin) and lives at home with his wife.   Patient has one adult child.   Patient is retired.   Patient has a high school education.   Patient is right-handed.   Caffeine Use: Sodas- 3 cups   Patient lives at home with family.      Allergies  Allergen Reactions  . Hctz [Hydrochlorothiazide]     Renal insufficiency  . Niaspan [Niacin Er]     itching  . Zolpidem Anxiety    Bad dreams     Outpatient Prescriptions Prior to Visit  Medication Sig Dispense Refill  . amLODipine (NORVASC) 5 MG tablet Take 2.5 mg by mouth daily.       Marland Kitchen aspirin 81 MG tablet Take 81 mg by mouth daily.      Marland Kitchen atorvastatin (LIPITOR)  10 MG tablet Take 10 mg by mouth at bedtime.      . citalopram (CELEXA) 40 MG tablet Take 1 tablet (40 mg total) by mouth daily.  90 tablet  3  . clonazePAM (KLONOPIN) 0.5 MG tablet Take 1 tablet (0.5 mg total) by mouth every 8 (eight) hours as needed.  30 tablet  5  . diltiazem (CARDIZEM CD) 240 MG 24 hr capsule Take 240 mg by mouth daily.      Marland Kitchen esomeprazole (NEXIUM) 40 MG capsule Take 40 mg by mouth daily.        Marland Kitchen glimepiride (AMARYL) 2 MG tablet Take 2 mg by mouth daily with breakfast.      . metoprolol succinate (TOPROL-XL) 50 MG 24 hr tablet Take 100 mg by mouth daily. Take with or immediately following a meal.      . sitaGLIPtin (JANUVIA) 50 MG tablet Take 50 mg by mouth daily.      . Tamsulosin HCl (FLOMAX) 0.4 MG CAPS Take 0.4 mg by mouth at bedtime.        . vitamin B-12 (CYANOCOBALAMIN) 1000  MCG tablet Take 1,000 mcg by mouth daily.         No facility-administered medications prior to visit.      Review of Systems  Constitutional: Positive for fatigue. Negative for fever.  HENT: Positive for postnasal drip, rhinorrhea and trouble swallowing. Negative for sinus pressure, sore throat and voice change.   Respiratory: Positive for apnea, cough, sputum production, choking, shortness of breath and wheezing. Negative for hemoptysis.        Will aspirate on water Hx of osa Hx of difficult airway  Cardiovascular: Positive for leg swelling. Negative for chest pain, claudication and syncope.  Gastrointestinal: Negative for abdominal pain.       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       Objective:   Physical Exam Filed Vitals:   04/06/14 0956  BP: 110/60  Pulse: 52  Temp: 98.1 F (36.7 C)  TempSrc: Oral  Height: 5' 9.5" (1.765 m)  Weight: 230 lb 6.4 oz (104.509 kg)  SpO2: 97%    Gen: Pleasant, obese, in no distress,  normal affect  ENT: No lesions,  mouth clear,  oropharynx clear, no postnasal drip  Neck: No JVD, no TMG, no carotid bruits  Lungs: No use of accessory muscles, no dullness to percussion, clear without rales or rhonchi  Cardiovascular: RRR, heart sounds normal, no murmur or gallops, no peripheral edema  Abdomen: soft and NT, no HSM,  BS normal  Musculoskeletal: No deformities, no cyanosis or clubbing  Neuro: alert, non focal  Skin: Warm, no lesions or rashes  No results found.  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 No  evidence of primary lung disease with pulmonary function studies not showing significant obstructive defect Despite patient's smoking history Chest x-ray shows restriction only with no evidence of active pulmonary edema or evidence for emphysema Note the patient does have obstructive sleep apnea and is on nocturnal CPAP therapy without oxygen supplementation Plan Obtain overnight oxygen study on CPAP with room air No additional  inhaled medications are indicated at this time Note during ambulation the patient does not desaturate   Updated Medication List Outpatient Encounter Prescriptions as of 04/06/2014  Medication Sig  . amLODipine (NORVASC) 5 MG tablet Take 2.5 mg by mouth daily.   Marland Kitchen aspirin 81 MG tablet Take 81 mg by mouth daily.  Marland Kitchen atorvastatin (LIPITOR) 10 MG tablet Take 10 mg by mouth at bedtime.  . citalopram (CELEXA) 40 MG tablet Take 1 tablet (40 mg total) by mouth daily.  . clonazePAM (KLONOPIN) 0.5 MG tablet Take 1 tablet (0.5 mg total) by mouth every 8 (eight) hours as needed.  . diltiazem (CARDIZEM CD) 240 MG 24 hr capsule Take 240 mg by mouth daily.  Marland Kitchen esomeprazole (NEXIUM) 40 MG capsule Take 40 mg by mouth daily.    . fluticasone (VERAMYST) 27.5 MCG/SPRAY nasal spray Place 2 sprays into the nose daily.  . furosemide (LASIX) 40 MG tablet Take 40 mg by mouth daily.  Marland Kitchen glimepiride (AMARYL) 2 MG tablet Take 2 mg by mouth daily with breakfast.  . metoprolol succinate (TOPROL-XL) 50 MG 24 hr tablet Take 100 mg by mouth daily. Take with or immediately following a meal.  . sitaGLIPtin (JANUVIA) 50 MG tablet Take 50 mg by mouth daily.  . Tamsulosin HCl (FLOMAX) 0.4 MG CAPS Take 0.4 mg by mouth at bedtime.    . vitamin B-12 (CYANOCOBALAMIN) 1000 MCG tablet Take 1,000 mcg by mouth daily.

## 2014-04-06 NOTE — Patient Instructions (Signed)
Stay on cpap Stay on all other medications You do not have any primary lung disease We will have Overnight oxygen test done on cpap to see if you need oxygen  Return as needed

## 2014-04-15 ENCOUNTER — Telehealth: Payer: Self-pay | Admitting: Neurology

## 2014-04-15 DIAGNOSIS — H547 Unspecified visual loss: Secondary | ICD-10-CM | POA: Diagnosis not present

## 2014-04-15 DIAGNOSIS — G4733 Obstructive sleep apnea (adult) (pediatric): Secondary | ICD-10-CM | POA: Diagnosis not present

## 2014-04-15 NOTE — Telephone Encounter (Signed)
Called by pt PCP Dr. Lin Landsman at [phone] for pt seems newly onset hemianopia concerning for stroke. I called Dr. Lin Landsman back and he stated that pt visited his ophthalmologist Dr. Julio Alm and was told that he had new onset left hemianopia, consistent with new stroke at right occipital. I also called Dr. Julio Alm at [phone] and he stated that pt came in today in clinic hitting objects in his left visual field and on exam he had left hemianopia. No emboli was seen under fundus exam. He concerned that pt likely had acute stroke about one week ago. Due to the complex stroke history, Dr. Lin Landsman would like [place] to see pt ASAP. I offered pt appointment tomorrow at 2pm. Called pt on the phone in file, nobody picked up and a voice message left for pt to call back to confirm the appointment. Dr. Julio Alm also would like a copy of the clinic visit to fax to him at [phone].

## 2014-04-16 ENCOUNTER — Encounter: Payer: Self-pay | Admitting: Neurology

## 2014-04-16 ENCOUNTER — Ambulatory Visit (INDEPENDENT_AMBULATORY_CARE_PROVIDER_SITE_OTHER): Payer: Medicare Other | Admitting: Neurology

## 2014-04-16 VITALS — BP 142/60 | HR 48 | Ht 69.75 in | Wt 223.4 lb

## 2014-04-16 DIAGNOSIS — I671 Cerebral aneurysm, nonruptured: Secondary | ICD-10-CM

## 2014-04-16 DIAGNOSIS — I6340 Cerebral infarction due to embolism of unspecified cerebral artery: Secondary | ICD-10-CM

## 2014-04-16 DIAGNOSIS — N189 Chronic kidney disease, unspecified: Secondary | ICD-10-CM | POA: Diagnosis not present

## 2014-04-16 DIAGNOSIS — E1122 Type 2 diabetes mellitus with diabetic chronic kidney disease: Secondary | ICD-10-CM

## 2014-04-16 DIAGNOSIS — E1129 Type 2 diabetes mellitus with other diabetic kidney complication: Secondary | ICD-10-CM | POA: Diagnosis not present

## 2014-04-16 DIAGNOSIS — Z8679 Personal history of other diseases of the circulatory system: Secondary | ICD-10-CM

## 2014-04-16 HISTORY — DX: Cerebral aneurysm, nonruptured: I67.1

## 2014-04-16 MED ORDER — ASPIRIN 325 MG PO TABS: 325.0000 mg | ORAL_TABLET | Freq: Every day | ORAL | Status: DC

## 2014-04-16 NOTE — Progress Notes (Signed)
STROKE NEUROLOGY FOLLOW UP NOTE  NAME: Barry Haley DOB: 27-Sep-1945  REASON FOR VISIT: stroke follow up HISTORY FROM: daughter, pt and chart  Today we had the pleasure of seeing Barry Haley in follow-up at our Neurology Clinic. Pt was accompanied by daughter and wife.   History Summary Barry Haley is a 68 year old Caucasian male with complicated past medical history including hypertension, hyperlipidemia, diabetes, sleep apnea.   In 2010, as per daughter, he had headache with fever and diagnosed with bacterial meningitis, and was treated with antibiotics. Later, he was admitted in 12/2008 due to bell's palsy, but MRI brain at that time showed Multiple areas of focal susceptibility compatible with blood products, predominately in the left hemisphere and left cerebellum as well as a single right frontal lesion was also seen. The pattern more consistent with endocarditis at that time. LP negative, and TEE done did not show vegetations. Even though, the endocarditis suspicion is still very high. He was treated with antibiotics.  In 2011, he was admitted in [facility] due to right occipital stroke with hemorrhagic transformation showing in MRI. He had left hemianopsia for a while but then fully recovered. Stroke work up done but no source of emboli found (did not see any TEE though). He was put on ASA 66m for stroke prevention. 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 Repeat CTA in 2012 showed stable aneurysms.   [facility]. According to the record: "HPI: Barry Slimpis a 68year old Caucasian male with a past medical history of hypertension, diabetes, CVA, GERD, anxiety, HLD, and BPH [facility]. The patient reports a one-week history of fevers and chills along with nausea. Per his wife, he had some burning sensation with urination for about four days as well and several day history of diarrhea. He went to his PCP on Monday and was told to report to the ER if his symptoms worsened. Yesterday afternoon he started to feel dizzy, lightheaded, and almost passed out. He did have associated palpitations during that time and went to the ED. Per his wife, he seemed more confused than usual and is having progressively worsening weakness. Since Monday he has been using a walker due to weakness.  At the outside hospital, the patient's vital signs were stable. Labs were notable for sodium of 127, potassium 3, CO2 17, BUN 61, initial creatinine of 3, wbc 14.9, hemoglobin 11.4, and platelets of 83. Initial troponin was 0.77 which trended up to1.08. UA was negative and EKG showed nonspecific ST changes. Chest x-ray showed no acute disease. The patient had two blood cultures that were positive for gram-positive cocci in clusters. He was initially started on ceftriaxone, azithromycin. When his blood cultures resulted positive for GPCs this morning the patient was started on linezolid. Echo was done showing EF 55%, left ventricular impaired filling, dilated left atrium and a mass in the left atrium that was 2cm x 1.3cm on the lateral wall. He had a right upper quadrant ultrasound done showing trace gallstones and sludge. Renal ultrasound was done which was normal. [facility]  for further management. Further workup at [facility] included a TTE that revealed a 1.2 x 2.4 cm mass seen on posterior mitral leaflet and mild mitral regurgitation. EF was 60-65%. Cardiothoracic surgery was consulted on 01/21/13. 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 It was felt that the patient was  a good candidate for Procedure(s): Mitral Valve Repair using a Simplici-T Annuloplasty Band Tricuspid Valve Repair using a Simplic-T Annuloplasty Band. After discussion of risks, benefits and indications as well as the nature of the procedure, the patient demonstrated understanding and gave informed consent to proceed.  On 01/27/2013, Barry Haley was taken to the OR for the following procedure:Debridement of left endoatrial wall and posterior mitral leaflet, Mitral valve repair with neo chord to anterior leaflet and placement of Simplici-T annuloplasty band, Tricuspid valve annuloplasty. Post procedure intraoperative transesophageal echocardiogram demonstrated an ejection fraction of 55% and there was no mitral or tricuspid regurgitation. The right ventricular function was normal. Barry Haley tolerated the procedure well and was transported in critical but stable condition to the 5A ICU for immediate post operative recovery."   After stabilization, he was later discharged to SNF. Since then, he has followed up with Dr. Maylon Peppers, cardiologist in [facility] and was doing well. Last month (03/2014), he returned to see Dr. Maylon Peppers for SOB and leg swelling, repeat TTE showed good EF and no valvular vegetation. He was considered to have CHF and added on lasix $Remove'20mg'CfHZRiu$ .   About one week ago, he started to see floaters in his left eye field, with different objects, round or diamond shape, and also seeing peoples at behind his head, vivid images, so that he frequently looking back to see if there are people there. He also bump into things on the left. He went to see ophthalmologist Dr. Lurena Nida, found to have left hemianopia, concerning for stroke. He went to see his PCP Dr. Lin Landsman afterward, and was referred here for urgent evaluation. He has been followed up in this clinic in the past for stroke, last visit 03/24/14. He was also seen by Dr. Rush Barer for OSA.    REVIEW OF SYSTEMS: Full 14 system review of systems performed and  notable only for those listed below and in HPI above, all others are negative:  Constitutional: N/A  Cardiovascular: N/A  Ear/Nose/Throat: N/A  Skin: N/A  Eyes: Loss of vision  Respiratory: Shortness of breath  Gastroitestinal: N/A  Genitourinary: Bladder incontinence, frequent urination Hematology/Lymphatic: Anemia  Endocrine: Excessive thrist  Musculoskeletal: N/A  Allergy/Immunology: N/A  Neurological: Insomnia, apnea, frequent walking, daytime sleepiness, snoring, memory loss, tremors Psychiatric: Education, nervous/anxious  The following represents the patient's updated allergies and side effects list: Allergies  Allergen Reactions  . Hctz [Hydrochlorothiazide]     Renal insufficiency  . Niaspan [Niacin Er]     itching  . Zolpidem Anxiety    Bad dreams    Labs since last visit of relevance include the following: Results for orders placed during the hospital encounter of 01/04/10  URINE CULTURE      Result Value Ref Range   Specimen Description URINE, CLEAN CATCH     Special Requests NONE     Colony Count NO GROWTH     Culture NO GROWTH     Report Status 01/05/2010 FINAL    CULTURE, BLOOD (ROUTINE X 2)      Result Value Ref Range   Specimen Description BLOOD LEFT ARM     Special Requests BOTTLES DRAWN AEROBIC ONLY 10CC     Culture  NO GROWTH 5 DAYS     Report Status 01/10/2010 FINAL    CULTURE, BLOOD (ROUTINE X 2)      Result Value Ref Range   Specimen Description BLOOD RIGHT HAND     Special Requests BOTTLES DRAWN AEROBIC ONLY 10CC     Culture NO GROWTH 5 DAYS     Report Status 01/10/2010 FINAL    MRSA PCR SCREENING      Result Value Ref Range   MRSA by PCR    NEGATIVE   Value: NEGATIVE            The GeneXpert MRSA Assay (FDA     approved for NASAL specimens     only), is one component of a     comprehensive MRSA colonization     surveillance program. It is not     intended to diagnose MRSA     infection nor to guide or     monitor treatment for      MRSA infections.  MRSA PCR SCREENING      Result Value Ref Range   MRSA by PCR    NEGATIVE   Value: NEGATIVE            The GeneXpert MRSA Assay (FDA     approved for NASAL specimens     only), is one component of a     comprehensive MRSA colonization     surveillance program. It is not     intended to diagnose MRSA     infection nor to guide or     monitor treatment for     MRSA infections.  CBC      Result Value Ref Range   WBC 7.9  4.0 - 10.5 K/uL   RBC 3.72 (*) 4.22 - 5.81 MIL/uL   Hemoglobin 11.0 (*) 13.0 - 17.0 g/dL   HCT 31.9 (*) 39.0 - 52.0 %   MCV 85.8  78.0 - 100.0 fL   MCHC 34.5  30.0 - 36.0 g/dL   RDW 14.3  11.5 - 15.5 %   Platelets 264  150 - 400 K/uL  DIFFERENTIAL      Result Value Ref Range   Neutrophils Relative % 73  43 - 77 %   Neutro Abs 5.8  1.7 - 7.7 K/uL   Lymphocytes Relative 16  12 - 46 %   Lymphs Abs 1.3  0.7 - 4.0 K/uL   Monocytes Relative 8  3 - 12 %   Monocytes Absolute 0.7  0.1 - 1.0 K/uL   Eosinophils Relative 2  0 - 5 %   Eosinophils Absolute 0.1  0.0 - 0.7 K/uL   Basophils Relative 0  0 - 1 %   Basophils Absolute 0.0  0.0 - 0.1 K/uL  PROTIME-INR      Result Value Ref Range   Prothrombin Time 15.1  11.6 - 15.2 seconds   INR 1.20  0.00 - 1.49  APTT      Result Value Ref Range   aPTT 29  24 - 37 seconds  URINALYSIS, ROUTINE W REFLEX MICROSCOPIC      Result Value Ref Range   Color, Urine AMBER BIOCHEMICALS MAY BE AFFECTED BY COLOR (*) YELLOW   APPearance CLOUDY (*) CLEAR   Specific Gravity, Urine 1.029  1.005 - 1.030   pH 5.5  5.0 - 8.0   Glucose, UA NEGATIVE  NEGATIVE mg/dL   Hgb urine dipstick LARGE (*) NEGATIVE   Bilirubin Urine SMALL (*) NEGATIVE  Ketones, ur 15 (*) NEGATIVE mg/dL   Protein, ur 100 (*) NEGATIVE mg/dL   Urobilinogen, UA 0.2  0.0 - 1.0 mg/dL   Nitrite NEGATIVE  NEGATIVE   Leukocytes, UA NEGATIVE  NEGATIVE  URINE MICROSCOPIC-ADD ON      Result Value Ref Range   Squamous Epithelial / LPF FEW (*) RARE   WBC, UA  3-6  <3 WBC/hpf   RBC / HPF 21-50  <3 RBC/hpf   Bacteria, UA RARE  RARE   Casts HYALINE CASTS (*) NEGATIVE   Urine-Other MUCOUS PRESENT    DRUGS OF ABUSE SCRN W/O ALCOHOL,UR-SLN      Result Value Ref Range   Marijuana Metabolite NEGATIVE  Negative   Amphetamine Screen, Ur NEGATIVE  Negative   Barbiturate Quant, Ur NEGATIVE  Negative   Methadone NEGATIVE  Negative   Benzodiazepines. NEGATIVE  Negative   Phencyclidine (PCP) NEGATIVE  Negative   Cocaine Metabolites NEGATIVE  Negative   Opiate Screen, Urine NEGATIVE  Negative   Propoxyphene NEGATIVE  Negative   Creatinine,U       Value: 234.7     (NOTE)  Cutoff Values for Urine Drug Screen:        Drug Class           Cutoff (ng/mL)        Amphetamines            1000        Barbiturates             200        Cocaine Metabolites      300        Benzodiazepines          200        Methadone                     300        Opiates                 2000        Phencyclidine             25        Propoxyphene             300        Marijuana Metabolites     50  For medical purposes only.  CBC      Result Value Ref Range   WBC 7.9  4.0 - 10.5 K/uL   RBC 3.57 (*) 4.22 - 5.81 MIL/uL   Hemoglobin 10.6 (*) 13.0 - 17.0 g/dL   HCT 30.7 (*) 39.0 - 52.0 %   MCV 86.0  78.0 - 100.0 fL   MCHC 34.7  30.0 - 36.0 g/dL   RDW 14.1  11.5 - 15.5 %   Platelets 239  150 - 400 K/uL  COMPREHENSIVE METABOLIC PANEL      Result Value Ref Range   Sodium 136  135 - 145 mEq/L   Potassium 3.4 (*) 3.5 - 5.1 mEq/L   Chloride 102  96 - 112 mEq/L   CO2 27  19 - 32 mEq/L   Glucose, Bld 108 (*) 70 - 99 mg/dL   BUN 9  6 - 23 mg/dL   Creatinine, Ser 0.96  0.4 - 1.5 mg/dL   Calcium 8.1 (*) 8.4 - 10.5 mg/dL   Total Protein 6.2  6.0 - 8.3 g/dL   Albumin 2.3 (*) 3.5 - 5.2 g/dL  AST 29  0 - 37 U/L   ALT 16  0 - 53 U/L   Alkaline Phosphatase 189 (*) 39 - 117 U/L   Total Bilirubin 0.5  0.3 - 1.2 mg/dL   GFR calc non Af Amer >60  >60 mL/min   GFR calc Af Amer    >60 mL/min    Value: >60            The eGFR has been calculated     using the MDRD equation.     This calculation has not been     validated in all clinical     situations.     eGFR's persistently     <60 mL/min signify     possible Chronic Kidney Disease.  DIFFERENTIAL      Result Value Ref Range   Neutrophils Relative % 70  43 - 77 %   Neutro Abs 5.5  1.7 - 7.7 K/uL   Lymphocytes Relative 19  12 - 46 %   Lymphs Abs 1.5  0.7 - 4.0 K/uL   Monocytes Relative 9  3 - 12 %   Monocytes Absolute 0.7  0.1 - 1.0 K/uL   Eosinophils Relative 2  0 - 5 %   Eosinophils Absolute 0.1  0.0 - 0.7 K/uL   Basophils Relative 1  0 - 1 %   Basophils Absolute 0.0  0.0 - 0.1 K/uL  HIV ANTIBODY      Result Value Ref Range   HIV NON REACTIVE  NON REACTIVE  LIPID PANEL      Result Value Ref Range   Cholesterol    0 - 200 mg/dL   Value: 112            ATP III CLASSIFICATION:      <200     mg/dL   Desirable      200-239  mg/dL   Borderline High      >=240    mg/dL   High              Triglycerides 112  <150 mg/dL   HDL 18 (*) >39 mg/dL   Total CHOL/HDL Ratio 6.2     VLDL 22  0 - 40 mg/dL   LDL Cholesterol    0 - 99 mg/dL   Value: 72            Total Cholesterol/HDL:CHD Risk     Coronary Heart Disease Risk Table                         Men   Women      1/2 Average Risk   3.4   3.3      Average Risk       5.0   4.4      2 X Average Risk   9.6   7.1      3 X Average Risk  23.4   11.0                Use the calculated Patient Ratio     above and the CHD Risk Table     to determine the patient's CHD Risk.                ATP III CLASSIFICATION (LDL):      <100     mg/dL   Optimal      100-129  mg/dL   Near or Above  Optimal      130-159  mg/dL   Borderline      160-189  mg/dL   High      >190     mg/dL   Very High  BASIC METABOLIC PANEL      Result Value Ref Range   Sodium 135  135 - 145 mEq/L   Potassium 3.5  3.5 - 5.1 mEq/L   Chloride 99  96 - 112 mEq/L   CO2 29  19 - 32  mEq/L   Glucose, Bld 118 (*) 70 - 99 mg/dL   BUN 14  6 - 23 mg/dL   Creatinine, Ser 1.00  0.4 - 1.5 mg/dL   Calcium 8.3 (*) 8.4 - 10.5 mg/dL   GFR calc non Af Amer >60  >60 mL/min   GFR calc Af Amer    >60 mL/min   Value: >60            The eGFR has been calculated     using the MDRD equation.     This calculation has not been     validated in all clinical     situations.     eGFR's persistently     <60 mL/min signify     possible Chronic Kidney Disease.  BASIC METABOLIC PANEL      Result Value Ref Range   Sodium 134 (*) 135 - 145 mEq/L   Potassium 3.6  3.5 - 5.1 mEq/L   Chloride 99  96 - 112 mEq/L   CO2 29  19 - 32 mEq/L   Glucose, Bld 101 (*) 70 - 99 mg/dL   BUN 13  6 - 23 mg/dL   Creatinine, Ser 0.98  0.4 - 1.5 mg/dL   Calcium 8.5  8.4 - 10.5 mg/dL   GFR calc non Af Amer >60  >60 mL/min   GFR calc Af Amer    >60 mL/min   Value: >60            The eGFR has been calculated     using the MDRD equation.     This calculation has not been     validated in all clinical     situations.     eGFR's persistently     <60 mL/min signify     possible Chronic Kidney Disease.  BASIC METABOLIC PANEL      Result Value Ref Range   Sodium 133 (*) 135 - 145 mEq/L   Potassium 4.1  3.5 - 5.1 mEq/L   Chloride 96  96 - 112 mEq/L   CO2 27  19 - 32 mEq/L   Glucose, Bld 113 (*) 70 - 99 mg/dL   BUN 14  6 - 23 mg/dL   Creatinine, Ser 1.04  0.4 - 1.5 mg/dL   Calcium 8.9  8.4 - 10.5 mg/dL   GFR calc non Af Amer >60  >60 mL/min   GFR calc Af Amer    >60 mL/min   Value: >60            The eGFR has been calculated     using the MDRD equation.     This calculation has not been     validated in all clinical     situations.     eGFR's persistently     <60 mL/min signify     possible Chronic Kidney Disease.  BASIC METABOLIC PANEL      Result Value Ref Range   Sodium 129 (*) 135 - 145  mEq/L   Potassium 4.4  3.5 - 5.1 mEq/L   Chloride 97  96 - 112 mEq/L   CO2 27  19 - 32 mEq/L   Glucose,  Bld 114 (*) 70 - 99 mg/dL   BUN 20  6 - 23 mg/dL   Creatinine, Ser 1.07  0.4 - 1.5 mg/dL   Calcium 8.7  8.4 - 10.5 mg/dL   GFR calc non Af Amer >60  >60 mL/min   GFR calc Af Amer    >60 mL/min   Value: >60            The eGFR has been calculated     using the MDRD equation.     This calculation has not been     validated in all clinical     situations.     eGFR's persistently     <60 mL/min signify     possible Chronic Kidney Disease.  CBC      Result Value Ref Range   WBC 7.2  4.0 - 10.5 K/uL   RBC 3.82 (*) 4.22 - 5.81 MIL/uL   Hemoglobin 11.4 (*) 13.0 - 17.0 g/dL   HCT 33.0 (*) 39.0 - 52.0 %   MCV 86.6  78.0 - 100.0 fL   MCHC 34.4  30.0 - 36.0 g/dL   RDW 14.1  11.5 - 15.5 %   Platelets 289  150 - 400 K/uL  PROTIME-INR      Result Value Ref Range   Prothrombin Time 14.5  11.6 - 15.2 seconds   INR 1.14  0.00 - 1.49  APTT      Result Value Ref Range   aPTT 27  24 - 37 seconds  PROTEIN ELECTROPHORESIS, SERUM      Result Value Ref Range   Total Protein ELP 6.7  6.0 - 8.3 g/dL   Albumin ELP 47.0 (*) 55.8 - 66.1 %   Alpha-1-Globulin 9.1 (*) 2.9 - 4.9 %   Alpha-2-Globulin 11.7  7.1 - 11.8 %   Beta Globulin 7.3 (*) 4.7 - 7.2 %   Beta 2 6.6 (*) 3.2 - 6.5 %   Gamma Globulin 18.3  11.1 - 18.8 %   M-Spike, % NOT DETECTED     SPE Interp.       Value: (NOTE) The possibility of a faint restricted band(s) cannot be completely excluded in the gamma region.  Suggest serum IFE to evaluate possibility, if clinically indicated. (Lab will hold sample one week. Results are consistent with SPE performed on      11/11/08. Reviewed by Odis Hollingshead, MD, PhD, FCAP (Electronic Signature on File)   Comment       Value: (NOTE) --------------- Serum protein electrophoresis is a useful screening procedure in the detection of various pathophysiologic states such as inflammation, gammopathies, protein loss and other dysproteinemias.  Immunofixation electrophoresis (IFE) is      a more sensitive  technique for the identification of M-proteins found in patients with monoclonal gammopathy of unknown significance (MGUS), amyloidosis, early or treated myeloma or macroglobulinemia, solitary plasmacytoma or extramedullary plasmacytoma.  BASIC METABOLIC PANEL      Result Value Ref Range   Sodium 135  135 - 145 mEq/L   Potassium 4.5  3.5 - 5.1 mEq/L   Chloride 101  96 - 112 mEq/L   CO2 27  19 - 32 mEq/L   Glucose, Bld 109 (*) 70 - 99 mg/dL   BUN 21  6 - 23 mg/dL   Creatinine, Ser  1.16  0.4 - 1.5 mg/dL   Calcium 9.2  8.4 - 10.5 mg/dL   GFR calc non Af Amer >60  >60 mL/min   GFR calc Af Amer    >60 mL/min   Value: >60            The eGFR has been calculated     using the MDRD equation.     This calculation has not been     validated in all clinical     situations.     eGFR's persistently     <60 mL/min signify     possible Chronic Kidney Disease.  CBC      Result Value Ref Range   WBC 7.7  4.0 - 10.5 K/uL   RBC 4.17 (*) 4.22 - 5.81 MIL/uL   Hemoglobin 12.3 (*) 13.0 - 17.0 g/dL   HCT 36.0 (*) 39.0 - 52.0 %   MCV 86.3  78.0 - 100.0 fL   MCHC 34.1  30.0 - 36.0 g/dL   RDW 14.2  11.5 - 15.5 %   Platelets 304  150 - 400 K/uL  DIFFERENTIAL      Result Value Ref Range   Neutrophils Relative % 66  43 - 77 %   Neutro Abs 5.1  1.7 - 7.7 K/uL   Lymphocytes Relative 21  12 - 46 %   Lymphs Abs 1.6  0.7 - 4.0 K/uL   Monocytes Relative 10  3 - 12 %   Monocytes Absolute 0.7  0.1 - 1.0 K/uL   Eosinophils Relative 3  0 - 5 %   Eosinophils Absolute 0.2  0.0 - 0.7 K/uL   Basophils Relative 1  0 - 1 %   Basophils Absolute 0.1  0.0 - 0.1 K/uL  ANTI-DNA ANTIBODY, DOUBLE-STRANDED      Result Value Ref Range   ds DNA Ab    <30 IU/mL   Value: 8     (NOTE)             <  30 IU/mL     Negative             30-40 IU/mL     Equivocal             >= 40 IU/mL     Positive  Please note change in reference range(s).    ANTI-NUCLEAR AB-TITER (ANA TITER)      Result Value Ref Range   ANA Titer 1    <1:40     Value: NEGATIVE     (NOTE)  Reference Ranges: 1:40 - 1:80 Weakly positive, usually not clinically significant. > or = to 1:160 Result may be clinically significant. Due to differences in methodologies, results may differ between the ANA screen and the Reflex IFA titer and      pattern.  ANA      Result Value Ref Range   ANA POSITIVE (*) NEGATIVE  ANTI-NEUTROPHIL ANTIBODY      Result Value Ref Range   Antinuclear antibodies SEE SEPARATE REPORT PERFORMED AT [facility]    C3 COMPLEMENT      Result Value Ref Range   C3 Complement 179  88 - 201 mg/dL  C4 COMPLEMENT      Result Value Ref Range   Complement C4, Body Fluid 23  16 - 47 mg/dL  HIGH SENSITIVITY CRP      Result Value Ref Range   CRP, High Sensitivity   (*)    Value: 4.5     (  NOTE)           <1.0 mg/L              Low risk          1.0 to 3.0 mg/L        Average risk          >3.0 mg/L              High risk  If a level of >10.0 mg/L is seen, an obvious source of infection or inflammation should be sought. If found, this      value should be disregarded.  Suggest repeat testing after resolution of infection and/or inflammation.   SEDIMENTATION RATE      Result Value Ref Range   Sed Rate 32 (*) 0 - 16 mm/hr  MPO/PR-3 (ANCA) ANTIBODIES      Result Value Ref Range   Myeloperoxidase Abs       Value: SEE SEPARATE REPORT Performed at [place]      Result Value Ref Range   Rheumatoid Factor < 20  0 - 20 IU/mL  RPR      Result Value Ref Range   RPR NON REACTIVE  NON REACTIVE  ANTI-SMITH ANTIBODY      Result Value Ref Range   ENA SM Ab Ser-aCnc    <30 AU/mL   Value: 1     (NOTE)  Please note change in reference range(s).    Please note change in reference range(s).   BASIC METABOLIC PANEL      Result Value Ref Range   Sodium 137  135 - 145 mEq/L   Potassium 4.3  3.5 - 5.1 mEq/L   Chloride 102  96 - 112 mEq/L   CO2 27  19 - 32 mEq/L   Glucose, Bld 118 (*) 70 - 99 mg/dL   BUN 25 (*) 6 - 23 mg/dL    Creatinine, Ser 1.30  0.4 - 1.5 mg/dL   Calcium 8.7  8.4 - 10.5 mg/dL   GFR calc non Af Amer 56 (*) >60 mL/min   GFR calc Af Amer    >60 mL/min   Value: >60            The eGFR has been calculated     using the MDRD equation.     This calculation has not been     validated in all clinical     situations.     eGFR's persistently     <60 mL/min signify     possible Chronic Kidney Disease.  BASIC METABOLIC PANEL      Result Value Ref Range   Sodium 137  135 - 145 mEq/L   Potassium 4.7  3.5 - 5.1 mEq/L   Chloride 105  96 - 112 mEq/L   CO2 25  19 - 32 mEq/L   Glucose, Bld 90  70 - 99 mg/dL   BUN 24 (*) 6 - 23 mg/dL   Creatinine, Ser 1.25  0.4 - 1.5 mg/dL   Calcium 8.8  8.4 - 10.5 mg/dL   GFR calc non Af Amer 58 (*) >60 mL/min   GFR calc Af Amer    >60 mL/min   Value: >60            The eGFR has been calculated     using the MDRD equation.     This calculation has not been     validated in all clinical  situations.     eGFR's persistently     <60 mL/min signify     possible Chronic Kidney Disease.  POCT I-STAT, CHEM 8      Result Value Ref Range   Sodium 138  135 - 145 mEq/L   Potassium 3.6  3.5 - 5.1 mEq/L   Chloride 100  96 - 112 mEq/L   BUN 9  6 - 23 mg/dL   Creatinine, Ser 0.9  0.4 - 1.5 mg/dL   Glucose, Bld 105 (*) 70 - 99 mg/dL   Calcium, Ion 1.13  1.12 - 1.32 mmol/L   TCO2 28  0 - 100 mmol/L   Hemoglobin 11.2 (*) 13.0 - 17.0 g/dL   HCT 33.0 (*) 39.0 - 52.0 %    The neurologically relevant items on the patient's problem list were reviewed on today's visit.  Neurologic Examination  A problem focused neurological exam (12 or more points of the single system neurologic examination, vital signs counts as 1 point, cranial nerves count for 8 points) was performed.  Blood pressure 142/60, pulse 48, height 5' 9.75" (1.772 m), weight 223 lb 6.4 oz (101.334 kg).  General - Well nourished, well developed, in no apparent distress.  Ophthalmologic - pt keep moving  eyes, not able to see.  Cardiovascular - Regular rate and rhythm with no murmur.  Mental Status -  Level of arousal and orientation to time, place, and person were intact. Language including expression, naming, repetition, comprehension was assessed and found intact. Attention span and concentration were normal. Recent and remote memory were 3/3 registration and 2/3 delayed recall.  Cranial Nerves II - XII - II - Visual field testing showed left hemianopia. III, IV, VI - Extraocular movements intact. V - Facial sensation intact bilaterally. VII - Facial movement intact bilaterally. VIII - Hearing & vestibular intact bilaterally. X - Palate elevates symmetrically. XI - Chin turning & shoulder shrug intact bilaterally. XII - Tongue protrusion intact.  Motor Strength - The patient's strength was normal in all extremities and pronator drift was absent.  Bulk was normal and fasciculations were absent.   Motor Tone - Muscle tone was assessed at the neck and appendages and was normal.  Reflexes - The patient's reflexes were normal in all extremities and he had no pathological reflexes.  Sensory - Light touch, temperature/pinprick, and Romberg testing were assessed and were normal.    Coordination - The patient had normal movements in the hands and feet with no ataxia or dysmetria.  Tremor was absent.  Gait and Station - wide based gait, hesitate stride due to hemianopia.  Data reviewed: I personally reviewed the images and agree with the radiology interpretations.  See above mentioned in HPI.  Assessment: As you may recall, he is a 68 y.o. Caucasian male with PMH of hypertension, hyperlipidemia, diabetes, sleep apnea. 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 Followup with aneurysms by  imaging, seems to be stable. However he was admitted in 2014 in [facility] due to endocarditis, s/p of MV and TV repair, and course of antibiotics. Recently, he developed CHF symptoms, was put on lasix but TTE was normal. For the last week, he started to have visual hallucinations at left visual field in the setting of left hemianopia, concerning for new right occipital stroke. Will do MRI and  MRA for evaluation. 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 Will do EEG. However, with concern of new infarct, the etiology will be reconsidered. Will do TEE again to see if there is any residue vegetation or recurrent endocarditis. Will do blood culture too.  if there is no endocarditis, he may be candidate for coumadin although most previous stroke are attributed to endocarditis. Will also need endovascular neurosurgeon to evaluate the aneurysms to see if he is a coumadin candidate if we have to go that route. He also need OT for visual follow up and driving clearance in the future.  Plan:  - Will schedule MRI brain with and without contrast, MRA head without contrast and neck with contrast - Will need schedule with Dr. Kathyrn Sheriff for evaluation of aneurysm for potential coumadin use - Out pt OT for visual field cut and driving restrains - Can not drive until OT clearance. -  Will do blood culture x 2, A1C, TSH and lipid panel for stroke work up.  - Will schedule TEE to rule out recurrent endocarditis.  - Also schedule EEG to rule out seizure - RTC in one month.  Orders Placed This Encounter  Procedures  . Culture, Blood  . Culture, Blood  . MR Brain W Wo Contrast    Standing Status: Future     Number of Occurrences:      Standing Expiration Date: 06/18/2015    Order Specific Question:  Reason for Exam (SYMPTOM  OR DIAGNOSIS REQUIRED)    Answer:  left hemianopia, new    Order Specific Question:  Preferred imaging location?     Answer:  Internal    Order Specific Question:  Does the patient have a pacemaker or implanted devices?    Answer:  No    Order Specific Question:  What is the patient's sedation requirement?    Answer:  No Sedation  . MR MRA HEAD WO CONTRAST    Standing Status: Future     Number of Occurrences:      Standing Expiration Date: 06/18/2015    Order Specific Question:  Reason for Exam (SYMPTOM  OR DIAGNOSIS REQUIRED)    Answer:  new left hemianopia    Order Specific Question:  Preferred imaging location?    Answer:  Internal    Order Specific Question:  Does the patient have a pacemaker or implanted devices?    Answer:  No    Order Specific Question:  What is the patient's sedation requirement?    Answer:  No Sedation  . MR Angiogram Neck W Contrast    Standing Status: Future     Number of Occurrences:      Standing Expiration Date: 06/17/2015    Order Specific Question:  Reason for Exam (SYMPTOM  OR DIAGNOSIS REQUIRED)    Answer:  left new hemianopia    Order Specific Question:  Preferred imaging location?    Answer:  Internal    Order Specific Question:  Does the patient have a pacemaker or implanted devices?    Answer:  No    Order Specific Question:  What is the patient's sedation requirement?    Answer:  No Sedation  . TSH + free T4  . Lipid panel  . Hemoglobin A1c  . Ambulatory referral to Occupational Therapy    Referral Priority:  Routine    Referral Type:  Occupational Therapy    Referral Reason:  Specialty Services Required  Requested Specialty:  Occupational Therapy    Number of Visits Requested:  1  . Ambulatory referral to Neurosurgery    Referral Priority:  Urgent    Referral Type:  Surgical    Referral Reason:  Specialty Services Required    Referred to Provider:  Consuella Lose, MD    Requested Specialty:  Neurosurgery    Number of Visits Requested:  1  . Echocardiogram transesophageal    Standing Status: Future     Number of Occurrences:      Standing  Expiration Date: 04/17/2015    Scheduling Instructions:     Hx of endocarditis s/p MV and TV repair in [place], new onset left hemianopia, concerning for new stroke, would like TEE to rule out new endocarditis.    Order Specific Question:  Type of Echo    Answer:  Complete    Order Specific Question:  Reason for exam-Echo    Answer:  Mitral Valve Disorder  424.0  . EEG adult    Standing Status: Future     Number of Occurrences:      Standing Expiration Date: 04/17/2015    Patient Instructions  1. Will schedule MRI brain with and without contrast, MRA head without contrast and neck with contrast 2. Will need schedule with Dr. Kathyrn Sheriff for evaluation of aneurysm for potential coumadin use 3. Out pt OT for visual field cut and driving restrains 4. Can not drive until OT clearance. 5. Will do blood culture x 2, A1C, TSH and lipid panel for stroke work up.  6. Will schedule TEE to rule out recurrent endocarditis.  7. RTC in one month. 8. Also schedule EEG to rule out seizure.   Rosalin Hawking, MD PhD [facility] [address], [place], [place] [address] [phone]

## 2014-04-16 NOTE — Patient Instructions (Addendum)
1. Will schedule MRI brain with and without contrast, MRA head without contrast and neck with contrast 2. Will need schedule with Dr. Kathyrn Sheriff for evaluation of aneurysm for potential coumadin use 3. Out pt PT for visual field cut and driving restrains 4. Can not drive until OT clearance. 5. Will do blood culture x 2, A1C, TSH and lipid panel for stroke work up.  6. Will schedule TEE to rule out recurrent endocarditis.  7. RTC in one month. 8. Also schedule EEG to rule out seizure.

## 2014-04-17 LAB — LIPID PANEL
Chol/HDL Ratio: 5.7 ratio units — ABNORMAL HIGH (ref 0.0–5.0)
Cholesterol, Total: 142 mg/dL (ref 100–199)
HDL: 25 mg/dL — ABNORMAL LOW (ref >39)
LDL CALC: 78 mg/dL (ref 0–99)
Triglycerides: 196 mg/dL — ABNORMAL HIGH (ref 0–149)
VLDL Cholesterol Cal: 39 mg/dL (ref 5–40)

## 2014-04-17 LAB — HEMOGLOBIN A1C
ESTIMATED AVERAGE GLUCOSE: 346 mg/dL
Hgb A1c MFr Bld: 13.7 % — ABNORMAL HIGH (ref 4.8–5.6)

## 2014-04-17 LAB — TSH+FREE T4
FREE T4: 1.56 ng/dL (ref 0.82–1.77)
TSH: 1.4 u[IU]/mL (ref 0.450–4.500)

## 2014-04-19 NOTE — Telephone Encounter (Signed)
Please see phone note 04-15-14 by Dr. Erlinda Hong.

## 2014-04-20 ENCOUNTER — Other Ambulatory Visit (INDEPENDENT_AMBULATORY_CARE_PROVIDER_SITE_OTHER): Payer: Medicare Other | Admitting: Radiology

## 2014-04-20 ENCOUNTER — Telehealth: Payer: Self-pay | Admitting: Neurology

## 2014-04-20 ENCOUNTER — Telehealth: Payer: Self-pay | Admitting: Internal Medicine

## 2014-04-20 DIAGNOSIS — I6340 Cerebral infarction due to embolism of unspecified cerebral artery: Secondary | ICD-10-CM

## 2014-04-20 MED ORDER — ALPRAZOLAM 0.5 MG PO TABS: 0.5000 mg | ORAL_TABLET | Freq: Every evening | ORAL | Status: DC | PRN

## 2014-04-20 NOTE — Telephone Encounter (Signed)
I picked up message and noted that pt here for EEG.  Went and spoke to daughter, Anderson Malta as well as wife about pt.   He has had worsening sx of paranoia, disorientation.  Consulted Dr. Leonie Man, relating labs results, and ofv note.  MRI/MRA scheduled 04-27-14.  Need earlier?  Per Dr. Leonie Man yes try to get in sooner.  Called [facility] and was able to move up 3 scans to Sunday 04-25-14 at  7:15pm.  (be there 6:45pm).  Daughter confirmed.  I got release for records for Dr. Lovette Cliche at [facility] to f/u on HgbA1c.  (13.7).  Gave to daughter and she was to call pcp this afternoon.

## 2014-04-20 NOTE — Telephone Encounter (Signed)
TEE set up for 8/20 at [facility] 830am for 10am procedure with Dr.Nahser. Check in to [facility] then admitting to endo. Anderson Malta (patient's daughter) aware and advised NPO after midnight except for meds with a small amount of water. She will have him hold diabetes medication unless otherwise indicated by PCP tomorrow.  Sonja from [facility] aware.

## 2014-04-20 NOTE — Telephone Encounter (Signed)
Phone call was routed to Dr. Leonie Man nurse.

## 2014-04-20 NOTE — Telephone Encounter (Signed)
Daughter calling on patient's behalf. Patient saw Dr. Erlinda Hong last week for stroke-like symptoms and has an apt today for an EEG.  Daughter states that he is very weak and has been staying in bed which is very unusual for him. They have questions about lab work as well and want to talk to a nurse. Best numbers to call are [phone] or Work number [phone] ext. [phone] and okay to leave message at either number

## 2014-04-20 NOTE — Telephone Encounter (Signed)
New message          This pt needs an appt for a TEE

## 2014-04-20 NOTE — Telephone Encounter (Signed)
Called patient to get more information about what is  going on with her father. No one answered i left a voice asking her to call back.

## 2014-04-20 NOTE — Telephone Encounter (Signed)
Patient is requesting a sedative to take prior to MRI Dr Erlinda Hong ordered.  He is out of the office.  Forwarding request to [facility] for approval.

## 2014-04-20 NOTE — Telephone Encounter (Signed)
Left message for Barry Haley to call back to set up TEE.

## 2014-04-20 NOTE — Telephone Encounter (Signed)
Would like to know if father should still come for the sleep study in September with all that is going on with him

## 2014-04-20 NOTE — Telephone Encounter (Signed)
Needs medication for father to have MRI states he won't be able to do it with out something.

## 2014-04-21 ENCOUNTER — Other Ambulatory Visit: Payer: Self-pay | Admitting: Cardiovascular Disease

## 2014-04-21 ENCOUNTER — Telehealth: Payer: Self-pay | Admitting: *Deleted

## 2014-04-21 ENCOUNTER — Inpatient Hospital Stay (HOSPITAL_COMMUNITY): Payer: Medicare Other

## 2014-04-21 ENCOUNTER — Emergency Department (HOSPITAL_COMMUNITY): Payer: Medicare Other

## 2014-04-21 ENCOUNTER — Encounter (HOSPITAL_COMMUNITY): Payer: Self-pay | Admitting: Emergency Medicine

## 2014-04-21 ENCOUNTER — Inpatient Hospital Stay (HOSPITAL_COMMUNITY)
Admission: EM | Admit: 2014-04-21 | Discharge: 2014-04-27 | DRG: 637 | Disposition: A | Discharge status: Skilled Nursing Facility | Payer: Medicare Other | Attending: Internal Medicine | Admitting: Internal Medicine

## 2014-04-21 DIAGNOSIS — I671 Cerebral aneurysm, nonruptured: Secondary | ICD-10-CM

## 2014-04-21 DIAGNOSIS — N182 Chronic kidney disease, stage 2 (mild): Secondary | ICD-10-CM | POA: Diagnosis not present

## 2014-04-21 DIAGNOSIS — I519 Heart disease, unspecified: Secondary | ICD-10-CM | POA: Diagnosis not present

## 2014-04-21 DIAGNOSIS — E119 Type 2 diabetes mellitus without complications: Secondary | ICD-10-CM | POA: Diagnosis not present

## 2014-04-21 DIAGNOSIS — R739 Hyperglycemia, unspecified: Secondary | ICD-10-CM

## 2014-04-21 DIAGNOSIS — E1310 Other specified diabetes mellitus with ketoacidosis without coma: Principal | ICD-10-CM | POA: Diagnosis present

## 2014-04-21 DIAGNOSIS — F29 Unspecified psychosis not due to a substance or known physiological condition: Secondary | ICD-10-CM | POA: Diagnosis not present

## 2014-04-21 DIAGNOSIS — R7309 Other abnormal glucose: Secondary | ICD-10-CM | POA: Diagnosis not present

## 2014-04-21 DIAGNOSIS — F17200 Nicotine dependence, unspecified, uncomplicated: Secondary | ICD-10-CM | POA: Diagnosis present

## 2014-04-21 DIAGNOSIS — I6330 Cerebral infarction due to thrombosis of unspecified cerebral artery: Secondary | ICD-10-CM | POA: Diagnosis not present

## 2014-04-21 DIAGNOSIS — I10 Essential (primary) hypertension: Secondary | ICD-10-CM | POA: Diagnosis not present

## 2014-04-21 DIAGNOSIS — N179 Acute kidney failure, unspecified: Secondary | ICD-10-CM | POA: Diagnosis not present

## 2014-04-21 DIAGNOSIS — I639 Cerebral infarction, unspecified: Secondary | ICD-10-CM

## 2014-04-21 DIAGNOSIS — I5021 Acute systolic (congestive) heart failure: Secondary | ICD-10-CM | POA: Diagnosis not present

## 2014-04-21 DIAGNOSIS — E1122 Type 2 diabetes mellitus with diabetic chronic kidney disease: Secondary | ICD-10-CM

## 2014-04-21 DIAGNOSIS — E669 Obesity, unspecified: Secondary | ICD-10-CM | POA: Diagnosis present

## 2014-04-21 DIAGNOSIS — Z79899 Other long term (current) drug therapy: Secondary | ICD-10-CM | POA: Diagnosis not present

## 2014-04-21 DIAGNOSIS — I509 Heart failure, unspecified: Secondary | ICD-10-CM | POA: Diagnosis present

## 2014-04-21 DIAGNOSIS — I6529 Occlusion and stenosis of unspecified carotid artery: Secondary | ICD-10-CM | POA: Diagnosis not present

## 2014-04-21 DIAGNOSIS — R269 Unspecified abnormalities of gait and mobility: Secondary | ICD-10-CM | POA: Diagnosis not present

## 2014-04-21 DIAGNOSIS — E1165 Type 2 diabetes mellitus with hyperglycemia: Secondary | ICD-10-CM | POA: Diagnosis not present

## 2014-04-21 DIAGNOSIS — E869 Volume depletion, unspecified: Secondary | ICD-10-CM | POA: Diagnosis present

## 2014-04-21 DIAGNOSIS — Z9889 Other specified postprocedural states: Secondary | ICD-10-CM | POA: Diagnosis not present

## 2014-04-21 DIAGNOSIS — Z7982 Long term (current) use of aspirin: Secondary | ICD-10-CM

## 2014-04-21 DIAGNOSIS — I4949 Other premature depolarization: Secondary | ICD-10-CM | POA: Diagnosis not present

## 2014-04-21 DIAGNOSIS — H43399 Other vitreous opacities, unspecified eye: Secondary | ICD-10-CM | POA: Diagnosis present

## 2014-04-21 DIAGNOSIS — I6340 Cerebral infarction due to embolism of unspecified cerebral artery: Secondary | ICD-10-CM

## 2014-04-21 DIAGNOSIS — E118 Type 2 diabetes mellitus with unspecified complications: Secondary | ICD-10-CM

## 2014-04-21 DIAGNOSIS — Z8679 Personal history of other diseases of the circulatory system: Secondary | ICD-10-CM

## 2014-04-21 DIAGNOSIS — E1129 Type 2 diabetes mellitus with other diabetic kidney complication: Secondary | ICD-10-CM | POA: Diagnosis not present

## 2014-04-21 DIAGNOSIS — R9431 Abnormal electrocardiogram [ECG] [EKG]: Secondary | ICD-10-CM | POA: Diagnosis not present

## 2014-04-21 DIAGNOSIS — F411 Generalized anxiety disorder: Secondary | ICD-10-CM

## 2014-04-21 DIAGNOSIS — F329 Major depressive disorder, single episode, unspecified: Secondary | ICD-10-CM | POA: Diagnosis present

## 2014-04-21 DIAGNOSIS — F32A Depression, unspecified: Secondary | ICD-10-CM

## 2014-04-21 DIAGNOSIS — G4733 Obstructive sleep apnea (adult) (pediatric): Secondary | ICD-10-CM | POA: Diagnosis not present

## 2014-04-21 DIAGNOSIS — F40298 Other specified phobia: Secondary | ICD-10-CM | POA: Diagnosis present

## 2014-04-21 DIAGNOSIS — N189 Chronic kidney disease, unspecified: Secondary | ICD-10-CM

## 2014-04-21 DIAGNOSIS — F3289 Other specified depressive episodes: Secondary | ICD-10-CM | POA: Diagnosis present

## 2014-04-21 DIAGNOSIS — Z888 Allergy status to other drugs, medicaments and biological substances status: Secondary | ICD-10-CM

## 2014-04-21 DIAGNOSIS — I2510 Atherosclerotic heart disease of native coronary artery without angina pectoris: Secondary | ICD-10-CM | POA: Diagnosis present

## 2014-04-21 DIAGNOSIS — N183 Chronic kidney disease, stage 3 (moderate): Secondary | ICD-10-CM

## 2014-04-21 DIAGNOSIS — H53469 Homonymous bilateral field defects, unspecified side: Secondary | ICD-10-CM | POA: Diagnosis present

## 2014-04-21 DIAGNOSIS — IMO0002 Reserved for concepts with insufficient information to code with codable children: Secondary | ICD-10-CM | POA: Diagnosis not present

## 2014-04-21 DIAGNOSIS — I493 Ventricular premature depolarization: Secondary | ICD-10-CM

## 2014-04-21 DIAGNOSIS — Z6832 Body mass index (BMI) 32.0-32.9, adult: Secondary | ICD-10-CM

## 2014-04-21 DIAGNOSIS — I6509 Occlusion and stenosis of unspecified vertebral artery: Secondary | ICD-10-CM | POA: Diagnosis not present

## 2014-04-21 DIAGNOSIS — E1110 Type 2 diabetes mellitus with ketoacidosis without coma: Secondary | ICD-10-CM

## 2014-04-21 DIAGNOSIS — Z8673 Personal history of transient ischemic attack (TIA), and cerebral infarction without residual deficits: Secondary | ICD-10-CM | POA: Diagnosis not present

## 2014-04-21 DIAGNOSIS — Z954 Presence of other heart-valve replacement: Secondary | ICD-10-CM

## 2014-04-21 DIAGNOSIS — H53462 Homonymous bilateral field defects, left side: Secondary | ICD-10-CM

## 2014-04-21 DIAGNOSIS — Q211 Atrial septal defect: Secondary | ICD-10-CM | POA: Diagnosis not present

## 2014-04-21 DIAGNOSIS — E0810 Diabetes mellitus due to underlying condition with ketoacidosis without coma: Secondary | ICD-10-CM

## 2014-04-21 DIAGNOSIS — I129 Hypertensive chronic kidney disease with stage 1 through stage 4 chronic kidney disease, or unspecified chronic kidney disease: Secondary | ICD-10-CM | POA: Diagnosis present

## 2014-04-21 DIAGNOSIS — I498 Other specified cardiac arrhythmias: Secondary | ICD-10-CM | POA: Diagnosis present

## 2014-04-21 DIAGNOSIS — Q2111 Secundum atrial septal defect: Secondary | ICD-10-CM | POA: Diagnosis not present

## 2014-04-21 DIAGNOSIS — E785 Hyperlipidemia, unspecified: Secondary | ICD-10-CM | POA: Diagnosis not present

## 2014-04-21 DIAGNOSIS — I5022 Chronic systolic (congestive) heart failure: Secondary | ICD-10-CM | POA: Diagnosis present

## 2014-04-21 DIAGNOSIS — I6350 Cerebral infarction due to unspecified occlusion or stenosis of unspecified cerebral artery: Secondary | ICD-10-CM | POA: Diagnosis not present

## 2014-04-21 DIAGNOSIS — R7301 Impaired fasting glucose: Secondary | ICD-10-CM | POA: Diagnosis not present

## 2014-04-21 HISTORY — DX: Homonymous bilateral field defects, left side: H53.462

## 2014-04-21 HISTORY — DX: Type 2 diabetes mellitus with ketoacidosis without coma: E11.10

## 2014-04-21 LAB — PROTIME-INR
INR: 1.19 (ref 0.00–1.49)
PROTHROMBIN TIME: 15.1 s (ref 11.6–15.2)

## 2014-04-21 LAB — CBC
HCT: 35.5 % — ABNORMAL LOW (ref 39.0–52.0)
HEMATOCRIT: 34.8 % — AB (ref 39.0–52.0)
HEMOGLOBIN: 11.6 g/dL — AB (ref 13.0–17.0)
Hemoglobin: 12.1 g/dL — ABNORMAL LOW (ref 13.0–17.0)
MCH: 24.2 pg — AB (ref 26.0–34.0)
MCH: 24.4 pg — ABNORMAL LOW (ref 26.0–34.0)
MCHC: 34.1 g/dL (ref 30.0–36.0)
MCHC: 33.3 g/dL (ref 30.0–36.0)
MCV: 71.1 fL — ABNORMAL LOW (ref 78.0–100.0)
MCV: 73.3 fL — AB (ref 78.0–100.0)
PLATELETS: 145 10*3/uL — AB (ref 150–400)
Platelets: 140 10*3/uL — ABNORMAL LOW (ref 150–400)
RBC: 4.99 MIL/uL (ref 4.22–5.81)
RBC: 4.75 MIL/uL (ref 4.22–5.81)
RDW: 16.1 % — AB (ref 11.5–15.5)
RDW: 16 % — ABNORMAL HIGH (ref 11.5–15.5)
WBC: 8.2 10*3/uL (ref 4.0–10.5)
WBC: 6.8 10*3/uL (ref 4.0–10.5)

## 2014-04-21 LAB — URINALYSIS, ROUTINE W REFLEX MICROSCOPIC
BILIRUBIN URINE: NEGATIVE
Glucose, UA: >1000 mg/dL — AB
Ketones, ur: NEGATIVE mg/dL
Leukocytes, UA: NEGATIVE
Nitrite: NEGATIVE
Protein, ur: 100 mg/dL — AB
Specific Gravity, Urine: 1.016 (ref 1.005–1.030)
UROBILINOGEN UA: 0.2 mg/dL (ref 0.0–1.0)
pH: 5 (ref 5.0–8.0)

## 2014-04-21 LAB — DIFFERENTIAL
BASOS ABS: 0 10*3/uL (ref 0.0–0.1)
BASOS PCT: 0 % (ref 0–1)
EOS ABS: 0.1 10*3/uL (ref 0.0–0.7)
Eosinophils Relative: 1 % (ref 0–5)
Lymphocytes Relative: 14 % (ref 12–46)
Lymphs Abs: 1.1 10*3/uL (ref 0.7–4.0)
Monocytes Absolute: 0.5 10*3/uL (ref 0.1–1.0)
Monocytes Relative: 6 % (ref 3–12)
NEUTROS ABS: 6.5 10*3/uL (ref 1.7–7.7)
NEUTROS PCT: 79 % — AB (ref 43–77)

## 2014-04-21 LAB — RAPID URINE DRUG SCREEN, HOSP PERFORMED
Amphetamines: NOT DETECTED
BENZODIAZEPINES: NOT DETECTED
Barbiturates: NOT DETECTED
COCAINE: NOT DETECTED
Opiates: NOT DETECTED
TETRAHYDROCANNABINOL: NOT DETECTED

## 2014-04-21 LAB — BASIC METABOLIC PANEL
Anion gap: 13 (ref 5–15)
Anion gap: 10 (ref 5–15)
BUN: 47 mg/dL — AB (ref 6–23)
BUN: 46 mg/dL — ABNORMAL HIGH (ref 6–23)
CHLORIDE: 97 meq/L (ref 96–112)
CO2: 23 mEq/L (ref 19–32)
CO2: 24 mEq/L (ref 19–32)
Calcium: 8.5 mg/dL (ref 8.4–10.5)
Calcium: 8.7 mg/dL (ref 8.4–10.5)
Chloride: 95 mEq/L — ABNORMAL LOW (ref 96–112)
Creatinine, Ser: 2.1 mg/dL — ABNORMAL HIGH (ref 0.50–1.35)
Creatinine, Ser: 2.02 mg/dL — ABNORMAL HIGH (ref 0.50–1.35)
GFR calc Af Amer: 36 mL/min — ABNORMAL LOW (ref >90)
GFR calc Af Amer: 37 mL/min — ABNORMAL LOW (ref >90)
GFR, EST NON AFRICAN AMERICAN: 31 mL/min — AB (ref >90)
GFR, EST NON AFRICAN AMERICAN: 32 mL/min — AB (ref >90)
GLUCOSE: 402 mg/dL — AB (ref 70–99)
GLUCOSE: 338 mg/dL — AB (ref 70–99)
POTASSIUM: 4.8 meq/L (ref 3.7–5.3)
POTASSIUM: 4.5 meq/L (ref 3.7–5.3)
Sodium: 131 mEq/L — ABNORMAL LOW (ref 137–147)
Sodium: 131 mEq/L — ABNORMAL LOW (ref 137–147)

## 2014-04-21 LAB — URINE MICROSCOPIC-ADD ON

## 2014-04-21 LAB — COMPREHENSIVE METABOLIC PANEL
ALBUMIN: 2.9 g/dL — AB (ref 3.5–5.2)
ALK PHOS: 134 U/L — AB (ref 39–117)
ALT: 13 U/L (ref 0–53)
ANION GAP: 15 (ref 5–15)
AST: 13 U/L (ref 0–37)
BUN: 50 mg/dL — ABNORMAL HIGH (ref 6–23)
CO2: 21 mEq/L (ref 19–32)
Calcium: 8.9 mg/dL (ref 8.4–10.5)
Chloride: 91 mEq/L — ABNORMAL LOW (ref 96–112)
Creatinine, Ser: 2.27 mg/dL — ABNORMAL HIGH (ref 0.50–1.35)
GFR calc Af Amer: 32 mL/min — ABNORMAL LOW (ref >90)
GFR calc non Af Amer: 28 mL/min — ABNORMAL LOW (ref >90)
Glucose, Bld: 480 mg/dL — ABNORMAL HIGH (ref 70–99)
Potassium: 5 mEq/L (ref 3.7–5.3)
Sodium: 127 mEq/L — ABNORMAL LOW (ref 137–147)
Total Bilirubin: 0.5 mg/dL (ref 0.3–1.2)
Total Protein: 6.6 g/dL (ref 6.0–8.3)

## 2014-04-21 LAB — MRSA PCR SCREENING: MRSA by PCR: NEGATIVE

## 2014-04-21 LAB — I-STAT TROPONIN, ED: Troponin i, poc: 0 ng/mL (ref 0.00–0.08)

## 2014-04-21 LAB — CBG MONITORING, ED: GLUCOSE-CAPILLARY: 418 mg/dL — AB (ref 70–99)

## 2014-04-21 LAB — ETHANOL: Alcohol, Ethyl (B): <11 mg/dL (ref 0–11)

## 2014-04-21 LAB — APTT: APTT: 24 s (ref 24–37)

## 2014-04-21 LAB — TROPONIN I: Troponin I: <0.3 ng/mL (ref <0.30)

## 2014-04-21 MED ORDER — TAMSULOSIN HCL 0.4 MG PO CAPS
0.4000 mg | ORAL_CAPSULE | Freq: Two times a day (BID) | ORAL | Status: DC
  Administered 2014-04-21 – 2014-04-27 (×10): 0.4 mg via ORAL
  Filled 2014-04-21 (×13): qty 1

## 2014-04-21 MED ORDER — METOPROLOL SUCCINATE ER 100 MG PO TB24
100.0000 mg | ORAL_TABLET | Freq: Every day | ORAL | Status: DC
  Administered 2014-04-24: 100 mg via ORAL
  Filled 2014-04-21 (×3): qty 1

## 2014-04-21 MED ORDER — DEXTROSE 50 % IV SOLN: 25.0000 mL | INTRAVENOUS | Status: DC | PRN

## 2014-04-21 MED ORDER — AMLODIPINE BESYLATE 2.5 MG PO TABS
2.5000 mg | ORAL_TABLET | Freq: Every day | ORAL | Status: DC
  Administered 2014-04-24: 2.5 mg via ORAL
  Filled 2014-04-21 (×3): qty 1

## 2014-04-21 MED ORDER — ATORVASTATIN CALCIUM 10 MG PO TABS
10.0000 mg | ORAL_TABLET | Freq: Every day | ORAL | Status: DC
  Administered 2014-04-21 – 2014-04-26 (×6): 10 mg via ORAL
  Filled 2014-04-21 (×7): qty 1

## 2014-04-21 MED ORDER — VITAMIN B-12 1000 MCG PO TABS
1000.0000 ug | ORAL_TABLET | Freq: Every day | ORAL | Status: DC
  Administered 2014-04-24 – 2014-04-27 (×4): 1000 ug via ORAL
  Filled 2014-04-21 (×6): qty 1

## 2014-04-21 MED ORDER — ENOXAPARIN SODIUM 40 MG/0.4ML ~~LOC~~ SOLN
40.0000 mg | SUBCUTANEOUS | Status: DC
  Administered 2014-04-21 – 2014-04-26 (×6): 40 mg via SUBCUTANEOUS
  Filled 2014-04-21 (×6): qty 0.4

## 2014-04-21 MED ORDER — DEXTROSE-NACL 5-0.45 % IV SOLN
INTRAVENOUS | Status: DC
  Administered 2014-04-22: 01:00:00 via INTRAVENOUS

## 2014-04-21 MED ORDER — SODIUM CHLORIDE 0.9 % IV BOLUS (SEPSIS)
1000.0000 mL | Freq: Once | INTRAVENOUS | Status: AC
  Administered 2014-04-21: 1000 mL via INTRAVENOUS

## 2014-04-21 MED ORDER — SODIUM CHLORIDE 0.9 % IV SOLN
INTRAVENOUS | Status: DC
  Administered 2014-04-21: 20:00:00 via INTRAVENOUS

## 2014-04-21 MED ORDER — SODIUM CHLORIDE 0.9 % IV SOLN
INTRAVENOUS | Status: DC
  Administered 2014-04-21: 3.2 [IU]/h via INTRAVENOUS
  Administered 2014-04-22: 2.8 [IU]/h via INTRAVENOUS
  Administered 2014-04-22: 2.9 [IU]/h via INTRAVENOUS
  Filled 2014-04-21: qty 2.5

## 2014-04-21 MED ORDER — CLONAZEPAM 0.5 MG PO TABS: 0.5000 mg | ORAL_TABLET | Freq: Three times a day (TID) | ORAL | Status: DC | PRN

## 2014-04-21 MED ORDER — FLUTICASONE FUROATE 27.5 MCG/SPRAY NA SUSP: 2.0000 | Freq: Every day | NASAL | Status: DC

## 2014-04-21 MED ORDER — ASPIRIN 325 MG PO TABS
325.0000 mg | ORAL_TABLET | Freq: Every day | ORAL | Status: DC
Start: 2014-04-21 — End: 2014-04-21

## 2014-04-21 MED ORDER — CITALOPRAM HYDROBROMIDE 40 MG PO TABS
40.0000 mg | ORAL_TABLET | Freq: Every day | ORAL | Status: DC
  Filled 2014-04-21 (×2): qty 1

## 2014-04-21 MED ORDER — STROKE: EARLY STAGES OF RECOVERY BOOK
Freq: Once | Status: DC
  Filled 2014-04-21: qty 1

## 2014-04-21 MED ORDER — PANTOPRAZOLE SODIUM 40 MG PO TBEC
80.0000 mg | DELAYED_RELEASE_TABLET | Freq: Every day | ORAL | Status: DC
  Administered 2014-04-22 – 2014-04-27 (×5): 80 mg via ORAL
  Filled 2014-04-21 (×4): qty 2

## 2014-04-21 MED ORDER — LORAZEPAM 2 MG/ML IJ SOLN: 1.0000 mg | Freq: Once | INTRAMUSCULAR | Status: DC

## 2014-04-21 MED ORDER — ASPIRIN 325 MG PO TABS
325.0000 mg | ORAL_TABLET | Freq: Every day | ORAL | Status: DC
  Administered 2014-04-21 – 2014-04-27 (×6): 325 mg via ORAL
  Filled 2014-04-21 (×8): qty 1

## 2014-04-21 MED ORDER — CETYLPYRIDINIUM CHLORIDE 0.05 % MT LIQD
7.0000 mL | Freq: Two times a day (BID) | OROMUCOSAL | Status: DC
  Administered 2014-04-21 – 2014-04-27 (×10): 7 mL via OROMUCOSAL

## 2014-04-21 MED ORDER — DILTIAZEM HCL ER COATED BEADS 240 MG PO CP24
240.0000 mg | ORAL_CAPSULE | Freq: Every day | ORAL | Status: DC
  Filled 2014-04-21: qty 1

## 2014-04-21 MED ORDER — LORAZEPAM 2 MG/ML IJ SOLN
1.0000 mg | Freq: Once | INTRAMUSCULAR | Status: AC
  Administered 2014-04-21: 1 mg via INTRAVENOUS
  Filled 2014-04-21: qty 1

## 2014-04-21 MED ORDER — ASPIRIN 300 MG RE SUPP
300.0000 mg | Freq: Every day | RECTAL | Status: DC
  Filled 2014-04-21 (×3): qty 1

## 2014-04-21 MED ORDER — FLUTICASONE PROPIONATE 50 MCG/ACT NA SUSP
2.0000 | Freq: Every day | NASAL | Status: DC
  Administered 2014-04-22 – 2014-04-27 (×5): 2 via NASAL
  Filled 2014-04-21 (×3): qty 16

## 2014-04-21 NOTE — ED Notes (Signed)
Family walked over to MRI to help keep pt calm through procedure.

## 2014-04-21 NOTE — Telephone Encounter (Signed)
Left message for patient's daughter that the [facility] has written an Rx for xanax, but we also have packets in the office that she can pick up.  I asked that she return my call and let me know if she wanted to get if from the office or go to the pharmacy.

## 2014-04-21 NOTE — ED Notes (Signed)
Pt placed on on monitor upon arrival to room. Pt monitored by 12 lead, blood pressure, and pulse ox.

## 2014-04-21 NOTE — Telephone Encounter (Signed)
Noted that pt is in hospital now. Will take care of him while he is in the hospital. Thanks.   Rosalin Hawking, MD PhD Stroke Neurology 04/21/2014 5:58 PM

## 2014-04-21 NOTE — H&P (Signed)
History and Physical  Barry Haley [id number] DOB: 12-19-1945 DOA: 04/21/2014   PCP: [name], MD   Chief Complaint: Confusion, gait instability  HPI:  68 year old male with a history of hypertension, hyperlipidemia, diabetes mellitus, anxiety, and stroke presents with one-week history of "floaters" in his visual field.  Apparently, the patient went to see his ophthalmologist and was diagnosed with a left hemianopsia. There was concern for stroke. The patient was sent to neurology.  He saw Dr. Erlinda Hong on 8/14 and outpt stroke workup was started, but he has not had an MRI. Unfortunately, the patient also has had increasing confusion that has worsened since 04/16/2014. He was noted to have hemoglobin A1c of 13.7 on 04/16/2014. He was instructed to go to his primary care physician whom he saw on the day of admission. His CBG was over 450 in the office, and he was sent to the emergency department. In addition, the patient has had gait instability with mechanical fall 2 days prior to this admission. There was no syncope. The patient is pleasantly confused presently, and is unable to provide any significant history. However he denies any fevers, chills, chest pain, shortness breath, nausea, vomiting, diarrhea, abdominal pain, dysuria, hematuria. 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 He had a full recovery.   In emergency department, the patient had serum glucose 480 anion gap of 15. He was given 1 L normal saline. MRI was ordered, but he was not able to tolerate it due to 2 phobia and agitation. Urinalysis was negative for pyuria. Urine drug screen and alcohol were negative. EKG shows sinus rhythm with ST depression V1 to V3  Assessment/Plan: DKA -Mild with anion gap of 15 and bicarbonate 21 -Start insulin drip and IV fluids, BMP q 2 hours -Hemoglobin A1c 13.7 as discussed above -Transition to subcutaneous insulin when  gap closes Left  hemianopsia -Neurology has seen the patient -MRI brain -Carotid ultrasound -Echocardiogram -LDL 78 Abnormal EKG -Presently without chest pain -Cycle troponins Hypertension -Continue amlodipine, diltiazem, and metoprolol succinate Acute on Chronic renal failure (CKD2) -due to volume depletion -IVF -baseline creatinine around 1.2 -hold furosemide Depression/anxiety -Continue Celexa and clonazepam Diabetes mellitus type 2, uncontrolled -Discontinue Amaryl and Januvia -He will need to go home on insulin       Past Medical History  Diagnosis Date  . Hypertension   . Hyperlipidemia   . Diabetes   . Cerebral aneurysm, nonruptured   . Epistaxis   . Anxiety state, unspecified   . Unspecified cerebral artery occlusion with cerebral infarction   . Other shock without mention of trauma   . Personal history of other disorder of urinary system   . Personal history of other diseases of digestive system   . Hx of migraines   . Sleep apnea   . Meningitis 2010   Past Surgical History  Procedure Laterality Date  . Appendectomy      child  . 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 Mitral valve reconstruction with cardiopulmonary bypass  01/27/2013   Social History:  reports that he quit smoking about 5 years ago. His smoking use included Cigarettes. He has a 100 pack-year smoking history. He has never used smokeless tobacco. He reports that he drinks alcohol. He reports that he uses illicit drugs.   Family History  Problem Relation Age of Onset  . Heart failure Mother   . Diabetes Mother   . Cervical cancer Mother   .  Alzheimer's disease Father   . Diabetes Father   . Diabetes Brother      Allergies  Allergen Reactions  . Hctz [Hydrochlorothiazide]     Renal insufficiency  . Niaspan [Niacin Er]     itching  . Zolpidem Anxiety    Bad dreams      Prior to Admission medications   Medication Sig Start Date End Date Taking? Authorizing Provider   ALPRAZolam Duanne Moron) 0.5 MG tablet Take 1 tablet (0.5 mg total) by mouth at bedtime as needed for anxiety. 04/20/14   Asencion Partridge Dohmeier, MD  amLODipine (NORVASC) 5 MG tablet Take 2.5 mg by mouth daily.     Historical Provider, MD  aspirin 325 MG tablet Take 1 tablet (325 mg total) by mouth daily. 04/16/14   Rosalin Hawking, MD  atorvastatin (LIPITOR) 10 MG tablet Take 10 mg by mouth at bedtime.    Historical Provider, MD  citalopram (CELEXA) 40 MG tablet Take 1 tablet (40 mg total) by mouth daily. 03/24/14   Philmore Pali, NP  clonazePAM (KLONOPIN) 0.5 MG tablet Take 1 tablet (0.5 mg total) by mouth every 8 (eight) hours as needed. 03/24/14   Philmore Pali, NP  diltiazem (CARDIZEM CD) 240 MG 24 hr capsule Take 240 mg by mouth daily.    Historical Provider, MD  esomeprazole (NEXIUM) 40 MG capsule Take 40 mg by mouth daily.      Historical Provider, MD  fluticasone (VERAMYST) 27.5 MCG/SPRAY nasal spray Place 2 sprays into the nose daily.    Historical Provider, MD  furosemide (LASIX) 40 MG tablet Take 40 mg by mouth daily.    Historical Provider, MD  glimepiride (AMARYL) 1 MG tablet Take 1 mg by mouth daily with breakfast.    Historical Provider, MD  metoprolol succinate (TOPROL-XL) 50 MG 24 hr tablet Take 100 mg by mouth daily. Take with or immediately following a meal.    Historical Provider, MD  sitaGLIPtin (JANUVIA) 50 MG tablet Take 50 mg by mouth daily.    Historical Provider, MD  Tamsulosin HCl (FLOMAX) 0.4 MG CAPS Take 0.4 mg by mouth 2 (two) times daily.     Historical Provider, MD  vitamin B-12 (CYANOCOBALAMIN) 1000 MCG tablet Take 1,000 mcg by mouth daily.      Historical Provider, MD    Review of Systems:  Constitutional:  No weight loss, night sweats, Fevers, chills, fatigue.  Head&Eyes: No headache.  No vision loss.  No eye pain or scotoma ENT:  No Difficulty swallowing,Tooth/dental problems,Sore throat,  No ear ache, post nasal drip,  Cardio-vascular:  No chest pain, Orthopnea, PND, swelling in  lower extremities,  dizziness, palpitations  GI:  No  abdominal pain, nausea, vomiting, diarrhea, loss of appetite, hematochezia, melena, heartburn, indigestion, Resp:  No shortness of breath with exertion or at rest. No cough. No coughing up of blood .No wheezing.No chest wall deformity  Skin:  no rash or lesions.  GU:  no dysuria, change in color of urine, no urgency or frequency. No flank pain.  Musculoskeletal:  No joint pain or swelling. No decreased range of motion. No back pain.  Psych:  No change in mood or affect.  Neurologic: No headache, no dysesthesia, no focal weakness No syncope  Physical Exam: Filed Vitals:   04/21/14 1430 04/21/14 1615 04/21/14 1628 04/21/14 1645  BP: 107/45 142/54 142/54 145/67  Pulse: 48 50 50 50  Temp:      TempSrc:      Resp: 15 19 21  15  SpO2: 97% 100% 95% 98%   General:  A&O x 2, NAD, nontoxic, pleasant/cooperative Head/Eye: No conjunctival hemorrhage, no icterus, [place]/AT, No nystagmus ENT:  No icterus,  No thrush, good dentition, no pharyngeal exudate Neck:  No masses, no lymphadenpathy, no bruits CV:  RRR, no rub, no gallop, no S3 Lung:  CTAB, good air movement, no wheeze, no rhonchi Abdomen: soft/NT, +BS, nondistended, no peritoneal signs Ext: No cyanosis, No rashes, No petechiae, No lymphangitis, No edema Neuro: CNII-XII intact, strength 4/5 in bilateral upper and lower extremities, no dysmetria  Labs on Admission:  Basic Metabolic Panel:  Recent Labs Lab 04/21/14 1332  NA 127*  K 5.0  CL 91*  CO2 21  GLUCOSE 480*  BUN 50*  CREATININE 2.27*  CALCIUM 8.9   Liver Function Tests:  Recent Labs Lab 04/21/14 1332  AST 13  ALT 13  ALKPHOS 134*  BILITOT 0.5  PROT 6.6  ALBUMIN 2.9*   No results found for this basename: LIPASE, AMYLASE,  in the last 168 hours No results found for this basename: AMMONIA,  in the last 168 hours CBC:  Recent Labs Lab 04/21/14 1332  WBC 8.2  NEUTROABS 6.5  HGB 12.1*  HCT 35.5*  MCV  71.1*  PLT 145*   Cardiac Enzymes: No results found for this basename: CKTOTAL, CKMB, CKMBINDEX, TROPONINI,  in the last 168 hours BNP: No components found with this basename: POCBNP,  CBG: No results found for this basename: GLUCAP,  in the last 168 hours  Radiological Exams on Admission: No results found.  EKG: Independently reviewed. Sinus rhythm, ST depression in V1 to V3    Time spent:60 minutes Code Status:   FULL Family Communication:   Family at bedside   Jhordyn Hoopingarner, [place], DO  [facility] Pager [phone]  If 7PM-7AM, please contact night-coverage [email or web address] Password [facility] 04/21/2014, 5:38 PM

## 2014-04-21 NOTE — ED Notes (Signed)
Pt placed on monitor upon arrival to room. Pt monitored by blood pressure, pulse ox, and 12 lead.

## 2014-04-21 NOTE — Progress Notes (Signed)
Admitted from ED without complaints.  Patient oriented to person and time.  Daughter at bedside.

## 2014-04-21 NOTE — ED Provider Notes (Signed)
CSN: [id number]     Arrival date & time 04/21/14  1256 History   First MD Initiated Contact with Patient 04/21/14 1314     Chief Complaint  Patient presents with  . 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 Patient reports new floaters and shapes in vision. Patient was seen by his ophthalmologist and found to have left hemianopsia. Patient subsequently followed up with neurologist Dr. Erlinda Hong. Outpatient work-up planned. Patient had outpatient EEG performed yesterday. He was scheduled for TEE tomorrow to rule-out endocarditis. He is scheduled for an MRI in the next week. Patient presents to the emergency department today with hyperglycemia, orthostasis, new confusion and worsening gait and problems with balance. This was noted at PCP appointment this morning. Patient denies unilateral weakness or headache.   The history is provided by the patient and medical records.    Past Medical History  Diagnosis Date  . Hypertension   . Hyperlipidemia   . Diabetes   . Cerebral aneurysm, nonruptured   . Epistaxis   . Anxiety state, unspecified   . Unspecified cerebral artery occlusion with cerebral infarction   . Other shock without mention of trauma   . Personal history of other disorder of urinary system   . Personal history of other diseases of digestive system   . Hx of migraines   . Sleep apnea   . Meningitis 2010   Past Surgical History  Procedure Laterality Date  . Appendectomy      child  . 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 Mitral valve reconstruction with cardiopulmonary bypass  01/27/2013   Family History  Problem Relation Age of Onset  . Heart failure Mother   . Diabetes Mother   . Cervical cancer Mother    . Alzheimer's disease Father   . Diabetes Father   . Diabetes Brother    History  Substance Use Topics  . Smoking status: Former Smoker -- 2.50 packs/day for 40 years    Types: Cigarettes    Quit date: 10/04/2008  . Smokeless tobacco: Never Used  . Alcohol Use: Yes     Comment: quit: 10/04/2008- rarely now    Review of Systems  Constitutional: Negative for fatigue.  HENT: Negative for tinnitus.   Eyes: Positive for visual disturbance. Negative for photophobia and pain.  Respiratory: Negative for shortness of breath.   Cardiovascular: Negative for chest pain.  Gastrointestinal: Negative for nausea and vomiting.  Musculoskeletal: Positive for gait problem. Negative for back pain and neck pain.  Skin: Negative for wound.  Neurological: Negative for dizziness, weakness, light-headedness, numbness and headaches.  Psychiatric/Behavioral: Positive for confusion. Negative for decreased concentration.    Allergies  Hctz; Niaspan; and Zolpidem  Home Medications   Prior to Admission medications   Medication Sig Start Date End Date Taking? Authorizing Provider  ALPRAZolam Duanne Moron) 0.5 MG tablet Take 1 tablet (0.5 mg total) by mouth at bedtime as needed for anxiety. 04/20/14   Asencion Partridge Dohmeier, MD  amLODipine (NORVASC) 5 MG tablet Take 2.5 mg by mouth daily.     Historical Provider, MD  aspirin 325 MG tablet Take 1 tablet (325 mg total) by mouth daily. 04/16/14   Rosalin Hawking, MD  atorvastatin (LIPITOR) 10 MG tablet Take 10 mg by mouth at bedtime.    Historical Provider, MD  citalopram (CELEXA) 40 MG  tablet Take 1 tablet (40 mg total) by mouth daily. 03/24/14   Philmore Pali, NP  clonazePAM (KLONOPIN) 0.5 MG tablet Take 1 tablet (0.5 mg total) by mouth every 8 (eight) hours as needed. 03/24/14   Philmore Pali, NP  diltiazem (CARDIZEM CD) 240 MG 24 hr capsule Take 240 mg by mouth daily.    Historical Provider, MD  esomeprazole (NEXIUM) 40 MG capsule Take 40 mg by mouth daily.      Historical Provider,  MD  fluticasone (VERAMYST) 27.5 MCG/SPRAY nasal spray Place 2 sprays into the nose daily.    Historical Provider, MD  furosemide (LASIX) 40 MG tablet Take 40 mg by mouth daily.    Historical Provider, MD  glimepiride (AMARYL) 1 MG tablet Take 1 mg by mouth daily with breakfast.    Historical Provider, MD  metoprolol succinate (TOPROL-XL) 50 MG 24 hr tablet Take 100 mg by mouth daily. Take with or immediately following a meal.    Historical Provider, MD  sitaGLIPtin (JANUVIA) 50 MG tablet Take 50 mg by mouth daily.    Historical Provider, MD  Tamsulosin HCl (FLOMAX) 0.4 MG CAPS Take 0.4 mg by mouth 2 (two) times daily.     Historical Provider, MD  vitamin B-12 (CYANOCOBALAMIN) 1000 MCG tablet Take 1,000 mcg by mouth daily.      Historical Provider, MD   BP 139/54  Pulse 49  Temp(Src) 98 F (36.7 C) (Oral)  Resp 18  SpO2 95%  Physical Exam  Nursing note and vitals reviewed. Constitutional: He is oriented to person, place, and time. He appears well-developed and well-nourished.  HENT:  Head: Normocephalic and atraumatic.  Right Ear: Tympanic membrane, external ear and ear canal normal.  Left Ear: Tympanic membrane, external ear and ear canal normal.  Nose: Nose normal.  Mouth/Throat: Uvula is midline, oropharynx is clear and moist and mucous membranes are normal.  Eyes: Conjunctivae, EOM and lids are normal. Pupils are equal, round, and reactive to light.  Neck: Normal range of motion. Neck supple.  Cardiovascular: Normal rate and regular rhythm.   Pulmonary/Chest: Effort normal and breath sounds normal.  Abdominal: Soft. There is no tenderness.  Musculoskeletal: Normal range of motion.       Cervical back: He exhibits normal range of motion, no tenderness and no bony tenderness.  Neurological: He is alert and oriented to person, place, and time. He has normal strength and normal reflexes. No cranial nerve deficit or sensory deficit. He exhibits normal muscle tone. He displays a negative  Romberg sign. Coordination and gait normal. GCS eye subscore is 4. GCS verbal subscore is 5. GCS motor subscore is 6.  Some difficulty tracking finger  Skin: Skin is warm and dry.  Psychiatric: He has a normal mood and affect.    ED Course  Procedures (including critical care time) Labs Review Labs Reviewed  CBC - Abnormal; Notable for the following:    Hemoglobin 12.1 (*)    HCT 35.5 (*)    MCV 71.1 (*)    MCH 24.2 (*)    RDW 16.1 (*)    Platelets 145 (*)    All other components within normal limits  DIFFERENTIAL - Abnormal; Notable for the following:    Neutrophils Relative % 79 (*)    All other components within normal limits  COMPREHENSIVE METABOLIC PANEL - Abnormal; Notable for the following:    Sodium 127 (*)    Chloride 91 (*)    Glucose, Bld 480 (*)  BUN 50 (*)    Creatinine, Ser 2.27 (*)    Albumin 2.9 (*)    Alkaline Phosphatase 134 (*)    GFR calc non Af Amer 28 (*)    GFR calc Af Amer 32 (*)    All other components within normal limits  URINALYSIS, ROUTINE W REFLEX MICROSCOPIC - Abnormal; Notable for the following:    Glucose, UA >1000 (*)    Hgb urine dipstick LARGE (*)    Protein, ur 100 (*)    All other components within normal limits  ETHANOL  PROTIME-INR  APTT  URINE RAPID DRUG SCREEN (HOSP PERFORMED)  URINE MICROSCOPIC-ADD ON  I-STAT TROPOININ, ED  CBG MONITORING, ED    Imaging Review No results found.   EKG Interpretation None      1:16 PM Patient seen and examined. Work-up initiated. Medications ordered.   Vital signs reviewed and are as follows: BP 139/54  Pulse 49  Temp(Src) 98 F (36.7 C) (Oral)  Resp 18  SpO2 95%  4:35 PM Patient discussed with Dr. Jeanell Sparrow previously. Will need admit for AKI, hyperglycemia. Patient was unable to tolerate MRI from ED.    Date: 04/21/2014  Rate: 50  Rhythm: normal sinus rhythm  QRS Axis: normal  Intervals: normal  ST/T Wave abnormalities: t-wave inversions  Conduction Disutrbances:none   Narrative Interpretation:   Old EKG Reviewed: changes noted  4:59 PM Spoke with Dr. Nicole Kindred who will consult.   Spoke with Hospitalist who will admit.     MDM   Final diagnoses:  Cerebral infarction due to unspecified mechanism  Acute kidney injury  Hyperglycemia without ketosis   Admit.     Carlisle Cater, PA-C 04/21/14 1700

## 2014-04-21 NOTE — Progress Notes (Signed)
Pt was brought to MRI but pt said he had not been given anything and didn't want to go in scanner.  I called ER, and yes they had given him meds, but he came in confused.  Offered to have daughter come and be with patient.  Which she did come over and stayed in the room with patient while scans were attempted but pt still would not cooperate for exam.  I called ER back and they said the nurse who gave him the meds was no longer there and the current nurse didn't know enough about him to feel safe giving him anything and I was advised to send him back to the ER, which was done.  Daughter says when he was scheduled as an OP 4 days ago she didn't bring him in because she knew he would need IV conscious sedation for exam, not just PO meds.

## 2014-04-21 NOTE — ED Notes (Signed)
2 weeks ago, reported floaters, lights, shapes in vision. Saw neurologist and was told he prob had another TIA. Was seen at PCP and gait was noticed to be worse than normal. Was orthostatic and CBG 558. After 500 cc of NS CBG 468 per EMS. Lung sounds clear. Initially brady on scene but came up with fluid. A&O; fasted overnight for PCP bloodwork.

## 2014-04-21 NOTE — ED Notes (Signed)
Family reports pt c/o shadows in his left field of vision for about a week and seemingly paranoid about them. Pt is reported to have PTSD from [place].

## 2014-04-21 NOTE — ED Notes (Signed)
Attempted report x1. 

## 2014-04-21 NOTE — Consult Note (Signed)
Referring Physician: Dr. Jeanell Sparrow    Chief Complaint: Visual changes and confusion as well as unstable gait.  HPI: Barry Haley is an 68 y.o. male history hypertension, hyperlipidemia, diabetes mellitus and hemorrhagic stroke with aneurysm rupture, presenting with visual changes which started 10 days ago, as well as progressively worsening gait and increasing confusion. Patient was last known well 04/11/2014. He was subsequently seen by ophthalmology and was found to have left visual field defect and likely right occipital stroke. Patient stroke workup is in progress. Patient was scheduled for an MRI study, which was attempted today but unsuccessful because of apparent claustrophobia. CT scan of the head his been ordered and is pending. TEE was also ordered, as the patient has a history of valve replacement. NIH stroke score was 6.  LSN: 04/11/2014 tPA Given: No: Beyond time under for treatment consideration MRankin: 3  Past Medical History  Diagnosis Date  . Hypertension   . Hyperlipidemia   . Diabetes   . Cerebral aneurysm, nonruptured   . Epistaxis   . Anxiety state, unspecified   . Unspecified cerebral artery occlusion with cerebral infarction   . Other shock without mention of trauma   . Personal history of other disorder of urinary system   . Personal history of other diseases of digestive system   . Hx of migraines   . Sleep apnea   . Meningitis 2010    Family History  Problem Relation Age of Onset  . Heart failure Mother   . Diabetes Mother   . Cervical cancer Mother   . Alzheimer's disease Father   . Diabetes Father   . Diabetes Brother      Medications: I have reviewed the patient's current medications.  ROS: History obtained from spouse and chart review  General ROS: negative for - chills, fatigue, fever, night sweats, weight gain or weight loss Psychological ROS: As noted in history of present illness Ophthalmic ROS: As noted in history of present illness ENT  ROS: negative for - epistaxis, nasal discharge, oral lesions, sore throat, tinnitus or vertigo Allergy and Immunology ROS: negative for - hives or itchy/watery eyes Hematological and Lymphatic ROS: negative for - bleeding problems, bruising or swollen lymph nodes Endocrine ROS: negative for - galactorrhea, hair pattern changes, polydipsia/polyuria or temperature intolerance Respiratory ROS: negative for - cough, hemoptysis, shortness of breath or wheezing Cardiovascular ROS: negative for - chest pain, dyspnea on exertion, edema or irregular heartbeat Gastrointestinal ROS: negative for - abdominal pain, diarrhea, hematemesis, nausea/vomiting or stool incontinence Genito-Urinary ROS: negative for - dysuria, hematuria, incontinence or urinary frequency/urgency Musculoskeletal ROS: negative for - joint swelling or muscular weakness Neurological ROS: as noted in HPI Dermatological ROS: negative for rash and skin lesion changes  Physical Examination: Blood pressure 145/67, pulse 50, temperature 98.2 F (36.8 C), temperature source Oral, resp. rate 15, SpO2 98.00%.  Neurologic Examination: Mental Status: Alert, oriented to correct month but disoriented to correct age; confused and tended to confabulate.  Speech slightly slurred without evidence of aphasia. Able to follow commands without difficulty. Cranial Nerves: II-dense left homonymous hemianopsia. III/IV/VI-Pupils were equal and reacted. Extraocular movements were full and conjugate.    V/VII-mild left facial numbness to tactile sensation; no facial weakness. VIII-normal. X-mild dysarthria; symmetrical palatal movement. Motor: 5/5 bilaterally with normal tone and bulk; diffuse intention tremor noted, as well as mild asterixis involving upper extremities.. Sensory: Reduced perception of tactile sensation over left extremities compared to right extremities. Deep Tendon Reflexes: Trace to 1+ and symmetric. Plantars:  Extensor  bilaterally Cerebellar: Normal finger-to-nose testing, except for slight tremor.  No results found.  Assessment: 68 y.o. male with multiple risk factors for stroke as well as history of previous hemorrhagic stroke for ruptured aneurysm, presenting with probable subacute right occipital infarction with left homonymous hemianopsia, as well as increasing confusion and gait instability.  Stroke Risk Factors - diabetes mellitus, hyperlipidemia and hypertension  Plan: 1. HgbA1c, fasting lipid panel 2. MRI, MRA  of the brain without contrast 3. PT consult, OT consult, Speech consult 4. TEE as planned 5. Carotid dopplers, or CT angiogram with contrast of the head and neck, if MRI studies could not be obtained 6. Prophylactic therapy-Antiplatelet med: Aspirin  7. Risk factor modification 8. Telemetry monitoring   C.R. Nicole Kindred, MD [facility] [phone]  04/21/2014, 5:16 PM

## 2014-04-21 NOTE — ED Notes (Signed)
Pt to MRI without distress.

## 2014-04-21 NOTE — Telephone Encounter (Signed)
Dr. Lin Landsman called re: pt seen at [facility].  Taking to ED by EMS (MS if they will).  Pt hypotensive, not ambulating, ? Sepsis, hyperglycemic.  Wanted Dr. Erlinda Hong and Dr. Leonie Man to be made aware.

## 2014-04-22 ENCOUNTER — Ambulatory Visit (HOSPITAL_COMMUNITY): Admission: RE | Admit: 2014-04-22 | Payer: Medicare Other | Source: Ambulatory Visit | Admitting: Cardiovascular Disease

## 2014-04-22 ENCOUNTER — Inpatient Hospital Stay (HOSPITAL_COMMUNITY): Payer: Medicare Other

## 2014-04-22 ENCOUNTER — Encounter (HOSPITAL_COMMUNITY)
Admission: EM | Disposition: A | Discharge status: Skilled Nursing Facility | Payer: Medicare Other | Source: Home / Self Care | Attending: Internal Medicine

## 2014-04-22 ENCOUNTER — Telehealth: Payer: Self-pay | Admitting: Cardiovascular Disease

## 2014-04-22 DIAGNOSIS — IMO0002 Reserved for concepts with insufficient information to code with codable children: Secondary | ICD-10-CM

## 2014-04-22 DIAGNOSIS — H53469 Homonymous bilateral field defects, unspecified side: Secondary | ICD-10-CM

## 2014-04-22 DIAGNOSIS — N179 Acute kidney failure, unspecified: Secondary | ICD-10-CM

## 2014-04-22 DIAGNOSIS — Q2111 Secundum atrial septal defect: Secondary | ICD-10-CM

## 2014-04-22 DIAGNOSIS — I6350 Cerebral infarction due to unspecified occlusion or stenosis of unspecified cerebral artery: Secondary | ICD-10-CM

## 2014-04-22 DIAGNOSIS — R9431 Abnormal electrocardiogram [ECG] [EKG]: Secondary | ICD-10-CM

## 2014-04-22 DIAGNOSIS — Q211 Atrial septal defect: Secondary | ICD-10-CM

## 2014-04-22 DIAGNOSIS — E1310 Other specified diabetes mellitus with ketoacidosis without coma: Principal | ICD-10-CM

## 2014-04-22 DIAGNOSIS — G4733 Obstructive sleep apnea (adult) (pediatric): Secondary | ICD-10-CM

## 2014-04-22 DIAGNOSIS — I4949 Other premature depolarization: Secondary | ICD-10-CM

## 2014-04-22 DIAGNOSIS — I671 Cerebral aneurysm, nonruptured: Secondary | ICD-10-CM

## 2014-04-22 DIAGNOSIS — I10 Essential (primary) hypertension: Secondary | ICD-10-CM

## 2014-04-22 DIAGNOSIS — N189 Chronic kidney disease, unspecified: Secondary | ICD-10-CM

## 2014-04-22 DIAGNOSIS — E785 Hyperlipidemia, unspecified: Secondary | ICD-10-CM

## 2014-04-22 DIAGNOSIS — E1165 Type 2 diabetes mellitus with hyperglycemia: Secondary | ICD-10-CM

## 2014-04-22 DIAGNOSIS — Z9889 Other specified postprocedural states: Secondary | ICD-10-CM

## 2014-04-22 DIAGNOSIS — E118 Type 2 diabetes mellitus with unspecified complications: Secondary | ICD-10-CM

## 2014-04-22 HISTORY — PX: TEE WITHOUT CARDIOVERSION: SHX5443

## 2014-04-22 LAB — GLUCOSE, CAPILLARY
GLUCOSE-CAPILLARY: 379 mg/dL — AB (ref 70–99)
GLUCOSE-CAPILLARY: 163 mg/dL — AB (ref 70–99)
GLUCOSE-CAPILLARY: 174 mg/dL — AB (ref 70–99)
GLUCOSE-CAPILLARY: 153 mg/dL — AB (ref 70–99)
GLUCOSE-CAPILLARY: 166 mg/dL — AB (ref 70–99)
GLUCOSE-CAPILLARY: 155 mg/dL — AB (ref 70–99)
Glucose-Capillary: 175 mg/dL — ABNORMAL HIGH (ref 70–99)
Glucose-Capillary: 288 mg/dL — ABNORMAL HIGH (ref 70–99)
Glucose-Capillary: 304 mg/dL — ABNORMAL HIGH (ref 70–99)
Glucose-Capillary: 374 mg/dL — ABNORMAL HIGH (ref 70–99)
Glucose-Capillary: 101 mg/dL — ABNORMAL HIGH (ref 70–99)
Glucose-Capillary: 128 mg/dL — ABNORMAL HIGH (ref 70–99)
Glucose-Capillary: 177 mg/dL — ABNORMAL HIGH (ref 70–99)
Glucose-Capillary: 188 mg/dL — ABNORMAL HIGH (ref 70–99)
Glucose-Capillary: 157 mg/dL — ABNORMAL HIGH (ref 70–99)
Glucose-Capillary: 130 mg/dL — ABNORMAL HIGH (ref 70–99)
Glucose-Capillary: 169 mg/dL — ABNORMAL HIGH (ref 70–99)

## 2014-04-22 LAB — TROPONIN I
Troponin I: <0.3 ng/mL (ref <0.30)
Troponin I: <0.3 ng/mL (ref <0.30)

## 2014-04-22 LAB — BASIC METABOLIC PANEL
ANION GAP: 11 (ref 5–15)
Anion gap: 11 (ref 5–15)
BUN: 41 mg/dL — AB (ref 6–23)
BUN: 44 mg/dL — AB (ref 6–23)
CHLORIDE: 104 meq/L (ref 96–112)
CHLORIDE: 100 meq/L (ref 96–112)
CO2: 22 mEq/L (ref 19–32)
CO2: 22 meq/L (ref 19–32)
CREATININE: 1.83 mg/dL — AB (ref 0.50–1.35)
Calcium: 8.7 mg/dL (ref 8.4–10.5)
Calcium: 8.6 mg/dL (ref 8.4–10.5)
Creatinine, Ser: 2.04 mg/dL — ABNORMAL HIGH (ref 0.50–1.35)
GFR calc Af Amer: 37 mL/min — ABNORMAL LOW (ref >90)
GFR calc Af Amer: 42 mL/min — ABNORMAL LOW (ref >90)
GFR calc non Af Amer: 32 mL/min — ABNORMAL LOW (ref >90)
GFR calc non Af Amer: 36 mL/min — ABNORMAL LOW (ref >90)
Glucose, Bld: 161 mg/dL — ABNORMAL HIGH (ref 70–99)
Glucose, Bld: 161 mg/dL — ABNORMAL HIGH (ref 70–99)
POTASSIUM: 3.8 meq/L (ref 3.7–5.3)
Potassium: 4.4 mEq/L (ref 3.7–5.3)
Sodium: 133 mEq/L — ABNORMAL LOW (ref 137–147)
Sodium: 137 mEq/L (ref 137–147)

## 2014-04-22 LAB — CULTURE, BLOOD (SINGLE)

## 2014-04-22 SURGERY — ECHOCARDIOGRAM, TRANSESOPHAGEAL
Anesthesia: Moderate Sedation

## 2014-04-22 MED ORDER — INSULIN ASPART 100 UNIT/ML ~~LOC~~ SOLN
0.0000 [IU] | SUBCUTANEOUS | Status: DC
  Administered 2014-04-22: 3 [IU] via SUBCUTANEOUS
  Administered 2014-04-22: 2 [IU] via SUBCUTANEOUS
  Administered 2014-04-22 – 2014-04-24 (×5): 3 [IU] via SUBCUTANEOUS
  Administered 2014-04-24: 2 [IU] via SUBCUTANEOUS
  Administered 2014-04-24: 3 [IU] via SUBCUTANEOUS
  Administered 2014-04-24: 5 [IU] via SUBCUTANEOUS

## 2014-04-22 MED ORDER — MIDAZOLAM HCL 10 MG/2ML IJ SOLN
INTRAMUSCULAR | Status: DC | PRN
  Administered 2014-04-22: 2 mg via INTRAVENOUS
  Administered 2014-04-22: 1 mg via INTRAVENOUS
  Administered 2014-04-22: 2 mg via INTRAVENOUS

## 2014-04-22 MED ORDER — LORAZEPAM 2 MG/ML IJ SOLN: 2.0000 mg | Freq: Once | INTRAMUSCULAR | Status: DC

## 2014-04-22 MED ORDER — BUTAMBEN-TETRACAINE-BENZOCAINE 2-2-14 % EX AERO
INHALATION_SPRAY | CUTANEOUS | Status: DC | PRN
  Administered 2014-04-22: 2 via TOPICAL

## 2014-04-22 MED ORDER — SODIUM CHLORIDE 0.9 % IV SOLN
INTRAVENOUS | Status: DC
  Administered 2014-04-22: 10:00:00 via INTRAVENOUS

## 2014-04-22 MED ORDER — INSULIN GLARGINE 100 UNIT/ML ~~LOC~~ SOLN
8.0000 [IU] | Freq: Every day | SUBCUTANEOUS | Status: DC
  Administered 2014-04-22 – 2014-04-26 (×4): 8 [IU] via SUBCUTANEOUS
  Filled 2014-04-22 (×5): qty 0.08

## 2014-04-22 MED ORDER — FENTANYL CITRATE 0.05 MG/ML IJ SOLN
INTRAMUSCULAR | Status: DC | PRN
  Administered 2014-04-22: 25 ug via INTRAVENOUS

## 2014-04-22 MED ORDER — LORAZEPAM 2 MG/ML IJ SOLN
2.0000 mg | Freq: Once | INTRAMUSCULAR | Status: AC
  Administered 2014-04-22: 2 mg via INTRAVENOUS
  Filled 2014-04-22: qty 1

## 2014-04-22 NOTE — Progress Notes (Signed)
OT Cancellation Note  Patient Details Name: Barry Haley MRN: [id number] DOB: 1946/01/29   Cancelled Treatment:    Reason Eval/Treat Not Completed: Fatigue/lethargy limiting ability to participate PT remains very lethargic after TEE today. Will assess tomorrow. [place], OTR/L  [phone] 04/22/2014 04/22/2014, 3:29 PM

## 2014-04-22 NOTE — Evaluation (Signed)
Physical Therapy Evaluation Patient Details Name: Penelope Raza MRN: [id number] DOB: 02/10/1946 Today's Date: 04/22/2014   History of Present Illness  68 y.o. male history hypertension, hyperlipidemia, diabetes mellitus and hemorrhagic stroke with aneurysm rupture, presenting with visual changes which started 10 days ago, as well as progressively worsening gait and increasing confusion. Patient was last known well 04/11/2014. He was subsequently seen by ophthalmology and was found to have left visual field defect and likely right occipital stroke. Patient stroke workup is in progress. NIH score 06 upon admission. Awaiting MRI.   Clinical Impression  Pt adm due to above. Presents with decreased independence with functional mobility secondary to deficits indicated below. Pt to benefit from skilled acute PT to address deficits and maximize functional mobility. Evaluation limited to SPT bed <> chair only due to decreased arousal state of pt. Unable to fully assess visual deficits due to lethargy. Will cont to assess pt need for AD when it is safe to progress mobility. Wife present during session. Discussed D/C recommendations and wife very supportive. Pt to be great candidate for CIR. PTA pt fully independent with ADLs and mobility.     Follow Up Recommendations CIR    Equipment Recommendations  Other (comment) (TBD when mobility is progressed)    Recommendations for Other Services Rehab consult;OT consult;Speech consult     Precautions / Restrictions Precautions Precautions: Fall Precaution Comments: wife reports recent falls at home  Restrictions Weight Bearing Restrictions: No      Mobility  Bed Mobility Overal bed mobility: Needs Assistance Bed Mobility: Supine to Sit     Supine to sit: Mod assist;HOB elevated     General bed mobility comments: pt with difficulty following commands; required mod (A) to advanec LEs off EOB and intitiate elevation of trunk to sitting position; pt  more awake with sitting EOB but continued to be inconsistent with responding to questions and following commands  Transfers Overall transfer level: Needs assistance Equipment used: 2 person hand held assist Transfers: Sit to/from Stand;Stand Pivot Transfers Sit to Stand: +2 physical assistance;+2 safety/equipment;Min assist Stand pivot transfers: +2 safety/equipment;Mod assist       General transfer comment: pt with intermittent buckling of LEs with transfers; 2 person (A) due to lethargy; pt was able to follow commands to perform pivotal steps but did not follow commands for safe descent to chair; unsafe to progress mobility at this time   Ambulation/Gait             General Gait Details: unable to at this time due to lethargy  Stairs            Wheelchair Mobility    Modified Rankin (Stroke Patients Only)       Balance Overall balance assessment: History of Falls;Needs assistance Sitting-balance support: Feet supported;No upper extremity supported Sitting balance-Leahy Scale: Fair Sitting balance - Comments: sat EOB without UE support and responded to questions >75% of time (not all responses appropriate)    Standing balance support: During functional activity;Bilateral upper extremity supported Standing balance-Leahy Scale: Poor Standing balance comment: 2 person (A) for safety and to balance                              Pertinent Vitals/Pain Pain Assessment: No/denies pain    Home Living Family/patient expects to be discharged to:: Private residence Living Arrangements: Spouse/significant other Available Help at Discharge: Family;Available 24 hours/day Type of Home: House Home Access: Stairs to enter  Entrance Stairs-Rails: Left;Can reach Advertising account executive of Steps: 5 Home Layout: One level Home Equipment: None Additional Comments: has walk in shower    Prior Function Level of Independence: Independent          Comments: pt drives      Hand Dominance   Dominant Hand: Right    Extremity/Trunk Assessment   Upper Extremity Assessment: Defer to OT evaluation           Lower Extremity Assessment: Difficult to assess due to impaired cognition;Generalized weakness      Cervical / Trunk Assessment: Normal  Communication   Communication: Expressive difficulties  Cognition Arousal/Alertness: Lethargic;Suspect due to medications Behavior During Therapy: Restless Overall Cognitive Status: Difficult to assess Area of Impairment: Orientation;Attention;Memory;Following commands;Safety/judgement;Awareness;Problem solving Orientation Level: Disoriented to;Time;Situation Current Attention Level: Focused Memory: Decreased short-term memory Following Commands: Follows one step commands inconsistently;Follows one step commands with increased time Safety/Judgement: Decreased awareness of deficits;Decreased awareness of safety Awareness: Intellectual Problem Solving: Slow processing;Decreased initiation;Difficulty sequencing;Requires verbal cues;Requires tactile cues General Comments: wife reports pt has been having decr STM for sometime; responds to questions with tactile cues and incr time; no always accurate statements; unable to provide current year, president or situation     General Comments General comments (skin integrity, edema, etc.): wife present during evaluation to answer questions regarding PLOF; discussed PT goals and recommendations     Exercises        Assessment/Plan    PT Assessment Patient needs continued PT services  PT Diagnosis Difficulty walking;Generalized weakness;Altered mental status   PT Problem List Decreased strength;Decreased activity tolerance;Decreased balance;Decreased mobility;Decreased cognition;Decreased knowledge of use of DME;Decreased safety awareness  PT Treatment Interventions DME instruction;Gait training;Stair training;Functional mobility  training;Therapeutic activities;Therapeutic exercise;Neuromuscular re-education;Balance training;Cognitive remediation;Patient/family education   PT Goals (Current goals can be found in the Care Plan section) Acute Rehab PT Goals Patient Stated Goal: wife's goal is for pt to be back to walking  PT Goal Formulation: With patient/family Time For Goal Achievement: 04/29/14 Potential to Achieve Goals: Good    Frequency Min 4X/week   Barriers to discharge        Co-evaluation               End of Session Equipment Utilized During Treatment: Gait belt;Oxygen Activity Tolerance: Patient limited by lethargy Patient left: in chair;with call bell/phone within reach;with family/visitor present Nurse Communication: Mobility status;Precautions         Time: [id number] PT Time Calculation (min): 17 min   Charges:   PT Evaluation $Initial PT Evaluation Tier I: 1 Procedure PT Treatments $Therapeutic Activity: 8-22 mins   PT G CodesGustavus Bryant, [place]  [phone] 04/22/2014, 3:43 PM

## 2014-04-22 NOTE — Progress Notes (Signed)
SLP Cancellation Note  Patient Details Name: Barry Haley MRN: [id number] DOB: 1945-11-21   Cancelled treatment:       Reason Eval/Treat Not Completed: Fatigue/lethargy limiting ability to participate, SLP will follow up in the am.     Eden Emms 04/22/2014, 3:54 PM

## 2014-04-22 NOTE — Progress Notes (Signed)
PT Cancellation Note  Patient Details Name: Barry Haley MRN: [id number] DOB: 01/27/1946   Cancelled Treatment:    Reason Eval/Treat Not Completed: Patient at procedure or test/unavailable. Pt off the floor at TEE; will re-attempt to see pt later this afternoon as time permits.    Elie Confer Heron, [place] 04/22/2014, 11:27 AM

## 2014-04-22 NOTE — Progress Notes (Signed)
*  PRELIMINARY RESULTS* Vascular Ultrasound Carotid Duplex (Doppler) has been completed.   Study was technically difficult due to poor patient cooperation. Findings suggest 1-39% internal carotid artery stenosis bilaterally. Vertebral arteries are patent with antegrade flow.  04/22/2014  Estella Husk, RVT

## 2014-04-22 NOTE — Telephone Encounter (Signed)
New message     Pt was supposed to have a TEE today by Dr Acie Fredrickson.  He was admitted [facility] yesterday by his PCP.  Daughter wanted someone to know.

## 2014-04-22 NOTE — Progress Notes (Addendum)
STROKE TEAM PROGRESS NOTE   HISTORY Barry Haley is an 68 y.o. male history hypertension, hyperlipidemia, diabetes mellitus and hemorrhagic stroke with aneurysm rupture, presenting with visual changes which started 10 days ago, as well as progressively worsening gait and increasing confusion. Patient was last known well 04/11/2014. He was subsequently seen by ophthalmology and was found to have left visual field defect and likely right occipital stroke. Patient stroke workup is in progress. Patient was scheduled for an MRI study, which was attempted today but unsuccessful because of apparent claustrophobia. CT scan of the head Barry been ordered and is pending. TEE was also ordered, as the patient has a history of valve replacement. NIH stroke score was 6. Patient was not administered TPA secondary to Beyond time under for treatment consideration. He was admitted for further evaluation and treatment.  I saw this pt in clinic just about one week ago. 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 In 2014, he again developed sepsis with bacteremia and TEE showed endocarditis. He had MV and TV repair in [facility]. Recently developed CHF and followed up with Dr. Bridgett Larsson in [facility] cardiology. He started to have left hemianopia two weeks ago and concerns for a new stroke. I have ordered him MRI and TEE and EEG and blood tests last week. Barry MRI not done,TEE scheduled today, EEG negative for seizure and A1C was super high at 13.7. He recently developed generalized weakness, lethargy and was admitted yesterday due to AKI and hyperglycemia.  SUBJECTIVE (INTERVAL HISTORY) Barry Haley is at the bedside.  Overall he feels Barry condition is gradually improving. He is on insulin drip and BG was currently under control. He had TEE scheduled this am. However, he is not able to get MRI done due  to constant moving around and claustrophobia. Barry Haley reported that he still seeing objects and figures at left visual field. Pt had no complains at this time and he wondered when he can have food.   OBJECTIVE  Dg Chest 2 View 04/22/2014   IMPRESSION: No active cardiopulmonary disease.     Ct Head Wo Contrast 04/21/2014  IMPRESSION: 1. No acute intracranial abnormality. 2. Atrophy and chronic microvascular white matter ischemic changes.    CUS 01/2013 This is an abnormal carotid duplex ultrasound with color flow imaging. .  There is 50-75% stenosis of the left ICA by Doppler criteria, although this  is likely at the low end of the range. No severe luminal narrowing noted,  although there is moderate plaque. The Vertebrals demonstrate foward flow  bilaterally . Insonation at multiple depths was attempted of the  [facility]. This is an abnormal Transcranial  Doppler Ultrasound examination. The right ACA velocity is slightly elevated,  but does not meet criteria for stenosis and most likely reflects hyperemia or  collateral flow. The resistance is increased in multiple vessels, which may  point to diffuse atherosclerosis or distal small vessel disease. However,  there is no evidence of stenosis, vasospasm, or flow diversion. There was no  previous study available for direct comparison.  BMET    Component Value Date/Time   NA 137 04/22/2014 0535   K 4.4 04/22/2014 0535   CL 104 04/22/2014 0535   CO2 22 04/22/2014 0535   GLUCOSE 161* 04/22/2014 0535   BUN 41* 04/22/2014 0535   CREATININE 1.83* 04/22/2014 0535   CALCIUM 8.6 04/22/2014 0535  GFRNONAA 36* 04/22/2014 0535   GFRAA 42* 04/22/2014 0535   CBC    Component Value Date/Time   WBC 6.8 04/21/2014 1951   RBC 4.75 04/21/2014 1951   RBC 2.79* 11/06/2008 1753   HGB 11.6* 04/21/2014 1951   HCT 34.8* 04/21/2014 1951   PLT 140* 04/21/2014 1951   MCV 73.3* 04/21/2014 1951   MCH 24.4* 04/21/2014 1951   MCHC 33.3  04/21/2014 1951   RDW 16.0* 04/21/2014 1951   LYMPHSABS 1.1 04/21/2014 1332   MONOABS 0.5 04/21/2014 1332   EOSABS 0.1 04/21/2014 1332   BASOSABS 0.0 04/21/2014 1332   Lipid Panel     Component Value Date/Time   CHOL  Value: 112        ATP III CLASSIFICATION:  <200     mg/dL   Desirable  200-239  mg/dL   Borderline High  >=240    mg/dL   High        01/04/2010 0700   TRIG 196* 04/16/2014 1604   HDL 25* 04/16/2014 1604   HDL 18* 01/04/2010 0700   CHOLHDL 5.7* 04/16/2014 1604   CHOLHDL 6.2 01/04/2010 0700   VLDL 22 01/04/2010 0700   LDLCALC 78 04/16/2014 1604   LDLCALC  Value: 72        Total Cholesterol/HDL:CHD Risk Coronary Heart Disease Risk Table                     Men   Women  1/2 Average Risk   3.4   3.3  Average Risk       5.0   4.4  2 X Average Risk   9.6   7.1  3 X Average Risk  23.4   11.0        Use the calculated Patient Ratio above and the CHD Risk Table to determine the patient's CHD Risk.        ATP III CLASSIFICATION (LDL):  <100     mg/dL   Optimal  100-129  mg/dL   Near or Above                    Optimal  130-159  mg/dL   Borderline  160-189  mg/dL   High  >190     mg/dL   Very High 01/04/2010 0700    Lab Results  Component Value Date   HGBA1C 13.7* 04/16/2014    PHYSICAL EXAM Physical exam  Temp:  [97.9 F (36.6 C)-98.2 F (36.8 C)] 98 F (36.7 C) (08/20 1205) Pulse Rate:  [43-61] 43 (08/20 1147) Resp:  [12-22] 16 (08/20 1147) BP: (88-176)/(41-97) 130/53 mmHg (08/20 1205) SpO2:  [93 %-100 %] 96 % (08/20 1147) Weight:  [215 lb 6.2 oz (97.7 kg)] 215 lb 6.2 oz (97.7 kg) (08/19 1855)  General - Well nourished, well developed, in no apparent distress.  Ophthalmologic - not able to keep eye still, not able to see fundus.  Cardiovascular - Regular rate and rhythm with no murmur with intermittent premature beats.  Mental Status -  Level of arousal and orientation to place, and person were intact, but not to time. Language including expression, naming, repetition, comprehension  was assessed and found intact.  Cranial Nerves II - XII - II - Visual field testing showed right hemianopia. He has psychological impersistence (can not keep looking forward, kept looking at fingers on testing). III, IV, VI - Extraocular movements intact. V - Facial sensation intact bilaterally. VII - Facial movement intact bilaterally.  VIII - Hearing & vestibular intact bilaterally. X - Palate elevates symmetrically. XI - Chin turning & shoulder shrug intact bilaterally. XII - Tongue protrusion intact.  Motor Strength - The patient's strength was normal in all extremities and pronator drift was absent.  Bulk was normal and fasciculations were absent.   Motor Tone - Muscle tone was assessed at the neck and appendages and was normal.  Reflexes - The patient's reflexes were 1+ in all extremities and he had no pathological reflexes.  Sensory - Light touch, temperature/pinprick were assessed and were normal.    Coordination - The patient had normal movements in the hands and feet with no ataxia or dysmetria.  Tremor was absent. However, he has mild asterixis on both hands.   Gait and Station - not tested.  ASSESSMENT/PLAN  Mr. Cambell Yeakey is a 68 y.o. 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   He was admitted this time for AKI, hyperglycemia and poorly controlled diabetes, recent A1C 13.7. He will need to have inpt MRI and MRA without contrast, TEE. He is on insulin drip and needs to have better control of DM as outpt. We will have Dr.  Kathyrn Sheriff to see him regarding the aneurysm evaluation and management.   Barry EEG was diffuse slowing and no seizure activity. 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  Barry etiology for previous stroke are most likely due to endocarditis. 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 However, if confirmed, Barry new stroke is most likely due to Barry poorly controlled stroke risk factors. Of course, recurrent endocarditis needs to be rule out. TEE will be done this am and blood culture so far NGTD. cardioembolic stroke still in the ddx but no source found yet this time.   Stroke:       aspirin 325 mg orally every day prior to admission, continue for now  MRI pending  MRA pending  2D Echo pending   Carotid pending  TEE today at 10a  LDL 78, goal of LDL <70, continue lipitor  HgbA1c 13.7, on insulin drip now  lovenox subq for VTE prophylaxis  NPO due to TEE this am  Therapy needs:  Pending, needs OT eval for hemianopia  Risk factor management/education  Patient counseled to be compliant with Barry antithrombotic medications  Hypertension   Home meds: amlodipine. Resumed in hospital  BP 120-160/40-60 past 24h  SBP goal 130/80  Stable  Patient counseled to be compliant with Barry blood pressure medications  Hyperlipidemia  LDL 78, continue lipitor  Patient on lipitor at home, continued in hospital (formulary)  LDL goal <70 for diabetics  Diabetes  HgbA1c 13.7   Uncontrolled  On insulin drip  Goal < 7.0  educate patient about lifestyle changes for diabetes prevention  Needs better control as outpt  Cerebral aneurysms - will have Dr. Kathyrn Sheriff to take a look  Hx of endocarditis - s/p MV and TV repair - blood culture so far NGTD - no sign of active infection - TEE  this am  Other Stroke Risk Factors Cigarette smoker, advised to stop smoking   Obesity, Body mass index is  32.76 kg/(m^2).    Hx stroke/TIA   coronary artery disease - CHF - OSA  Hospital day # 1  This is a complicated pt with complex PMH and active medical conditions. I had long discussion with pt and Barry Haley regarding current diagnosis, upcoming tests, and treatment plan. They understood pt current situation, and all questions are answered.  Rosalin Hawking, MD PhD Stroke Neurology 04/22/2014 2:38 PM  To contact Stroke Continuity provider, please refer to [email or web address] After hours, contact General Neurology

## 2014-04-22 NOTE — Telephone Encounter (Signed)
Dr. Acie Fredrickson aware

## 2014-04-22 NOTE — Telephone Encounter (Signed)
Called patient daughter she stated her father is a in patient at the hospital. His sleep study has  Been cancelled until he is feeling better.

## 2014-04-22 NOTE — CV Procedure (Addendum)
    Transesophageal Echocardiogram Note  Barry Haley [id number] August 15, 1946  Procedure: Transesophageal Echocardiogram Indications: hx of endocarditis,  Altered mental status, DKA,   Procedure Details Consent: Obtained Time Out: Verified patient identification, verified procedure, site/side was marked, verified correct patient position, special equipment/implants available, Radiology Safety Procedures followed,  medications/allergies/relevent history reviewed, required imaging and test results available.  Performed  Medications: Fentanyl: 25 mcg iv Versed: 5 mg   Patient had altered mental status at baseline.  We were not able to give him much sedation.  The TEE was technically difficult  Left Ventrical:  Moderate LV dysfunction - EF 35-40% Mitral Valve: s/p repair, no vegetation. Mild MR Aortic Valve: normal Tricuspid Valve: not well seen. No obvious vegetations Pulmonic Valve: poorly visualized Left Atrium/ Left atrial appendage: small, may have been surgically ligated.  Atrial septum: + PFO by by bubble study Aorta: normal  Complications: No apparent complications Patient did tolerate procedure well.   Impression:   No vegetations seen + PFO ( markedly positive)  He is bradycardic and LV function is moderately reduced. I have DCd his Diltiazem for now..    Transthoracic echo to be done.   Thayer Headings, Brooke Bonito., MD, [facility] 04/22/2014, 11:18 AM

## 2014-04-22 NOTE — Progress Notes (Signed)
Barry Haley TEAM 1 - Stepdown/ICU TEAM Progress Note  Barry Haley [id number] DOB: [birth year] DOA: 04/21/2014 PCP: [name], MD  Admit HPI / Brief Narrative: 68 year old WM PMHx Hx valve/mitral valve repair (with his own tissue), Hypertension, hyperlipidemia, diabetes mellitus, anxiety, stroke (2010 per patient), multiple cerebral aneurysms (last evaluated by MRI on 01/26/2012 see results below. presents with one-week history of "floaters" in his visual field. Apparently, the patient went to see his ophthalmologist and was diagnosed with a left hemianopsia.  and likely right occipital stroke.The patient was sent to neurology. He saw Dr. Erlinda Hong on 8/14 and outpt stroke workup was started, but he has not had an MRI. Unfortunately, the patient also has had increasing confusion that has worsened since 04/16/2014. He was noted to have hemoglobin A1c of 13.7 on 04/16/2014. He was instructed to go to his primary care physician whom he saw on the day of admission. His CBG was over 450 in the office, and he was sent to the emergency department. In addition, the patient has had gait instability with mechanical fall 2 days prior to this admission. There was no syncope. The patient is pleasantly confused presently, and is unable to provide any significant history. However he denies any fevers, chills, chest pain, shortness breath, nausea, vomiting, diarrhea, abdominal pain, dysuria, hematuria. 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 He had a full recovery.  In emergency department, the patient had serum glucose 480 anion gap of 15. He was given 1 L normal saline. MRI was ordered, but he was not able to tolerate it due to 2 phobia and agitation. Urinalysis was negative for pyuria. Urine drug screen and alcohol were negative. EKG shows sinus rhythm with ST depression V1 to V3. NIH stroke score was 6.     HPI/Subjective: 8/20 A./O. x2, (does not know when, why), negative SOB,  negative CP, negative N./V. WIFE states his sinus symptoms began approximately a week ago, started to have hallucinations (seeing people and animals that weren't there)  Assessment/Plan: DKA -8/20 AG= 11 -Discontinue glucose stabilizer -Transition patient onto Lantus 8 units -Moderate SSI -Patient will have to be placed on an insulin regimen prior to discharge. -8/14 hemoglobin A1c=13.7  Left hemianopsia  -Neurology has seen the patient  -MRI brain pending (spoke with [facility] anesthesia tech x [phone] patient will be sedated with propofol on 8/21) to obtain MRI; n.p.o. after midnight -Carotid ultrasound pending -Echocardiogram pending -LDL 78   Abnormal EKG  -Presently without chest pain  -Troponins x2 negative    Hypertension  -Continue amlodipine 2.5 mg daily -Continue metoprolol 100 mg daily   Acute on Chronic renal failure (CKD2)  -due to volume depletion  -Continue normal saline at 125 ml/hr   -baseline creatinine around 1.2 (value from 01/2010) -hold furosemide  -Hold all nephrotoxic medication  Depression/anxiety  -Continue Celexa and clonazepam   Diabetes mellitus type 2, uncontrolled  -Uncontrolled on home medications; Discontinue Amaryl and Januvia  -Will need to ensure patient is on adequate insulin regimen prior to discharge.     Code Status: FULL Family Communication: Wife present  Disposition Plan: Per neurology    Consultants: Dr. Wallie Char (neurology)    Procedure/Significant Events: 01/24/2012 brain MRI; no significant stenosis of medium to large sized intracranial vessels. 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 No significant change compared with CT angiogram 11/13/2010   Culture 8/19 MRSA by PCR negative  Antibiotics: NA  DVT prophylaxis: Lovenox  Devices NA   LINES / TUBES:  8/19 20ga right antecubital    Continuous Infusions: . sodium chloride Stopped  (04/22/14 0108)  . sodium chloride    . dextrose 5 % and 0.45% NaCl 75 mL/hr at 04/22/14 0110  . insulin (NOVOLIN-R) infusion 2.8 Units/hr (04/22/14 0805)    Objective: VITAL SIGNS: Temp: 98 F (36.7 C) (08/20 0400) Temp src: Oral (08/20 0400) BP: 136/60 mmHg (08/20 0700) Pulse Rate: 48 (08/20 0700) SPO2; 96% on room air FIO2:   Intake/Output Summary (Last 24 hours) at 04/22/14 0841 Last data filed at 04/22/14 0701  Gross per 24 hour  Intake   1155 ml  Output    400 ml  Net    755 ml     Exam: General: A./O. x2, (does not know when, why), NAD, No acute respiratory distress Lungs: Clear to auscultation bilaterally without wheezes or crackles Cardiovascular: Regular rate and rhythm without murmur gallop or rub normal S1 and S2 Abdomen: Nontender, nondistended, soft, bowel sounds positive, no rebound, no ascites, no appreciable mass Extremities: No significant cyanosis, clubbing, or edema bilateral lower extremities Neurologic; pupils equal round reactive to light and accommodation, cranial nerves II through XII intact, tongue/uvula midline, mild slowing of speech, extremity strength 5/5, sensation intact, did not angulate patient  Data Reviewed: Basic Metabolic Panel:  Recent Labs Lab 04/21/14 1332 04/21/14 1951 04/21/14 2220 04/22/14 0040 04/22/14 0535  NA 127* 131* 131* 133* 137  K 5.0 4.8 4.5 3.8 4.4  CL 91* 95* 97 100 104  CO2 21 23 24 22 22   GLUCOSE 480* 402* 338* 161* 161*  BUN 50* 47* 46* 44* 41*  CREATININE 2.27* 2.10* 2.02* 2.04* 1.83*  CALCIUM 8.9 8.5 8.7 8.7 8.6   Liver Function Tests:  Recent Labs Lab 04/21/14 1332  AST 13  ALT 13  ALKPHOS 134*  BILITOT 0.5  PROT 6.6  ALBUMIN 2.9*   No results found for this basename: LIPASE, AMYLASE,  in the last 168 hours No results found for this basename: AMMONIA,  in the last 168 hours CBC:  Recent Labs Lab 04/21/14 1332 04/21/14 1951  WBC 8.2 6.8  NEUTROABS 6.5  --   HGB 12.1* 11.6*  HCT  35.5* 34.8*  MCV 71.1* 73.3*  PLT 145* 140*   Cardiac Enzymes:  Recent Labs Lab 04/21/14 1951 04/22/14 0040  TROPONINI <0.30 <0.30   BNP (last 3 results) No results found for this basename: PROBNP,  in the last 8760 hours CBG:  Recent Labs Lab 04/22/14 0405 04/22/14 0516 04/22/14 0604 04/22/14 0700 04/22/14 0755  GLUCAP 163* 188* 177* 174* 153*    Recent Results (from the past 240 hour(s))  CULTURE, BLOOD (SINGLE)     Status: None   Collection Time    04/16/14  4:04 PM      Result Value Ref Range Status   BLOOD CULTURE, ROUTINE Preliminary report   Preliminary   RESULT 1 Comment   Preliminary   Comment: No growth in 36 - 48 hours.  CULTURE, BLOOD (SINGLE)     Status: None   Collection Time    04/16/14  4:04 PM      Result Value Ref Range Status   BLOOD CULTURE, ROUTINE Preliminary report   Preliminary   RESULT 1 Comment   Preliminary   Comment: No growth in 36 - 48 hours.  MRSA PCR SCREENING     Status: None   Collection Time    04/21/14  7:10 PM  Result Value Ref Range Status   MRSA by PCR NEGATIVE  NEGATIVE Final   Comment:            The GeneXpert MRSA Assay (FDA     approved for NASAL specimens     only), is one component of a     comprehensive MRSA colonization     surveillance program. It is not     intended to diagnose MRSA     infection nor to guide or     monitor treatment for     MRSA infections.     Studies:  Recent x-ray studies have been reviewed in detail by the Attending Physician  Scheduled Meds:  Scheduled Meds: .  stroke: mapping our early stages of recovery book   Does not apply Once  . amLODipine  2.5 mg Oral Daily  . antiseptic oral rinse  7 mL Mouth Rinse BID  . aspirin  300 mg Rectal Daily   Or  . aspirin  325 mg Oral Daily  . atorvastatin  10 mg Oral QHS  . citalopram  40 mg Oral Daily  . diltiazem  240 mg Oral Daily  . enoxaparin (LOVENOX) injection  40 mg Subcutaneous Q24H  . fluticasone  2 spray Each Nare Daily    . LORazepam  1 mg Intravenous Once  . metoprolol succinate  100 mg Oral Daily  . pantoprazole  80 mg Oral Q1200  . tamsulosin  0.4 mg Oral BID  . vitamin B-12  1,000 mcg Oral Daily    Time spent on care of this patient: 40 mins   Allie Bossier , MD   [facility] Office  [phone] Pager - [phone]  On-Call/Text Page:      [email or web address]      password [email or web address]  If 7PM-7AM, please contact night-coverage [email or web address] Password [email or web address] 04/22/2014, 8:41 AM   LOS: 1 day

## 2014-04-22 NOTE — Progress Notes (Signed)
Rehab Admissions Coordinator Note:  Patient was screened by Solymar Grace L for appropriateness for an Inpatient Acute Rehab Consult.  At this time, we are recommending Inpatient Rehab consult.  Deshannon Hinchliffe, PT Rehabilitation Admissions Coordinator [phone]  

## 2014-04-22 NOTE — Progress Notes (Signed)
Utilization review completed.  

## 2014-04-22 NOTE — Telephone Encounter (Signed)
Pt in the hospital [facility]) now.  Will await discharged and then proceed from there.

## 2014-04-23 ENCOUNTER — Telehealth: Payer: Self-pay | Admitting: *Deleted

## 2014-04-23 ENCOUNTER — Encounter (HOSPITAL_COMMUNITY): Payer: Self-pay | Admitting: Cardiovascular Disease

## 2014-04-23 ENCOUNTER — Inpatient Hospital Stay (HOSPITAL_COMMUNITY): Payer: Medicare Other

## 2014-04-23 ENCOUNTER — Telehealth: Payer: Self-pay | Admitting: Neurology

## 2014-04-23 ENCOUNTER — Encounter (HOSPITAL_COMMUNITY): Payer: Medicare Other | Admitting: Certified Registered"

## 2014-04-23 ENCOUNTER — Encounter (HOSPITAL_COMMUNITY)
Admission: EM | Disposition: A | Discharge status: Skilled Nursing Facility | Payer: Medicare Other | Source: Home / Self Care | Attending: Internal Medicine

## 2014-04-23 ENCOUNTER — Inpatient Hospital Stay (HOSPITAL_COMMUNITY): Payer: Medicare Other | Admitting: Certified Registered"

## 2014-04-23 DIAGNOSIS — I6340 Cerebral infarction due to embolism of unspecified cerebral artery: Secondary | ICD-10-CM

## 2014-04-23 DIAGNOSIS — I6330 Cerebral infarction due to thrombosis of unspecified cerebral artery: Secondary | ICD-10-CM

## 2014-04-23 HISTORY — PX: RADIOLOGY WITH ANESTHESIA: SHX6223

## 2014-04-23 LAB — GLUCOSE, CAPILLARY
GLUCOSE-CAPILLARY: 150 mg/dL — AB (ref 70–99)
GLUCOSE-CAPILLARY: 187 mg/dL — AB (ref 70–99)
GLUCOSE-CAPILLARY: 175 mg/dL — AB (ref 70–99)
Glucose-Capillary: 114 mg/dL — ABNORMAL HIGH (ref 70–99)
Glucose-Capillary: 152 mg/dL — ABNORMAL HIGH (ref 70–99)

## 2014-04-23 LAB — COMPREHENSIVE METABOLIC PANEL
ALBUMIN: 2.6 g/dL — AB (ref 3.5–5.2)
ALT: 15 U/L (ref 0–53)
AST: 15 U/L (ref 0–37)
Alkaline Phosphatase: 113 U/L (ref 39–117)
Anion gap: 10 (ref 5–15)
BUN: 37 mg/dL — ABNORMAL HIGH (ref 6–23)
CO2: 23 mEq/L (ref 19–32)
Calcium: 8.8 mg/dL (ref 8.4–10.5)
Chloride: 101 mEq/L (ref 96–112)
Creatinine, Ser: 1.87 mg/dL — ABNORMAL HIGH (ref 0.50–1.35)
GFR calc Af Amer: 41 mL/min — ABNORMAL LOW (ref >90)
GFR calc non Af Amer: 35 mL/min — ABNORMAL LOW (ref >90)
Glucose, Bld: 126 mg/dL — ABNORMAL HIGH (ref 70–99)
Potassium: 4.3 mEq/L (ref 3.7–5.3)
SODIUM: 134 meq/L — AB (ref 137–147)
TOTAL PROTEIN: 5.9 g/dL — AB (ref 6.0–8.3)
Total Bilirubin: 0.4 mg/dL (ref 0.3–1.2)

## 2014-04-23 LAB — CBC WITH DIFFERENTIAL/PLATELET
BASOS PCT: 0 % (ref 0–1)
Basophils Absolute: 0 10*3/uL (ref 0.0–0.1)
Eosinophils Absolute: 0.3 10*3/uL (ref 0.0–0.7)
Eosinophils Relative: 5 % (ref 0–5)
HEMATOCRIT: 36.5 % — AB (ref 39.0–52.0)
HEMOGLOBIN: 11.7 g/dL — AB (ref 13.0–17.0)
Lymphocytes Relative: 18 % (ref 12–46)
Lymphs Abs: 1.2 10*3/uL (ref 0.7–4.0)
MCH: 24.2 pg — AB (ref 26.0–34.0)
MCHC: 32.1 g/dL (ref 30.0–36.0)
MCV: 75.6 fL — ABNORMAL LOW (ref 78.0–100.0)
MONOS PCT: 10 % (ref 3–12)
Monocytes Absolute: 0.7 10*3/uL (ref 0.1–1.0)
NEUTROS ABS: 4.5 10*3/uL (ref 1.7–7.7)
Neutrophils Relative %: 66 % (ref 43–77)
Platelets: 136 10*3/uL — ABNORMAL LOW (ref 150–400)
RBC: 4.83 MIL/uL (ref 4.22–5.81)
RDW: 16.6 % — ABNORMAL HIGH (ref 11.5–15.5)
WBC: 6.7 10*3/uL (ref 4.0–10.5)

## 2014-04-23 LAB — MAGNESIUM: Magnesium: 2.2 mg/dL (ref 1.5–2.5)

## 2014-04-23 SURGERY — RADIOLOGY WITH ANESTHESIA
Anesthesia: General

## 2014-04-23 MED ORDER — CITALOPRAM HYDROBROMIDE 10 MG PO TABS
20.0000 mg | ORAL_TABLET | Freq: Every day | ORAL | Status: DC
  Administered 2014-04-24 – 2014-04-27 (×4): 20 mg via ORAL
  Filled 2014-04-23: qty 1
  Filled 2014-04-23 (×3): qty 2

## 2014-04-23 MED ORDER — GADOBENATE DIMEGLUMINE 529 MG/ML IV SOLN
20.0000 mL | Freq: Once | INTRAVENOUS | Status: AC
  Administered 2014-04-23: 10 mL via INTRAVENOUS

## 2014-04-23 MED ORDER — DEXTROSE-NACL 5-0.9 % IV SOLN: INTRAVENOUS | Status: DC

## 2014-04-23 MED ORDER — LACTATED RINGERS IV SOLN
INTRAVENOUS | Status: DC | PRN
  Administered 2014-04-23: 12:00:00 via INTRAVENOUS

## 2014-04-23 MED ORDER — LACTATED RINGERS IV SOLN
INTRAVENOUS | Status: DC
Start: 2014-04-23 — End: 2014-04-23
  Administered 2014-04-23: 50 mL/h via INTRAVENOUS

## 2014-04-23 NOTE — Telephone Encounter (Signed)
Spoke to [place].  Relayed to her that Dr. Erlinda Hong has seen father in [facility].  MRI's and sleep appts have been cancelled.  Hopefully will get in the hospital.  She will call back as needed.

## 2014-04-23 NOTE — Transfer of Care (Signed)
Immediate Anesthesia Transfer of Care Note  Patient: Barry Haley  Procedure(s) Performed: Procedure(s): RADIOLOGY WITH ANESTHESIA (N/A)  Patient Location: PACU  Anesthesia Type:MAC  Level of Consciousness: awake and alert   Airway & Oxygen Therapy: Patient connected to nasal cannula oxygen  Post-op Assessment: Report given to PACU RN  Post vital signs: stable  Complications: No apparent anesthesia complications

## 2014-04-23 NOTE — Consult Note (Signed)
Physical Medicine and Rehabilitation Consult  Reason for Consult: Unsteady gait, visual changes and confusion Referring Physician: Dr. Thereasa Solo.    HPI: Barry Haley is a 68 y.o. 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 Patient with vision changes with Left hemianopsia 10 days PTA and was in process of stroke work up.  CT head without acute abnormality.  MRI not done due to claustrophobia. Carotid dopplers without significant ICA stenosis.  TEE today with moderate LVD with EF 35-40%, +PFO with bubble study. EEG negative for seizure. Hgb A1c-13.7.  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 Work up ongoing at this time. PT evaluation done yesterday and patient noted to have lethargy, difficulty with problem solving and following commands as well as confusion. MD, PT recommending CIR.     Review of Systems  HENT: Negative for hearing loss.   Eyes: Positive for blurred vision.       Visual hallucinations in left field.   Respiratory: Negative for cough and shortness of breath.   Cardiovascular: Positive for leg swelling (recent problems with BLE edema). Negative for chest pain and palpitations.  Gastrointestinal: Negative for heartburn, nausea and vomiting.  Musculoskeletal: Negative for back pain and myalgias.  Neurological: Positive for focal weakness. Negative for dizziness, tingling and headaches.  Psychiatric/Behavioral: Positive for memory loss.    Past Medical History  Diagnosis Date  . Hypertension   . Hyperlipidemia   . Diabetes   . Cerebral aneurysm, nonruptured   . Epistaxis   .  Anxiety state, unspecified   . Unspecified cerebral artery occlusion with cerebral infarction   . Other shock without mention of trauma   . Personal history of other disorder of urinary system   . Personal history of other diseases of digestive system   . Hx of migraines   . Sleep apnea   . Meningitis 2010   Past Surgical History  Procedure Laterality Date  . Appendectomy      child  . 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 Mitral valve reconstruction with cardiopulmonary bypass  01/27/2013  . Tee without cardioversion N/A 04/22/2014    Procedure: TRANSESOPHAGEAL ECHOCARDIOGRAM (TEE);  Surgeon: Thayer Headings, MD;  Location: [facility] ENDOSCOPY;  Service: Cardiovascular;  Laterality: N/A;   Family History  Problem Relation Age of Onset  . Heart failure Mother   . Diabetes Mother   . Cervical cancer Mother   . Alzheimer's disease Father   . Diabetes Father   . Diabetes Brother    Social History:  Married. Lives in [place] with wife. Retired from [employer]. He quit smoking about 5 years ago. His smoking use included Cigarettes. He has a 100 pack-year smoking history. He has never used smokeless tobacco. Per reports that he drinks alcohol. He reports that he uses illicit drugs.   Allergies  Allergen Reactions  . Hctz [Hydrochlorothiazide] Other (See Comments)    Renal insufficiency  . Niaspan [Niacin Er] Itching  . Zolpidem Anxiety and Other (See Comments)    Bad dreams    Medications Prior to Admission  Medication Sig Dispense Refill  . ALPRAZolam (XANAX) 0.5 MG tablet Take 1 tablet (0.5  mg total) by mouth at bedtime as needed for anxiety.  2 tablet  0  . amLODipine (NORVASC) 5 MG tablet Take 2.5 mg by mouth daily.       Marland Kitchen aspirin 325 MG tablet Take 1 tablet (325 mg total) by mouth daily.  30 tablet  2  . atorvastatin (LIPITOR) 10 MG tablet Take 10 mg by mouth at bedtime.      . citalopram (CELEXA) 40 MG tablet Take 1 tablet (40 mg total) by mouth daily.  90 tablet  3    . clonazePAM (KLONOPIN) 0.5 MG tablet Take 1 tablet (0.5 mg total) by mouth every 8 (eight) hours as needed.  30 tablet  5  . diltiazem (CARDIZEM CD) 240 MG 24 hr capsule Take 240 mg by mouth daily.      Marland Kitchen esomeprazole (NEXIUM) 40 MG capsule Take 40 mg by mouth daily.       . fluticasone (VERAMYST) 27.5 MCG/SPRAY nasal spray Place 2 sprays into the nose daily.      . furosemide (LASIX) 40 MG tablet Take 40 mg by mouth daily.      Marland Kitchen glimepiride (AMARYL) 1 MG tablet Take 0.5 mg by mouth daily with breakfast.       . metoprolol succinate (TOPROL-XL) 50 MG 24 hr tablet Take 50 mg by mouth 2 (two) times daily. Take with or immediately following a meal.      . sitaGLIPtin (JANUVIA) 50 MG tablet Take 50 mg by mouth daily.      . Tamsulosin HCl (FLOMAX) 0.4 MG CAPS Take 0.4 mg by mouth 2 (two) times daily.       . vitamin B-12 (CYANOCOBALAMIN) 1000 MCG tablet Take 1,000 mcg by mouth daily.          Home: Home Living Family/patient expects to be discharged to:: Private residence Living Arrangements: Spouse/significant other Available Help at Discharge: Family;Available 24 hours/day Type of Home: House Home Access: Stairs to enter [employer] of Steps: 5 Entrance Stairs-Rails: Left;Can reach both;Right Home Layout: One level Home Equipment: None Additional Comments: has walk in shower  Functional History: Prior Function Level of Independence: Independent Comments: pt drives  Functional Status:  Mobility: Bed Mobility Overal bed mobility: Needs Assistance Bed Mobility: Supine to Sit Supine to sit: Mod assist;HOB elevated General bed mobility comments: pt with difficulty following commands; required mod (A) to advanec LEs off EOB and intitiate elevation of trunk to sitting position; pt more awake with sitting EOB but continued to be inconsistent with responding to questions and following commands Transfers Overall transfer level: Needs assistance Equipment used: 2 person hand  held assist Transfers: Sit to/from [employer] Transfers Sit to Stand: +2 physical assistance;+2 safety/equipment;Min assist Stand pivot transfers: +2 safety/equipment;Mod assist General transfer comment: pt with intermittent buckling of LEs with transfers; 2 person (A) due to lethargy; pt was able to follow commands to perform pivotal steps but did not follow commands for safe descent to chair; unsafe to progress mobility at this time  Ambulation/Gait General Gait Details: unable to at this time due to lethargy    ADL:    Cognition: Cognition Overall Cognitive Status: Difficult to assess Orientation Level: Oriented to person;Oriented to place;Oriented to time Cognition Arousal/Alertness: Lethargic;Suspect due to medications Behavior During Therapy: Restless Overall Cognitive Status: Difficult to assess Area of Impairment: Orientation;Attention;Memory;Following commands;Safety/judgement;Awareness;Problem solving Orientation Level: Disoriented to;Time;Situation Current Attention Level: Focused Memory: Decreased short-term memory Following Commands: Follows one step commands inconsistently;Follows one step commands with increased time  Safety/Judgement: Decreased awareness of deficits;Decreased awareness of safety Awareness: Intellectual Problem Solving: Slow processing;Decreased initiation;Difficulty sequencing;Requires verbal cues;Requires tactile cues General Comments: wife reports pt has been having decr STM for sometime; responds to questions with tactile cues and incr time; no always accurate statements; unable to provide current year, president or situation  Difficult to assess due to: Level of arousal  Blood pressure 136/60, pulse 49, temperature 98.2 F (36.8 C), temperature source Oral, resp. rate 13, height 5\' 8"  (1.727 m), weight 97.7 kg (215 lb 6.2 oz), SpO2 100.00%. Physical Exam  Nursing note and vitals reviewed. Constitutional: He appears well-developed and  well-nourished.  Sitting up in chair without balance deficits. Occasionally batting at things in left field.   HENT:  Head: Normocephalic and atraumatic.  Eyes: Conjunctivae are normal. Pupils are equal, round, and reactive to light.  Neck: Normal range of motion. Neck supple.  Cardiovascular: Normal rate and regular rhythm.   Respiratory: Effort normal and breath sounds normal.  GI: Soft. Bowel sounds are normal. He exhibits no distension. There is no tenderness.  Musculoskeletal: He exhibits no edema and no tenderness.  Neurological: He is alert.  Oriented to self and situation. Place "[employer]".  Able to state date, DOB and recent events. Memory deficits noted with occasional word finding deficits.  He is able to follow simple one and two step commands. Delayed responses and initiation. Left inattention with left field cut. Mild ataxia with right finger to nose and RLE.  Mild left sided weakness with associated decrease in [facility] LUE>LLE.   Skin: Skin is warm and dry.    Results for orders placed during the hospital encounter of 04/21/14 (from the past 24 hour(s))  GLUCOSE, CAPILLARY     Status: Abnormal   Collection Time    04/22/14  9:19 AM      Result Value Ref Range   Glucose-Capillary 157 (*) 70 - 99 mg/dL  GLUCOSE, CAPILLARY     Status: Abnormal   Collection Time    04/22/14 12:08 PM      Result Value Ref Range   Glucose-Capillary 166 (*) 70 - 99 mg/dL   Comment 1 Notify RN     Comment 2 Documented in Chart    GLUCOSE, CAPILLARY     Status: Abnormal   Collection Time    04/22/14  3:16 PM      Result Value Ref Range   Glucose-Capillary 130 (*) 70 - 99 mg/dL  GLUCOSE, CAPILLARY     Status: Abnormal   Collection Time    04/22/14  7:46 PM      Result Value Ref Range   Glucose-Capillary 169 (*) 70 - 99 mg/dL  GLUCOSE, CAPILLARY     Status: Abnormal   Collection Time    04/22/14 11:13 PM      Result Value Ref Range   Glucose-Capillary 155 (*) 70 - 99 mg/dL  GLUCOSE,  CAPILLARY     Status: Abnormal   Collection Time    04/23/14  3:17 AM      Result Value Ref Range   Glucose-Capillary 114 (*) 70 - 99 mg/dL  COMPREHENSIVE METABOLIC PANEL     Status: Abnormal   Collection Time    04/23/14  3:40 AM      Result Value Ref Range   Sodium 134 (*) 137 - 147 mEq/L   Potassium 4.3  3.7 - 5.3 mEq/L   Chloride 101  96 - 112 mEq/L   CO2 23  19 - 32  mEq/L   Glucose, Bld 126 (*) 70 - 99 mg/dL   BUN 37 (*) 6 - 23 mg/dL   Creatinine, Ser 1.87 (*) 0.50 - 1.35 mg/dL   Calcium 8.8  8.4 - 10.5 mg/dL   Total Protein 5.9 (*) 6.0 - 8.3 g/dL   Albumin 2.6 (*) 3.5 - 5.2 g/dL   AST 15  0 - 37 U/L   ALT 15  0 - 53 U/L   Alkaline Phosphatase 113  39 - 117 U/L   Total Bilirubin 0.4  0.3 - 1.2 mg/dL   GFR calc non Af Amer 35 (*) >90 mL/min   GFR calc Af Amer 41 (*) >90 mL/min   Anion gap 10  5 - 15  CBC WITH DIFFERENTIAL     Status: Abnormal   Collection Time    04/23/14  3:40 AM      Result Value Ref Range   WBC 6.7  4.0 - 10.5 K/uL   RBC 4.83  4.22 - 5.81 MIL/uL   Hemoglobin 11.7 (*) 13.0 - 17.0 g/dL   HCT 36.5 (*) 39.0 - 52.0 %   MCV 75.6 (*) 78.0 - 100.0 fL   MCH 24.2 (*) 26.0 - 34.0 pg   MCHC 32.1  30.0 - 36.0 g/dL   RDW 16.6 (*) 11.5 - 15.5 %   Platelets 136 (*) 150 - 400 K/uL   Neutrophils Relative % 66  43 - 77 %   Neutro Abs 4.5  1.7 - 7.7 K/uL   Lymphocytes Relative 18  12 - 46 %   Lymphs Abs 1.2  0.7 - 4.0 K/uL   Monocytes Relative 10  3 - 12 %   Monocytes Absolute 0.7  0.1 - 1.0 K/uL   Eosinophils Relative 5  0 - 5 %   Eosinophils Absolute 0.3  0.0 - 0.7 K/uL   Basophils Relative 0  0 - 1 %   Basophils Absolute 0.0  0.0 - 0.1 K/uL  MAGNESIUM     Status: None   Collection Time    04/23/14  3:40 AM      Result Value Ref Range   Magnesium 2.2  1.5 - 2.5 mg/dL  GLUCOSE, CAPILLARY     Status: Abnormal   Collection Time    04/23/14  7:58 AM      Result Value Ref Range   Glucose-Capillary 150 (*) 70 - 99 mg/dL   Comment 1 Notify RN     Comment 2  Documented in Chart     Dg Chest 2 View  04/22/2014   CLINICAL DATA:  Stroke.  No chest complaints  EXAM: CHEST  2 VIEW  COMPARISON:  01/21/2013  FINDINGS: Cardiomegaly which is decreased from 2014. There has been median sternotomy for mitral and tricuspid annuloplasty. Negative upper mediastinal contours. There is no edema, consolidation, effusion, or pneumothorax.  IMPRESSION: No active cardiopulmonary disease.   Electronically Signed   By: Jorje Guild M.D.   On: 04/22/2014 01:47   Ct Head Wo Contrast  04/21/2014   CLINICAL DATA:  Confusion, gait instability.  EXAM: CT HEAD WITHOUT CONTRAST  TECHNIQUE: Contiguous axial images were obtained from the base of the skull through the vertex without intravenous contrast.  COMPARISON:  11/13/2010.  FINDINGS: No evidence of an acute infarct, acute hemorrhage, mass lesion, mass effect or hydrocephalus. Atrophy. Periventricular low attenuation. Small area of encephalomalacia in the posterior left parietal lobe (image 24). There may be a small remote infarct in the left cerebellum. Retention  cyst or polyp in the right maxillary sinus. Mastoid air cells are clear.  IMPRESSION: 1. No acute intracranial abnormality. 2. Atrophy and chronic microvascular white matter ischemic changes.   Electronically Signed   By: Lorin Picket M.D.   On: 04/21/2014 18:22    Assessment/Plan: Diagnosis: ?right posterior ciculation stroke 1. Does the need for close, 24 hr/day medical supervision in concert with the patient's rehab needs make it unreasonable for this patient to be served in a less intensive setting? Yes 2. Co-Morbidities requiring supervision/potential complications: htn, chf, ckd 3. Due to bladder management, bowel management, safety, skin/wound care, disease management, medication administration, pain management and patient education, does the patient require 24 hr/day rehab nursing? Yes 4. Does the patient require coordinated care of a physician, rehab nurse,  PT (1-2 hrs/day, 5 days/week), OT (1-2 hrs/day, 5 days/week) and SLP (1-2 hrs/day, 5 days/week) to address physical and functional deficits in the context of the above medical diagnosis(es)? Yes Addressing deficits in the following areas: balance, endurance, locomotion, strength, transferring, bowel/bladder control, bathing, dressing, feeding, grooming, toileting, cognition and psychosocial support 5. Can the patient actively participate in an intensive therapy program of at least 3 hrs of therapy per day at least 5 days per week? Yes 6. The potential for patient to make measurable gains while on inpatient rehab is excellent 7. Anticipated functional outcomes upon discharge from inpatient rehab are supervision/mod I with PT, modified independent and supervision with OT, modified independent and supervision with SLP. 8. Estimated rehab length of stay to reach the above functional goals is: 9-13 days 9. Does the patient have adequate social supports to accommodate these discharge functional goals? Yes 10. Anticipated D/C setting: Home 11. Anticipated post D/C treatments: HH therapy and Outpatient therapy 12. Overall Rehab/Functional Prognosis: excellent  RECOMMENDATIONS: This patient's condition is appropriate for continued rehabilitative care in the following setting: CIR Patient has agreed to participate in recommended program. Yes and Potentially Note that insurance prior authorization may be required for reimbursement for recommended care.  Comment: Rehab Admissions Coordinator to follow up.  Thanks,  Meredith Staggers, MD, Mellody Drown     04/23/2014

## 2014-04-23 NOTE — Progress Notes (Signed)
Speech Pathology: Cancelled evaluation - Pt at procedure  Mishka Stegemann L. Tivis Ringer, [place] CCC/SLP Pager [phone]

## 2014-04-23 NOTE — Anesthesia Postprocedure Evaluation (Signed)
Anesthesia Post Note  Patient: Barry Haley  Procedure(s) Performed: Procedure(s) (LRB): RADIOLOGY WITH ANESTHESIA (N/A)  Anesthesia type: MAC  Patient location: PACU  Post pain: Pain level controlled  Post assessment: Patient's Cardiovascular Status Stable  Last Vitals:  Filed Vitals:   04/23/14 1545  BP: 158/71  Pulse: 67  Temp: 36.7 C  Resp: 14    Post vital signs: Reviewed and stable  Level of consciousness: sedated  Complications: No apparent anesthesia complications

## 2014-04-23 NOTE — Telephone Encounter (Signed)
Daughter called and is letting us know that pt is admitted to [facility] for Hypotension and Hyperglycemia.  ? MRI scheduled for Sunday to cancel if doing in hospital.   I called and LMVM for Anderson Malta that we were aware, after Dr. Lin Landsman called [place].  There was some question about were he may have ended up.  Dr. Erlinda Hong has seen pt in [facility] 04-22-14.  She can call and cancel the appt and then reschedule if needed.

## 2014-04-23 NOTE — Anesthesia Preprocedure Evaluation (Addendum)
Anesthesia Evaluation  Patient identified by MRN, date of birth, ID band Patient awake    Reviewed: Allergy & Precautions, H&P , NPO status , Patient's Chart, lab work & pertinent test results, reviewed documented beta blocker date and time   History of Anesthesia Complications (+) DIFFICULT AIRWAY and history of anesthetic complications  Airway Mallampati: II TM Distance: >3 FB Neck ROM: Full    Dental  (+) Teeth Intact, Dental Advisory Given   Pulmonary shortness of breath and with exertion, sleep apnea and Continuous Positive Airway Pressure Ventilation , former smoker,    Pulmonary exam normal       Cardiovascular hypertension, Pt. on medications + CAD and + Peripheral Vascular Disease     Neuro/Psych PSYCHIATRIC DISORDERS Anxiety CVA    GI/Hepatic (+)     substance abuse  alcohol use and marijuana use,   Endo/Other  diabetes, Well Controlled, Type 1, Insulin Dependent  Renal/GU Renal InsufficiencyRenal disease     Musculoskeletal   Abdominal   Peds  Hematology   Anesthesia Other Findings Left Ventrical:  Moderate LV dysfunction - EF 35-40% Mitral Valve: s/p repair, no vegetation. Mild MR Aortic Valve: normal Tricuspid Valve: not well seen. No obvious vegetations Pulmonic Valve: poorly visualized Left Atrium/ Left atrial appendage: small, may have been surgically ligated.   + PFO.  Difficult intubation requiring glide for surgery at [facility]. Atrial septum: + PFO by by bubble study Aorta: normal     Reproductive/Obstetrics                      Anesthesia Physical Anesthesia Plan  ASA: III  Anesthesia Plan:    Post-op Pain Management:    Induction: Intravenous  Airway Management Planned: LMA  Additional Equipment:   Intra-op Plan:   Post-operative Plan: Extubation in OR  Informed Consent: I have reviewed the patients History and Physical, chart, labs and discussed the  procedure including the risks, benefits and alternatives for the proposed anesthesia with the patient or authorized representative who has indicated his/her understanding and acceptance.   Dental advisory given  Plan Discussed with: CRNA, Anesthesiologist and Surgeon  Anesthesia Plan Comments:         Anesthesia Quick Evaluation

## 2014-04-23 NOTE — Progress Notes (Signed)
PT Cancellation Note  Patient Details Name: Barry Haley MRN: [id number] DOB: 1946-06-03   Cancelled Treatment:    Reason Eval/Treat Not Completed: Patient at procedure or test/unavailable   Shani Fitch, Thornton Papas 04/23/2014, 11:37 AM

## 2014-04-23 NOTE — Progress Notes (Signed)
Occupational Therapy Evaluation Patient Details Name: Lethaniel Poche MRN: [id number] DOB: 02-Oct-1945 Today's Date: 04/23/2014    History of Present Illness 68 y.o. male history hypertension, hyperlipidemia, diabetes mellitus and hemorrhagic stroke with aneurysm rupture, presenting with visual changes which started 10 days ago, as well as progressively worsening gait and increasing confusion. Patient was last known well 04/11/2014. He was subsequently seen by ophthalmology and was found to have left visual field defect and likely right occipital stroke. Patient stroke workup is in progress. NIH score 06 upon admission. Awaiting MRI.    Clinical Impression   PTA, pt independent with ADL and mobility. Pt presents with significant deficits, decreasing independence with ADL and mobility. Pt appears to demonstrate L homonymous hemianopsia during VF testing; decreased visual attention; perceptual deficits, decreased proprioception LUE with mild L inattention and apparent cognitive deficits. 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 Pt will benefit from rehab at SNF to return to PLOF. Pt will benefit from skilled OT services to facilitate D/C to CIR due to below deficits.    Follow Up Recommendations  CIR;Supervision/Assistance - 24 hour    Equipment Recommendations  Tub/shower seat    Recommendations for Other Services Rehab consult     Precautions / Restrictions Precautions Precautions: Fall Restrictions Weight Bearing Restrictions: No      Mobility Bed Mobility Overal bed mobility: Needs Assistance Bed Mobility: Supine to Sit     Supine to sit: Supervision;HOB elevated        Transfers Overall transfer level: Needs assistance Equipment used: 1 person hand held assist Transfers: Sit to/from Stand Sit to Stand: Min assist Stand pivot transfers: Min assist       General transfer comment: unsteady upon standing    Balance Overall balance assessment: Needs  assistance Sitting-balance support: Feet supported Sitting balance-Leahy Scale: Good     Standing balance support: During functional activity;Bilateral upper extremity supported Standing balance-Leahy Scale: Poor                              ADL Overall ADL's : Needs assistance/impaired Eating/Feeding: NPO   Grooming: Set up;Standing;Min guard Grooming Details (indicate cue type and reason): vc to locate paper towels on L and to turn off running water. poor midline orientation to sink                 Toilet Transfer: Minimal assistance;Ambulation;Regular Glass blower/designer Details (indicate cue type and reason): poor orientation/use of space. unaware of urinating on floor rather than in toilet Toileting- Clothing Manipulation and Hygiene: Minimal assistance;Sit to/from stand       Functional mobility during ADLs: Minimal assistance;Cueing for safety;Cueing for sequencing (poor use of space. running into objects on L)       Vision Eye Alignment: Within Functional Limits Alignment/Gaze Preference: Within Defined Limits Ocular Range of Motion: Within Functional Limits Tracking/Visual Pursuits: Decreased smoothness of horizontal tracking;Decreased smoothness of vertical tracking;Impaired - to be further tested in functional context Saccades: Additional eye shifts occurred during testing Convergence: Within functional limits     Additional Comments: L visaul inattention   Perception Perception Perception Tested?: Yes Perception Deficits: Spatial orientation;Topographical orientation;Inattention/neglect Inattention/Neglect: Impaired- to be further tested in functional context (L inattention) Spatial deficits: Poor use of space. Poor orientation of self in environment. Pt required mod vc to find recliner chair in room after going to bathroom. Chair on [place] tested?: Within functional limits  Pertinent Vitals/Pain Pain Assessment:  No/denies pain     Hand Dominance Right   Extremity/Trunk Assessment Upper Extremity Assessment Upper Extremity Assessment: Overall WFL for tasks assessed;LUE deficits/detail LUE Deficits / Details: strength WFL. decreased proprioception LUE Sensation: decreased proprioception LUE Coordination:  (Difficulty with BUE integrated use)   Lower Extremity Assessment Lower Extremity Assessment: Defer to PT evaluation   Cervical / Trunk Assessment Cervical / Trunk Assessment: Normal   Communication Communication Communication: Expressive difficulties (word finding difficulties at times)   Cognition Arousal/Alertness: Awake/alert Behavior During Therapy: WFL for tasks assessed/performed Overall Cognitive Status: Impaired/Different from baseline Area of Impairment: Attention;Memory;Awareness;Problem solving   Current Attention Level: Selective Memory: Decreased short-term memory Following Commands: Follows one step commands consistently Safety/Judgement: Decreased awareness of safety;Decreased awareness of deficits Awareness: Emergent Problem Solving: Slow processing;Decreased initiation;Difficulty sequencing General Comments: Confusion yesterday most likely associated with sedating meds from TEE. alert and oriented this am. slow processing. Difficulty with mental flexibillity/problem solving.Pt states he got lost at times in his own house.   General Comments       Exercises       Shoulder Instructions      Home Living Family/patient expects to be discharged to:: Inpatient rehab                                        Prior Functioning/Environment Level of Independence: Independent             OT Diagnosis: Generalized weakness;Disturbance of vision;Cognitive deficits   OT Problem List: Decreased strength;Decreased activity tolerance;Impaired balance (sitting and/or standing);Impaired vision/perception;Decreased cognition;Decreased safety  awareness;Impaired sensation;Obesity   OT Treatment/Interventions: Self-care/ADL training;Therapeutic exercise;Neuromuscular education;DME and/or AE instruction;Therapeutic activities;Cognitive remediation/compensation;Visual/perceptual remediation/compensation;Patient/family education;Balance training    OT Goals(Current goals can be found in the care plan section) Acute Rehab OT Goals Patient Stated Goal: to be able to take care of myself OT Goal Formulation: With patient Time For Goal Achievement: 05/07/14 Potential to Achieve Goals: Good  OT Frequency: Min 3X/week   Barriers to D/C:            Co-evaluation              End of Session Equipment Utilized During Treatment: Gait belt Nurse Communication: Mobility status  Activity Tolerance: Patient tolerated treatment well Patient left: in chair;with call bell/phone within reach;with family/visitor present   Time: [id number] OT Time Calculation (min): 47 min Charges:  OT General Charges $OT Visit: 1 Procedure OT Evaluation $Initial OT Evaluation Tier I: 1 Procedure OT Treatments $Self Care/Home Management : 8-22 mins $Therapeutic Activity: 8-22 mins G-Codes:    Deena Shaub,HILLARY 2014-05-06, 10:11 AM   Maurie Boettcher, OTR/L  [phone] 2014-05-06

## 2014-04-23 NOTE — Progress Notes (Signed)
[place] TEAM 1 - Stepdown/ICU TEAM Progress Note  Barry Haley [id number] DOB: 1945/11/08 DOA: 04/21/2014 PCP: [name], MD  Admit HPI / Brief Narrative: 68 year old M Hx tricuspid and mitral valve repair (tissue), HTN, hyperlipidemia, diabetes mellitus, anxiety, stroke (2010 per patient), and multiple cerebral aneurysms (last evaluated by MRI on 01/26/2012)  Who presented with one-week history of "floaters" in his visual field. Apparently, the patient went to see his ophthalmologist and was diagnosed with a left hemianopsia and likely right occipital stroke. The patient was sent to Neurology. He saw Dr. Erlinda Hong on 8/14 and outpt stroke workup was started. Unfortunately, the patient experienced increasing confusion that worsened since 04/16/2014. He was noted to have hemoglobin A1c of 13.7 on 04/16/2014. He was instructed to go to his primary care physician whom he saw on the day of admission. His CBG was over 450 in the office, and he was sent to the emergency department. In addition, the patient has had gait instability with mechanical fall 2 days prior to this admission.   In emergency department, the patient had serum glucose 480 with an anion gap of 15. He was given 1 L normal saline. MRI was ordered, but he was not able to tolerate it due to phobia and agitation. Urinalysis was negative for pyuria. Urine drug screen and alcohol were negative. EKG showed sinus rhythm with ST depression V1 to V3.   Since admission, the patient's DKA has been corrected.  He has undergone a TEE, and carotid dopplers.  Today he underwent an MRI/MRA w/ propofol sedation.  Clinically he appears to be slowly stabilizing.  His neuro/CVA eval is being directed by Neurology.    HPI/Subjective: Pt is conversant and pleasant, though subtly confused.  Denies any pain, or sob.  States his vision is better, though his wife at bedside indicates that it does not actually appear to have improved.     Assessment/Plan:  DKA - uncontrolled DM2 Pt has been transitioned off insulin gtt - A1c 13.7 - CBG reasonably controlled at this time   Left hemianopsia - suspected R occipital CVA -Neurology following -MRI brain pending (with propofol) -Carotid ultrasound w/o signif stenosis  -TEE w/o evidence of vegetation  -LDL 9   Newly diagnosed acute systolic CHF  -EF [id number] on TEE 8/20 - no evidence of volume overload at this time - follow clinically - watch weights and Is/Os - will need to consider eval for ishcemic etiology once clinically stable otherwise (renal failure would presently prohibit cath)  Hx of endocarditis s/p MV and TV repair  - blood cultures NGTD  - no sign of active infection  - TEE w/o evidence of vegetation  Abnormal EKG (T wave inversions V1 + V2) -no chest pain  -Troponins x3 negative    Hypertension  -reasonably controlled in setting of possible acute/subacute CVA - follow w/o change today   Acute on Chronic renal failure (CKD2)  -due to volume depletion - baseline creatinine around 1.2 (value from 01/2010) - hold all nephrotoxic medication and cont to hydrate   Depression/anxiety  -Continue Celexa and clonazepam   Tobacco abuse  Obesity - Body mass index is 32.76 kg/(m^2).  Code Status: FULL Family Communication:  Spoke w/ wife at bedside  Disposition Plan:  Stable for transfer to tele bed on neuro floor - eventual CIR once medically stable   Consultants: Neurology   Antibiotics: NA  DVT prophylaxis: Lovenox  Objective: VITAL SIGNS: Temp: 98.1 F (36.7 C) (08/21 1545) Temp src: Oral (  08/21 0700) BP: 158/71 mmHg (08/21 1545) Pulse Rate: 67 (08/21 1545)  Intake/Output Summary (Last 24 hours) at 04/23/14 1650 Last data filed at 04/22/14 2330  Gross per 24 hour  Intake      0 ml  Output    250 ml  Net   -250 ml   Exam: General: No acute respiratory distress Lungs: Clear to auscultation bilaterally without wheezes or  crackles Cardiovascular: Regular rate and rhythm without murmur gallop or rub normal S1 and S2 Abdomen: Nontender, nondistended, soft, bowel sounds positive, no rebound, no ascites, no appreciable mass Extremities: No significant cyanosis, clubbing, or edema bilateral lower extremities Neurologic; pupils equal round reactive to light and accommodation, cranial nerves II through XII intact, mild slowing of speech persists, extremity strength 5/5, sensation intact  Data Reviewed: Basic Metabolic Panel:  Recent Labs Lab 04/21/14 1951 04/21/14 2220 04/22/14 0040 04/22/14 0535 04/23/14 0340  NA 131* 131* 133* 137 134*  K 4.8 4.5 3.8 4.4 4.3  CL 95* 97 100 104 101  CO2 23 24 22 22 23   GLUCOSE 402* 338* 161* 161* 126*  BUN 47* 46* 44* 41* 37*  CREATININE 2.10* 2.02* 2.04* 1.83* 1.87*  CALCIUM 8.5 8.7 8.7 8.6 8.8  MG  --   --   --   --  2.2   Liver Function Tests:  Recent Labs Lab 04/21/14 1332 04/23/14 0340  AST 13 15  ALT 13 15  ALKPHOS 134* 113  BILITOT 0.5 0.4  PROT 6.6 5.9*  ALBUMIN 2.9* 2.6*   CBC:  Recent Labs Lab 04/21/14 1332 04/21/14 1951 04/23/14 0340  WBC 8.2 6.8 6.7  NEUTROABS 6.5  --  4.5  HGB 12.1* 11.6* 11.7*  HCT 35.5* 34.8* 36.5*  MCV 71.1* 73.3* 75.6*  PLT 145* 140* 136*   Cardiac Enzymes:  Recent Labs Lab 04/21/14 1951 04/22/14 0040 04/22/14 0835  TROPONINI <0.30 <0.30 <0.30   CBG:  Recent Labs Lab 04/22/14 1946 04/22/14 2313 04/23/14 0317 04/23/14 0758 04/23/14 1107  GLUCAP 169* 155* 114* 150* 187*    Recent Results (from the past 240 hour(s))  CULTURE, BLOOD (SINGLE)     Status: None   Collection Time    04/16/14  4:04 PM      Result Value Ref Range Status   BLOOD CULTURE, ROUTINE Final report   Final   RESULT 1 Comment   Final   Comment: No aerobic or anaerobic growth in five days.  CULTURE, BLOOD (SINGLE)     Status: None   Collection Time    04/16/14  4:04 PM      Result Value Ref Range Status   BLOOD CULTURE,  ROUTINE Final report   Final   RESULT 1 Comment   Final   Comment: No aerobic or anaerobic growth in five days.  MRSA PCR SCREENING     Status: None   Collection Time    04/21/14  7:10 PM      Result Value Ref Range Status   MRSA by PCR NEGATIVE  NEGATIVE Final   Comment:            The GeneXpert MRSA Assay (FDA     approved for NASAL specimens     only), is one component of a     comprehensive MRSA colonization     surveillance program. It is not     intended to diagnose MRSA     infection nor to guide or     monitor treatment for  MRSA infections.    Studies:  Recent x-ray studies have been reviewed in detail by the Attending Physician  Scheduled Meds:  Scheduled Meds: .  stroke: mapping our early stages of recovery book   Does not apply Once  . amLODipine  2.5 mg Oral Daily  . antiseptic oral rinse  7 mL Mouth Rinse BID  . aspirin  300 mg Rectal Daily   Or  . aspirin  325 mg Oral Daily  . atorvastatin  10 mg Oral QHS  . citalopram  40 mg Oral Daily  . enoxaparin (LOVENOX) injection  40 mg Subcutaneous Q24H  . fluticasone  2 spray Each Nare Daily  . insulin aspart  0-15 Units Subcutaneous 6 times per day  . insulin glargine  8 Units Subcutaneous Daily  . metoprolol succinate  100 mg Oral Daily  . pantoprazole  80 mg Oral Q1200  . tamsulosin  0.4 mg Oral BID  . vitamin B-12  1,000 mcg Oral Daily   Time spent on care of this patient: 35 mins  Cherene Altes, MD [facility] For Consults/Admissions - Flow Manager - [phone] Office  [phone] Pager [phone]  On-Call/Text Page:      [email or web address]      password [facility]  04/23/2014, 4:50 PM   LOS: 2 days

## 2014-04-23 NOTE — Telephone Encounter (Signed)
Janssen Puls @ [phone] x [phone], requesting a return call from [place], [place].

## 2014-04-23 NOTE — Progress Notes (Addendum)
STROKE TEAM PROGRESS NOTE   HISTORY Bejamin Haley is an 68 y.o. male history hypertension, hyperlipidemia, diabetes mellitus and hemorrhagic stroke with aneurysm rupture, presenting with visual changes which started 10 days ago, as well as progressively worsening gait and increasing confusion. Patient was last known well 04/11/2014. He was subsequently seen by ophthalmology and was found to have left visual field defect and likely right occipital stroke. Patient stroke workup is in progress. Patient was scheduled for an MRI study, which was attempted today but unsuccessful because of apparent claustrophobia. CT scan of the head his been ordered and is pending. TEE was also ordered, as the patient has a history of valve replacement. NIH stroke score was 6. Patient was not administered TPA secondary to Beyond time under for treatment consideration. He was admitted for further evaluation and treatment.  I saw this pt in clinic just about one week ago. 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 In 2014, he again developed sepsis with bacteremia and TEE showed endocarditis. He had MV and TV repair in [facility]. Recently developed CHF and followed up with Dr. Bridgett Larsson in [facility] cardiology. He started to have left hemianopia two weeks ago and concerns for a new stroke. I have ordered him MRI and TEE and EEG and blood tests last week. His MRI not done,TEE scheduled today, EEG negative for seizure and A1C was super high at 13.7. He recently developed generalized weakness, lethargy and was admitted yesterday due to AKI and hyperglycemia.  SUBJECTIVE (INTERVAL HISTORY) His wife is at the bedside.  Overall he feels his condition is gradually improving. He is on insulin drip and BG was currently under control. He stated that he is no more seeing objects or figures in his left visual  field. On exam, his left visual field improved from yesterday. Will go for MRI today under anesthesia.    OBJECTIVE  Dg Chest 2 View 04/22/2014   IMPRESSION: No active cardiopulmonary disease.     Ct Head Wo Contrast 04/21/2014  IMPRESSION: 1. No acute intracranial abnormality. 2. Atrophy and chronic microvascular white matter ischemic changes.    CUS 01/2013 This is an abnormal carotid duplex ultrasound with color flow imaging. .  There is 50-75% stenosis of the left ICA by Doppler criteria, although this  is likely at the low end of the range. No severe luminal narrowing noted,  although there is moderate plaque. The Vertebrals demonstrate foward flow  bilaterally . Insonation at multiple depths was attempted of the  [facility] and Ophthalmics. This is an abnormal Transcranial  Doppler Ultrasound examination. The right ACA velocity is slightly elevated,  but does not meet criteria for stenosis and most likely reflects hyperemia or  collateral flow. The resistance is increased in multiple vessels, which may  point to diffuse atherosclerosis or distal small vessel disease. However,  there is no evidence of stenosis, vasospasm, or flow diversion. There was no  previous study available for direct comparison.  CUS 04/2014 - 1-39% internal carotid artery stenosis bilaterally. Vertebral arteries are patent with antegrade flow.  TEE - Impression: No vegetations seen EF 35-40% + PFO ( markedly positive)  He is bradycardic and LV function is moderately reduced.  I have DCd his Diltiazem for now..  Transthoracic echo to be done.   TTE - Left ventricle: Systolic function was moderately to severely reduced. The estimated ejection fraction was in the  range of 30% to 35%. - Aortic valve: No evidence of vegetation. - Mitral valve: No evidence of vegetation. - Tricuspid valve: No evidence of vegetation.  EEG - Summary This is a severely abnormal EEG due to the presence of  severe background slowing indicative of bihemispheric dysfunction which is a nonspecific finding seen in a variety of degenerative, toxic, metabolic and ischemic etiologies. No definite epileptiform activity is noted.  MRI pending  BMET    Component Value Date/Time   NA 134* 04/23/2014 0340   K 4.3 04/23/2014 0340   CL 101 04/23/2014 0340   CO2 23 04/23/2014 0340   GLUCOSE 126* 04/23/2014 0340   BUN 37* 04/23/2014 0340   CREATININE 1.87* 04/23/2014 0340   CALCIUM 8.8 04/23/2014 0340   GFRNONAA 35* 04/23/2014 0340   GFRAA 41* 04/23/2014 0340   CBC    Component Value Date/Time   WBC 6.7 04/23/2014 0340   RBC 4.83 04/23/2014 0340   RBC 2.79* 11/06/2008 1753   HGB 11.7* 04/23/2014 0340   HCT 36.5* 04/23/2014 0340   PLT 136* 04/23/2014 0340   MCV 75.6* 04/23/2014 0340   MCH 24.2* 04/23/2014 0340   MCHC 32.1 04/23/2014 0340   RDW 16.6* 04/23/2014 0340   LYMPHSABS 1.2 04/23/2014 0340   MONOABS 0.7 04/23/2014 0340   EOSABS 0.3 04/23/2014 0340   BASOSABS 0.0 04/23/2014 0340   Lipid Panel     Component Value Date/Time   CHOL  Value: 112        ATP III CLASSIFICATION:  <200     mg/dL   Desirable  200-239  mg/dL   Borderline High  >=240    mg/dL   High        01/04/2010 0700   TRIG 196* 04/16/2014 1604   HDL 25* 04/16/2014 1604   HDL 18* 01/04/2010 0700   CHOLHDL 5.7* 04/16/2014 1604   CHOLHDL 6.2 01/04/2010 0700   VLDL 22 01/04/2010 0700   LDLCALC 78 04/16/2014 1604   LDLCALC  Value: 72        Total Cholesterol/HDL:CHD Risk Coronary Heart Disease Risk Table                     Men   Women  1/2 Average Risk   3.4   3.3  Average Risk       5.0   4.4  2 X Average Risk   9.6   7.1  3 X Average Risk  23.4   11.0        Use the calculated Patient Ratio above and the CHD Risk Table to determine the patient's CHD Risk.        ATP III CLASSIFICATION (LDL):  <100     mg/dL   Optimal  100-129  mg/dL   Near or Above                    Optimal  130-159  mg/dL   Borderline  160-189  mg/dL   High  >190     mg/dL   Very High  01/04/2010 0700    Lab Results  Component Value Date   HGBA1C 13.7* 04/16/2014    PHYSICAL EXAM Physical exam  Temp:  [97.5 F (36.4 C)-98.2 F (36.8 C)] 98.1 F (36.7 C) (08/21 1545) Pulse Rate:  [45-69] 67 (08/21 1545) Resp:  [13-24] 14 (08/21 1545) BP: (136-164)/(60-77) 158/71 mmHg (08/21 1545) SpO2:  [99 %-100 %] 100 % (08/21 1545)  General - Well  nourished, well developed, in no apparent distress.  Ophthalmologic - not able to keep eye still, not able to see fundus.  Cardiovascular - Regular rate and rhythm with no murmur with intermittent premature beats.  Mental Status -  Level of arousal and orientation to place, and person were intact, but not to time. Language including expression, naming, repetition, comprehension was assessed and found intact.  Cranial Nerves II - XII - II - Visual field testing showed mainly left upper quadranopia, improved from yesterday. He has psychological impersistence (can not keep looking forward, kept looking at fingers on testing). III, IV, VI - Extraocular movements intact. V - Facial sensation intact bilaterally. VII - Facial movement intact bilaterally. VIII - Hearing & vestibular intact bilaterally. X - Palate elevates symmetrically. XI - Chin turning & shoulder shrug intact bilaterally. XII - Tongue protrusion intact.  Motor Strength - The patient's strength was normal in all extremities and pronator drift was absent.  Bulk was normal and fasciculations were absent.   Motor Tone - Muscle tone was assessed at the neck and appendages and was normal.  Reflexes - The patient's reflexes were 1+ in all extremities and he had no pathological reflexes.  Sensory - Light touch, temperature/pinprick were assessed and were normal.    Coordination - The patient had normal movements in the hands and feet with no ataxia or dysmetria.  Tremor was absent. However, he has mild asterixis on both hands.   Gait and Station - not  tested.  ASSESSMENT/PLAN  Mr. Sheri Hottinger is a 68 y.o. 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   He was admitted this time for AKI, hyperglycemia and poorly controlled diabetes, recent A1C 13.7. He will need to have inpt MRI and MRA without contrast, TEE. He is on insulin drip and needs to have better control of DM as outpt. We will have Dr. Kathyrn Sheriff to see him regarding the aneurysm evaluation and management.   His EEG was diffuse slowing and no seizure activity. 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 Now seems resolved. Also his visual field improved today.  His etiology for previous stroke are most likely due to endocarditis. 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 However, if confirmed, his new stroke is most likely due to his poorly controlled stroke risk factors. Of course, recurrent endocarditis needs to be rule out. TEE will be done this am and blood culture so far NGTD. cardioembolic stroke still in the ddx but no source found yet this time.   Stroke:       aspirin 325 mg orally every day prior to admission, continue for now  MRI pending  MRA pending  2D Echo EF 30-35%   Carotid negative this time, but abnormal in 01/2013 with 50-75% stenosis of the left ICA  TEE no vegetation  LDL 78, goal of LDL <70, continue lipitor  HgbA1c 13.7, on insulin drip now  lovenox subq for VTE prophylaxis  Therapy needs:  CIR, needs OT eval for  hemianopia  Risk factor management/education  Patient counseled to be compliant with his antithrombotic medications  Hypertension   Home meds: amlodipine. Resumed in hospital  BP 120-160/40-60 past 24h  SBP goal 130/80  Stable  Patient counseled to be compliant with his blood pressure medications  Hyperlipidemia  LDL 78, continue lipitor  Patient on lipitor at home, continued in hospital (formulary)  LDL goal <70 for diabetics  Diabetes  HgbA1c 13.7   Uncontrolled  On insulin drip  Goal < 7.0  educate patient about lifestyle changes for diabetes prevention  Needs better control as outpt  Cerebral aneurysms - will have Dr. Kathyrn Sheriff to take a look  Hx of endocarditis - s/p MV and TV repair - blood culture so far NGTD - no sign of active infection - TEE no vegetation  Other Stroke Risk Factors Cigarette smoker, advised to stop smoking   Obesity, Body mass index is 32.76 kg/(m^2).    Hx stroke/TIA   coronary artery disease - CHF - OSA  Hospital day # 2  This is a complicated pt with complex PMH and active medical conditions. I had long discussion with pt and his wife regarding current diagnosis, upcoming tests, and treatment plan. They understood pt current situation, and all questions are answered.  Rosalin Hawking, MD PhD Stroke Neurology 04/23/2014 6:25 PM  To contact Stroke Continuity provider, please refer to [email or web address] After hours, contact General Neurology

## 2014-04-24 DIAGNOSIS — N182 Chronic kidney disease, stage 2 (mild): Secondary | ICD-10-CM

## 2014-04-24 DIAGNOSIS — Z8679 Personal history of other diseases of the circulatory system: Secondary | ICD-10-CM

## 2014-04-24 LAB — BASIC METABOLIC PANEL
ANION GAP: 9 (ref 5–15)
BUN: 28 mg/dL — ABNORMAL HIGH (ref 6–23)
CHLORIDE: 104 meq/L (ref 96–112)
CO2: 24 meq/L (ref 19–32)
CREATININE: 1.52 mg/dL — AB (ref 0.50–1.35)
Calcium: 8.8 mg/dL (ref 8.4–10.5)
GFR calc Af Amer: 53 mL/min — ABNORMAL LOW (ref >90)
GFR calc non Af Amer: 45 mL/min — ABNORMAL LOW (ref >90)
Glucose, Bld: 187 mg/dL — ABNORMAL HIGH (ref 70–99)
Potassium: 4.8 mEq/L (ref 3.7–5.3)
SODIUM: 137 meq/L (ref 137–147)

## 2014-04-24 LAB — RETICULOCYTES
RBC.: 4.86 MIL/uL (ref 4.22–5.81)
RETIC COUNT ABSOLUTE: 87.5 10*3/uL (ref 19.0–186.0)
RETIC CT PCT: 1.8 % (ref 0.4–3.1)

## 2014-04-24 LAB — IRON AND TIBC
IRON: 26 ug/dL — AB (ref 42–135)
Saturation Ratios: 11 % — ABNORMAL LOW (ref 20–55)
TIBC: 243 ug/dL (ref 215–435)
UIBC: 217 ug/dL (ref 125–400)

## 2014-04-24 LAB — GLUCOSE, CAPILLARY
GLUCOSE-CAPILLARY: 188 mg/dL — AB (ref 70–99)
GLUCOSE-CAPILLARY: 147 mg/dL — AB (ref 70–99)
GLUCOSE-CAPILLARY: 219 mg/dL — AB (ref 70–99)
Glucose-Capillary: 190 mg/dL — ABNORMAL HIGH (ref 70–99)
Glucose-Capillary: 244 mg/dL — ABNORMAL HIGH (ref 70–99)
Glucose-Capillary: 250 mg/dL — ABNORMAL HIGH (ref 70–99)

## 2014-04-24 LAB — VITAMIN B12: Vitamin B-12: 1640 pg/mL — ABNORMAL HIGH (ref 211–911)

## 2014-04-24 LAB — FOLATE: Folate: 16.8 ng/mL

## 2014-04-24 LAB — FERRITIN: FERRITIN: 23 ng/mL (ref 22–322)

## 2014-04-24 MED ORDER — CARVEDILOL 3.125 MG PO TABS: 3.1250 mg | ORAL_TABLET | Freq: Two times a day (BID) | ORAL | Status: DC

## 2014-04-24 MED ORDER — INSULIN ASPART 100 UNIT/ML ~~LOC~~ SOLN
0.0000 [IU] | Freq: Three times a day (TID) | SUBCUTANEOUS | Status: DC
  Administered 2014-04-25: 3 [IU] via SUBCUTANEOUS
  Administered 2014-04-25: 7 [IU] via SUBCUTANEOUS
  Administered 2014-04-25 – 2014-04-27 (×6): 3 [IU] via SUBCUTANEOUS
  Administered 2014-04-27: 5 [IU] via SUBCUTANEOUS

## 2014-04-24 MED ORDER — LINAGLIPTIN 5 MG PO TABS
5.0000 mg | ORAL_TABLET | Freq: Every day | ORAL | Status: DC
  Administered 2014-04-24 – 2014-04-27 (×4): 5 mg via ORAL
  Filled 2014-04-24 (×4): qty 1

## 2014-04-24 MED ORDER — GLIMEPIRIDE 1 MG PO TABS
0.5000 mg | ORAL_TABLET | Freq: Every day | ORAL | Status: DC
  Administered 2014-04-25: 0.5 mg via ORAL
  Filled 2014-04-24 (×2): qty 0.5

## 2014-04-24 MED ORDER — CARVEDILOL 6.25 MG PO TABS
6.2500 mg | ORAL_TABLET | Freq: Two times a day (BID) | ORAL | Status: DC
  Administered 2014-04-24 – 2014-04-27 (×7): 6.25 mg via ORAL
  Filled 2014-04-24 (×7): qty 1

## 2014-04-24 MED ORDER — INSULIN ASPART 100 UNIT/ML ~~LOC~~ SOLN
4.0000 [IU] | Freq: Once | SUBCUTANEOUS | Status: AC
  Administered 2014-04-24: 4 [IU] via SUBCUTANEOUS

## 2014-04-24 MED ORDER — SODIUM CHLORIDE 0.9 % IV SOLN
INTRAVENOUS | Status: DC
  Administered 2014-04-24: 18:00:00 via INTRAVENOUS

## 2014-04-24 NOTE — Evaluation (Signed)
Speech Language Pathology Evaluation Patient Details Name: Barry Haley MRN: [id number] DOB: [birth year] Today's Date: 04/24/2014 Time: [id number] SLP Time Calculation (min): 18 min  Problem List:  Patient Active Problem List   Diagnosis Date Noted  . DKA (diabetic ketoacidoses) 04/21/2014  . Left homonymous hemianopsia 04/21/2014  . Aneurysm, cerebral, nonruptured 04/16/2014  . CKD (chronic kidney disease) stage 2, GFR 60-89 ml/min 04/06/2014  . DM (diabetes mellitus), type 2 with renal complications [id number]  . Dyspnea 04/06/2014  . OSA (obstructive sleep apnea) 04/02/2014  . PVCs (premature ventricular contractions) 04/02/2014  . S/P mitral valve repair 03/24/2014  . S/P tricuspid valve repair 03/24/2014  . Chronic diastolic heart failure [id number]  . History of bacterial endocarditis 01/22/2013  . Limb weakness 01/22/2013  . Cerebral embolism with cerebral infarction 01/22/2013  . DYSLIPIDEMIA 07/04/2009  . BELL'S PALSY, RIGHT 07/04/2009  . HYPERTENSION 07/04/2009  . CLOSTRIDIUM DIFFICILE COLITIS, HX OF 07/04/2009  . RENAL FAILURE, ACUTE, HX OF 07/04/2009  . MIGRAINES, HX OF 07/04/2009   Past Medical History:  Past Medical History  Diagnosis Date  . Hypertension   . Hyperlipidemia   . Diabetes   . Cerebral aneurysm, nonruptured   . Epistaxis   . Anxiety state, unspecified   . Unspecified cerebral artery occlusion with cerebral infarction   . Other shock without mention of trauma   . Personal history of other disorder of urinary system   . Personal history of other diseases of digestive system   . Hx of migraines   . Sleep apnea   . Meningitis 2010   Past Surgical History:  Past Surgical History  Procedure Laterality Date  . Appendectomy      child  . 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 Mitral valve reconstruction with cardiopulmonary bypass  01/27/2013  . Tee without cardioversion N/A 04/22/2014    Procedure: TRANSESOPHAGEAL  ECHOCARDIOGRAM (TEE);  Surgeon: Thayer Headings, MD;  Location: [facility] ENDOSCOPY;  Service: Cardiovascular;  Laterality: N/A;   HPI:  68 y.o. male with multiple risk factors for stroke as well as history of previous hemorrhagic stroke for ruptured aneurysm, presenting with probable subacute right occipital infarction with left homonymous hemianopsia, as well as increasing confusion and gait instability. SLE ordered due to confusion on admit.   Assessment / Plan / Recommendation Clinical Impression  The Montreal Cognitive Assessment - Basic (MoCA-B) was administered. Pt scored 20/30 (n=>25/30), indicating presence of cognitive impairment. Difficulty noted on the following subtests: executive function, immediate and delayed recall, fluency, orientation, calculation, and selective attention. Pt reported he feels he would have done better prior to admit. Recommend outpatient or home health ST after DC for cognitive retraining and education.     SLP Assessment  All further Speech Lanaguage Pathology  needs can be addressed in the next venue of care    Follow Up Recommendations  Outpatient SLP;Home health SLP    Frequency and Duration   per HH/OP ST     Pertinent Vitals/Pain Pain Assessment: No/denies pain   SLP Goals   n/a  SLP Evaluation Prior Functioning  Cognitive/Linguistic Baseline: Within functional limits Type of Home: House  Lives With: Spouse Available Help at Discharge: Family;Available 24 hours/day Education: high school Vocation: Retired   Associate Professor  Overall Cognitive Status: Impaired/Different from baseline Arousal/Alertness: Awake/alert Orientation Level: Oriented to person;Oriented to place;Oriented to situation;Disoriented to time Attention: Focused;Sustained;Selective Focused Attention: Appears intact Sustained Attention: Appears intact Selective Attention: Impaired Selective Attention Impairment: Functional basic;Verbal basic Memory: Impaired Memory  Impairment:  Retrieval deficit;Decreased recall of new information;Decreased short term memory Decreased Short Term Memory: Verbal basic Awareness: Appears intact Problem Solving: Appears intact Executive Function: Reasoning Reasoning: Impaired Reasoning Impairment: Verbal basic    Comprehension  Auditory Comprehension Overall Auditory Comprehension: Appears within functional limits for tasks assessed    Expression Expression Primary Mode of Expression: Verbal Verbal Expression Overall Verbal Expression: Appears within functional limits for tasks assessed Written Expression Dominant Hand: Right   Oral / Motor Oral Motor/Sensory Function Overall Oral Motor/Sensory Function: Appears within functional limits for tasks assessed Motor Speech Overall Motor Speech: Appears within functional limits for tasks assessed   GO   Celia B. Quentin Ore [facility], CCC-SLP [id number]  Shonna Chock 04/24/2014, 9:50 AM

## 2014-04-24 NOTE — Progress Notes (Signed)
Barry Haley TEAM 1 - Stepdown/ICU TEAM Progress Note  Barry Haley [id number] DOB: 1946-05-01 DOA: 04/21/2014 PCP: [name], MD  Admit HPI / Brief Narrative: 68 year old M Hx tricuspid and mitral valve repair (tissue), HTN, hyperlipidemia, diabetes mellitus, anxiety, stroke (2010 per patient), and multiple cerebral aneurysms (last evaluated by MRI on 01/26/2012) who presented with one-week history of "floaters" in his visual field. Apparently, the patient went to see his ophthalmologist and was diagnosed with a left hemianopsia and likely right occipital stroke. The patient was sent to Neurology. He saw Dr. Erlinda Hong on 8/14 and outpt stroke workup was started. Unfortunately, the patient experienced increasing confusion that worsened since 04/16/2014. He was noted to have hemoglobin A1c of 13.7 on 04/16/2014. He was instructed to go to his primary care physician whom he saw on the day of admission. His CBG was over 450 in the office, and he was sent to the emergency department. In addition, the patient has had gait instability with mechanical fall 2 days prior to this admission.   In emergency department, the patient had serum glucose 480 with an anion gap of 15. He was given 1 L normal saline. MRI was ordered, but he was not able to tolerate it due to phobia and agitation. Urinalysis was negative for pyuria. Urine drug screen and alcohol were negative. EKG showed sinus rhythm with ST depression V1 to V3.   Since admission, the patient's DKA has been corrected.  He has undergone a TEE, and carotid dopplers.  He underwent an MRI/MRA w/ propofol sedation.  Clinically he appears to be slowly stabilizing.  His neuro/CVA eval is being directed by Neurology.    HPI/Subjective: Pt has no new complaints today.  He is anxious to go home.  Denies cp, sob, n/v, or abdom pain. Reports that his visual problems are "basically all cleared up."   Assessment/Plan:  DKA - uncontrolled DM2 Pt has been  transitioned off insulin gtt - A1c 13.7 - CBG fluctuating - resume home oral meds and continue SSI to determine if initiation of home insulin will be required     Left hemianopsia - suspected R occipital CVA -Neurology following and suggests no further changes in tx plan at this time  -MRI negative for acute infarct -Carotid ultrasound w/o signif stenosis  -TEE w/o evidence of vegetation  -LDL 78   32mm aneurysm anterior communicating artery -noted via MRI/A - as per Neurology recs, to be followed up per Neurosurgery  Newly diagnosed acute systolic CHF  -EF [id number] on TEE 8/20 - no evidence of volume overload at this time - follow clinically - watch weights and Is/Os - will need to consider eval for ishcemic etiology once clinically stable otherwise (renal failure would presently prohibit cath) - pt reportedly was seen as an outpt for what sounds like diastolic CHF   Hx of endocarditis s/p MV and TV repair  - blood cultures NGTD  - no sign of active infection  - TEE w/o evidence of vegetation  Abnormal EKG (T wave inversions V1 + V2) -no chest pain  -Troponins x3 negative    Hypertension  -in absence of acute CVA, will attempt more strict control - adjust meds and follow   Acute on Chronic renal failure (CKD2)  -due to volume depletion - baseline creatinine around 1.2 (value from 01/2010) - hold all nephrotoxic medication and cont to hydrate   Depression/anxiety  -Continue Celexa and clonazepam   Tobacco abuse  Obesity - Body mass index is 32.76  kg/(m^2).  Code Status: FULL Family Communication:  Spoke w/ wife at bedside  Disposition Plan:  Stable for transfer to tele bed on neuro floor - eventual CIR once medically stable   Consultants: Neurology   Antibiotics: NA  DVT prophylaxis: Lovenox  Objective: VITAL SIGNS: Temp: 98.1 F (36.7 C) (08/22 1453) Temp src: Oral (08/22 1453) BP: 141/54 mmHg (08/22 1453) Pulse Rate: 70 (08/22 1453)  Intake/Output Summary (Last  24 hours) at 04/24/14 1644 Last data filed at 04/24/14 0900  Gross per 24 hour  Intake      0 ml  Output    600 ml  Net   -600 ml   Exam: General: No acute respiratory distress Lungs: Clear to auscultation bilaterally without wheezes or crackles Cardiovascular: Regular rate and rhythm without murmur gallop or rub normal S1 and S2 Abdomen: Nontender, nondistended, soft, bowel sounds positive, no rebound, no ascites, no appreciable mass Extremities: No significant cyanosis, clubbing, or edema bilateral lower extremities Neurologic; pupils equal round reactive to light and accommodation, cranial nerves II through XII intact, mild slowing of speech (noted yesterday as well), extremity strength 5/5, sensation intact  Data Reviewed: Basic Metabolic Panel:  Recent Labs Lab 04/21/14 2220 04/22/14 0040 04/22/14 0535 04/23/14 0340 04/24/14 0300  NA 131* 133* 137 134* 137  K 4.5 3.8 4.4 4.3 4.8  CL 97 100 104 101 104  CO2 24 22 22 23 24   GLUCOSE 338* 161* 161* 126* 187*  BUN 46* 44* 41* 37* 28*  CREATININE 2.02* 2.04* 1.83* 1.87* 1.52*  CALCIUM 8.7 8.7 8.6 8.8 8.8  MG  --   --   --  2.2  --    Liver Function Tests:  Recent Labs Lab 04/21/14 1332 04/23/14 0340  AST 13 15  ALT 13 15  ALKPHOS 134* 113  BILITOT 0.5 0.4  PROT 6.6 5.9*  ALBUMIN 2.9* 2.6*   CBC:  Recent Labs Lab 04/21/14 1332 04/21/14 1951 04/23/14 0340  WBC 8.2 6.8 6.7  NEUTROABS 6.5  --  4.5  HGB 12.1* 11.6* 11.7*  HCT 35.5* 34.8* 36.5*  MCV 71.1* 73.3* 75.6*  PLT 145* 140* 136*   Cardiac Enzymes:  Recent Labs Lab 04/21/14 1951 04/22/14 0040 04/22/14 0835  TROPONINI <0.30 <0.30 <0.30   CBG:  Recent Labs Lab 04/23/14 1936 04/23/14 2344 04/24/14 0440 04/24/14 0801 04/24/14 1217  GLUCAP 175* 190* 188* 147* 244*    Recent Results (from the past 240 hour(s))  CULTURE, BLOOD (SINGLE)     Status: None   Collection Time    04/16/14  4:04 PM      Result Value Ref Range Status   BLOOD  CULTURE, ROUTINE Final report   Final   RESULT 1 Comment   Final   Comment: No aerobic or anaerobic growth in five days.  CULTURE, BLOOD (SINGLE)     Status: None   Collection Time    04/16/14  4:04 PM      Result Value Ref Range Status   BLOOD CULTURE, ROUTINE Final report   Final   RESULT 1 Comment   Final   Comment: No aerobic or anaerobic growth in five days.  MRSA PCR SCREENING     Status: None   Collection Time    04/21/14  7:10 PM      Result Value Ref Range Status   MRSA by PCR NEGATIVE  NEGATIVE Final   Comment:            The GeneXpert MRSA  Assay (FDA     approved for NASAL specimens     only), is one component of a     comprehensive MRSA colonization     surveillance program. It is not     intended to diagnose MRSA     infection nor to guide or     monitor treatment for     MRSA infections.    Studies:  Recent x-ray studies have been reviewed in detail by the Attending Physician  Scheduled Meds:  Scheduled Meds: . amLODipine  2.5 mg Oral Daily  . antiseptic oral rinse  7 mL Mouth Rinse BID  . aspirin  325 mg Oral Daily  . atorvastatin  10 mg Oral QHS  . citalopram  20 mg Oral Daily  . enoxaparin (LOVENOX) injection  40 mg Subcutaneous Q24H  . fluticasone  2 spray Each Nare Daily  . insulin aspart  0-15 Units Subcutaneous 6 times per day  . insulin glargine  8 Units Subcutaneous Daily  . metoprolol succinate  100 mg Oral Daily  . pantoprazole  80 mg Oral Q1200  . tamsulosin  0.4 mg Oral BID  . vitamin B-12  1,000 mcg Oral Daily   Time spent on care of this patient: 35 mins  Cherene Altes, MD [facility] For Consults/Admissions - Flow Manager - [phone] Office  [phone] Pager [phone]  On-Call/Text Page:      [email or web address]      password [facility]  04/24/2014, 4:44 PM   LOS: 3 days

## 2014-04-24 NOTE — Evaluation (Signed)
Clinical/Bedside Swallow Evaluation Patient Details  Name: Barry Haley MRN: [id number] Date of Birth: 08-Aug-1946  Today's Date: 04/24/2014 Time: [id number] SLP Time Calculation (min): 20 min  Past Medical History:  Past Medical History  Diagnosis Date  . Hypertension   . Hyperlipidemia   . Diabetes   . Cerebral aneurysm, nonruptured   . Epistaxis   . Anxiety state, unspecified   . Unspecified cerebral artery occlusion with cerebral infarction   . Other shock without mention of trauma   . Personal history of other disorder of urinary system   . Personal history of other diseases of digestive system   . Hx of migraines   . Sleep apnea   . Meningitis 2010   Past Surgical History:  Past Surgical History  Procedure Laterality Date  . Appendectomy      child  . 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 Mitral valve reconstruction with cardiopulmonary bypass  01/27/2013  . Tee without cardioversion N/A 04/22/2014    Procedure: TRANSESOPHAGEAL ECHOCARDIOGRAM (TEE);  Surgeon: Thayer Headings, MD;  Location: [facility]: [facility];  Service: Cardiovascular;  Laterality: N/A;   HPI:  68 y.o. male with multiple risk factors for stroke as well as history of previous hemorrhagic stroke for ruptured aneurysm, presenting with probable subacute right occipital infarction with left homonymous hemianopsia, as well as increasing confusion and gait instability. BSE ordered to determine safe diet.   Assessment / Plan / Recommendation Clinical Impression  Pt completed oral care after set up. Oral motor strength and function are adequate. No overt s/s aspiration or clinical indication of airway compromise on any consistency tested. Recommend regular diet with thin liquids. No further ST intervention for swallowing recommended at this time.    Aspiration Risk  Mild    Diet Recommendation Regular;Thin liquid   Liquid Administration via: Cup;Straw Medication Administration: Whole meds with  liquid Supervision: Patient able to self feed Compensations: Slow rate;Small sips/bites Postural Changes and/or Swallow Maneuvers: Out of bed for meals    Other  Recommendations Oral Care Recommendations: Oral care BID   Follow Up Recommendations  None    Frequency and Duration        Pertinent Vitals/Pain VSS, no pain reported    SLP Swallow Goals  n/a   Swallow Study Prior Functional Status   no history of swallowing difficulty. Regular diet/thin liquid prior to admit.    General Date of Onset: 04/21/14 HPI: 68 y.o. male with multiple risk factors for stroke as well as history of previous hemorrhagic stroke for ruptured aneurysm, presenting with probable subacute right occipital infarction with left homonymous hemianopsia, as well as increasing confusion and gait instability. BSE ordered to determine safe diet. Type of Study: Bedside swallow evaluation Previous Swallow Assessment: none found Diet Prior to this Study: NPO Temperature Spikes Noted: No Respiratory Status: Room air History of Recent Intubation: No Behavior/Cognition: Alert;Cooperative;Pleasant mood Oral Cavity - Dentition: Adequate natural dentition Self-Feeding Abilities: Able to feed self Patient Positioning: Upright in bed Baseline Vocal Quality: Clear Volitional Cough: Strong Volitional Swallow: Able to elicit    Oral/Motor/Sensory Function Overall Oral Motor/Sensory Function: Appears within functional limits for tasks assessed   Ice Chips Ice chips: Not tested   Thin Liquid Thin Liquid: Within functional limits Presentation: Straw;Cup    Nectar Thick Nectar Thick Liquid: Not tested   Honey Thick Honey Thick Liquid: Not tested   Puree Puree: Within functional limits Presentation: Self Fed;Spoon   Solid   Barry Haley   Barry Haley B.  Barry Haley [facility], CCC-SLP [id number] Solid: Within functional limits Presentation: Barry Haley, Barry Haley 04/24/2014,9:43 AM

## 2014-04-24 NOTE — Progress Notes (Addendum)
STROKE TEAM PROGRESS NOTE   HISTORY Barry Haley is an 68 y.o. male history hypertension, hyperlipidemia, diabetes mellitus and hemorrhagic stroke with aneurysm rupture, presenting with visual changes which started 10 days ago, as well as progressively worsening gait and increasing confusion. Patient was last known well 04/11/2014. He was subsequently seen by ophthalmology and was found to have left visual field defect and likely right occipital stroke. Patient stroke workup is in progress. Patient was scheduled for an MRI study, which was attempted today but unsuccessful because of apparent claustrophobia. CT scan of the head his been ordered and is pending. TEE was also ordered, as the patient has a history of valve replacement. NIH stroke score was 6. Patient was not administered TPA secondary to Beyond time under for treatment consideration. He was admitted for further evaluation and treatment.  I saw this pt in clinic just about one week ago. 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 In 2014, he again developed sepsis with bacteremia and TEE showed endocarditis. He had MV and TV repair in [facility]. Recently developed CHF and followed up with Dr. Bridgett Larsson in [facility] cardiology. He started to have left hemianopia two weeks ago and concerns for a new stroke. I have ordered him MRI and TEE and EEG and blood tests last week. His MRI not done,TEE scheduled today, EEG negative for seizure and A1C was super high at 13.7. He recently developed generalized weakness, lethargy and was admitted due to AKI and hyperglycemia.  SUBJECTIVE (INTERVAL HISTORY) His wife is at the bedside.  Overall he feels his condition is gradually improving. He is off insulin drip and BG was currently fluctuating. On SSI and oral meds now. He is not seeing objects in his left visual field any more.  His hemianopia also improved. MRI showed no acute infarct. Likely his hemianopia is the recrudescence of previous right occipital hemorrhagic infarct in 2011 in the setting of DKA and AKI.   OBJECTIVE  Dg Chest 2 View 04/22/2014   IMPRESSION: No active cardiopulmonary disease.     Ct Head Wo Contrast 04/21/2014  IMPRESSION: 1. No acute intracranial abnormality. 2. Atrophy and chronic microvascular white matter ischemic changes.    CUS 01/2013 This is an abnormal carotid duplex ultrasound with color flow imaging. .  There is 50-75% stenosis of the left ICA by Doppler criteria, although this  is likely at the low end of the range. No severe luminal narrowing noted,  although there is moderate plaque. The Vertebrals demonstrate foward flow  bilaterally . Insonation at multiple depths was attempted of the  [facility]. This is an abnormal Transcranial  Doppler Ultrasound examination. The right ACA velocity is slightly elevated,  but does not meet criteria for stenosis and most likely reflects hyperemia or  collateral flow. The resistance is increased in multiple vessels, which may  point to diffuse atherosclerosis or distal small vessel disease. However,  there is no evidence of stenosis, vasospasm, or flow diversion. There was no  previous study available for direct comparison.  CUS 04/2014 - 1-39% internal carotid artery stenosis bilaterally. Vertebral arteries are patent with antegrade flow.  TEE - Impression: No vegetations seen EF 35-40% + PFO ( markedly positive)  He is bradycardic and LV function is moderately reduced.  I have DCd his Diltiazem for now..  Transthoracic echo to be done.   TTE - Left ventricle: Systolic  function was moderately to severely reduced. The estimated ejection fraction was in the range of 30% to 35%. - Aortic valve: No evidence of vegetation. - Mitral valve: No evidence of vegetation. - Tricuspid valve: No evidence of  vegetation.  EEG - Summary This is a severely abnormal EEG due to the presence of severe background slowing indicative of bihemispheric dysfunction which is a nonspecific finding seen in a variety of degenerative, toxic, metabolic and ischemic etiologies. No definite epileptiform activity is noted.  MRI - Negative for acute infarct  Numerous areas of chronic hemorrhage in the brain. Question  hypertension versus cerebral amyloid.  Diffuse intracranial atherosclerotic disease without large vessel  occlusion.  2 mm aneurysm anterior communicating artery.  Question severe stenosis proximal right common carotid artery versus  artifact. Favor artifact. Both carotid bifurcations are patent  bilaterally  Atherosclerotic disease in the proximal vertebral arteries. Focal  area of signal loss in the proximal right vertebral artery may be  due to severe stenosis or artifact.   BMET    Component Value Date/Time   NA 137 04/24/2014 0300   K 4.8 04/24/2014 0300   CL 104 04/24/2014 0300   CO2 24 04/24/2014 0300   GLUCOSE 187* 04/24/2014 0300   BUN 28* 04/24/2014 0300   CREATININE 1.52* 04/24/2014 0300   CALCIUM 8.8 04/24/2014 0300   GFRNONAA 45* 04/24/2014 0300   GFRAA 53* 04/24/2014 0300   CBC    Component Value Date/Time   WBC 6.7 04/23/2014 0340   RBC 4.86 04/24/2014 0300   RBC 4.83 04/23/2014 0340   HGB 11.7* 04/23/2014 0340   HCT 36.5* 04/23/2014 0340   PLT 136* 04/23/2014 0340   MCV 75.6* 04/23/2014 0340   MCH 24.2* 04/23/2014 0340   MCHC 32.1 04/23/2014 0340   RDW 16.6* 04/23/2014 0340   LYMPHSABS 1.2 04/23/2014 0340   MONOABS 0.7 04/23/2014 0340   EOSABS 0.3 04/23/2014 0340   BASOSABS 0.0 04/23/2014 0340   Lipid Panel     Component Value Date/Time   CHOL  Value: 112        ATP III CLASSIFICATION:  <200     mg/dL   Desirable  200-239  mg/dL   Borderline High  >=240    mg/dL   High        01/04/2010 0700   TRIG 196* 04/16/2014 1604   HDL 25* 04/16/2014 1604   HDL 18* 01/04/2010 0700   CHOLHDL  5.7* 04/16/2014 1604   CHOLHDL 6.2 01/04/2010 0700   VLDL 22 01/04/2010 0700   LDLCALC 78 04/16/2014 1604   LDLCALC  Value: 72        Total Cholesterol/HDL:CHD Risk Coronary Heart Disease Risk Table                     Men   Women  1/2 Average Risk   3.4   3.3  Average Risk       5.0   4.4  2 X Average Risk   9.6   7.1  3 X Average Risk  23.4   11.0        Use the calculated Patient Ratio above and the CHD Risk Table to determine the patient's CHD Risk.        ATP III CLASSIFICATION (LDL):  <100     mg/dL   Optimal  100-129  mg/dL   Near or Above  Optimal  130-159  mg/dL   Borderline  160-189  mg/dL   High  >190     mg/dL   Very High 01/04/2010 0700    Lab Results  Component Value Date   HGBA1C 13.7* 04/16/2014    PHYSICAL EXAM Physical exam  Temp:  [97.6 F (36.4 C)-98.4 F (36.9 C)] 97.8 F (36.6 C) (08/22 2115) Pulse Rate:  [59-76] 61 (08/22 2115) Resp:  [10-18] 18 (08/22 2115) BP: (133-156)/(54-71) 148/57 mmHg (08/22 2115) SpO2:  [98 %-100 %] 98 % (08/22 2115)  General - Well nourished, well developed, in no apparent distress.  Ophthalmologic - not able to keep eye still, not able to see fundus.  Cardiovascular - Regular rate and rhythm with no murmur with intermittent premature beats.  Mental Status -  Level of arousal and orientation to place, and person were intact, but not to time. Language including expression, naming, repetition, comprehension was assessed and found intact.  Cranial Nerves II - XII - II - Visual field testing showed mainly left upper quadranopia, improved from previous exam. He has psychological impersistence (can not keep looking forward, kept looking at fingers on testing). III, IV, VI - Extraocular movements intact. V - Facial sensation intact bilaterally. VII - Facial movement intact bilaterally. VIII - Hearing & vestibular intact bilaterally. X - Palate elevates symmetrically. XI - Chin turning & shoulder shrug intact bilaterally. XII  - Tongue protrusion intact.  Motor Strength - The patient's strength was normal in all extremities and pronator drift was absent.  Bulk was normal and fasciculations were absent.   Motor Tone - Muscle tone was assessed at the neck and appendages and was normal.  Reflexes - The patient's reflexes were 1+ in all extremities and he had no pathological reflexes.  Sensory - Light touch, temperature/pinprick were assessed and were normal.    Coordination - The patient had normal movements in the hands and feet with no ataxia or dysmetria.  Tremor was absent. However, he has mild asterixis on both hands.   Gait and Station - not tested.  ASSESSMENT/PLAN  Mr. Hoss Palermo is a 68 y.o. 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 However, MRI showed no acute infarct. It is likely the the recrudescence of previous right occipital hemorrhagic infarct in 2011 in the setting of DKA and AKI.   We will have Dr. Kathyrn Sheriff to see him regarding the aneurysm evaluation and management.   His EEG was diffuse slowing and no seizure activity. 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 Now seems resolved. Also his visual field improved today.  His etiology for previous stroke are most likely due to endocarditis. 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 Currently he does not have acute stroke on MRI and his left hemianopia and seeing objects in his left visual field is most likely due to  the recrudescence of previous right occipital hemorrhagic infarct in 2011 in the setting of DKA and AKI. Currently his visual field deficit is improving after metabolic derangement  is being corrected.   Stroke:       aspirin 325 mg orally every day prior to admission, continue for now  MRI and MRA did not show acute infarct but diffuse intracranial atherosclerosis.   2D Echo EF 30-35%   Carotid negative this time, but abnormal in 01/2013 with 50-75% stenosis of the left ICA  TEE no vegetation  LDL 78, goal of LDL <70, continue lipitor  HgbA1c 13.7, on insulin drip now  lovenox subq for VTE prophylaxis  Therapy needs:  CIR, needs OT eval for hemianopia and driving restrictions  Risk factor management/education  Patient counseled to be compliant with his antithrombotic medications  Pt has 9/24 appointment with Dr. Erlinda Hong in [facility] clinic.  Hypertension   Home meds: amlodipine. Resumed in hospital  BP 120-160/40-60 past 24h  SBP goal 130/80  Stable  Patient counseled to be compliant with his blood pressure medications  Hyperlipidemia  LDL 78, continue lipitor  Patient on lipitor at home, continued in hospital (formulary)  LDL goal <70 for diabetics  Diabetes  HgbA1c 13.7   Uncontrolled  Off insulin drip now  On diet and po meds and SSI  Goal < 7.0  educate patient about lifestyle changes for diabetes prevention  Needs better control as outpt  Cerebral aneurysms - will have Dr. Kathyrn Sheriff to follow up  Hx of endocarditis - s/p MV and TV repair - blood culture so far NGTD - no sign of active infection - TEE no vegetation  Other Stroke Risk Factors Cigarette smoker, advised to stop smoking   Obesity, Body mass index is 32.76 kg/(m^2).    Hx stroke/TIA   coronary artery disease - CHF - OSA  Hospital day # 3  This is a complicated pt with complex PMH and active medical conditions. I had long discussion with pt and his wife regarding current diagnosis,  upcoming tests, and treatment plan. They understood pt current situation, and all questions are answered. Will sign off for now, please call with further questions.  Rosalin Hawking, MD PhD Stroke Neurology 04/24/2014 10:06 PM  To contact Stroke Continuity provider, please refer to [email or web address] After hours, contact General Neurology

## 2014-04-25 ENCOUNTER — Other Ambulatory Visit: Payer: Medicare Other

## 2014-04-25 DIAGNOSIS — I6350 Cerebral infarction due to unspecified occlusion or stenosis of unspecified cerebral artery: Secondary | ICD-10-CM

## 2014-04-25 LAB — BASIC METABOLIC PANEL
Anion gap: 8 (ref 5–15)
BUN: 29 mg/dL — AB (ref 6–23)
CALCIUM: 8.7 mg/dL (ref 8.4–10.5)
CO2: 25 meq/L (ref 19–32)
Chloride: 101 mEq/L (ref 96–112)
Creatinine, Ser: 1.61 mg/dL — ABNORMAL HIGH (ref 0.50–1.35)
GFR calc Af Amer: 49 mL/min — ABNORMAL LOW (ref >90)
GFR calc non Af Amer: 42 mL/min — ABNORMAL LOW (ref >90)
GLUCOSE: 206 mg/dL — AB (ref 70–99)
Potassium: 4.6 mEq/L (ref 3.7–5.3)
Sodium: 134 mEq/L — ABNORMAL LOW (ref 137–147)

## 2014-04-25 LAB — GLUCOSE, CAPILLARY
GLUCOSE-CAPILLARY: 193 mg/dL — AB (ref 70–99)
GLUCOSE-CAPILLARY: 174 mg/dL — AB (ref 70–99)
GLUCOSE-CAPILLARY: 321 mg/dL — AB (ref 70–99)
Glucose-Capillary: 262 mg/dL — ABNORMAL HIGH (ref 70–99)

## 2014-04-25 MED ORDER — INSULIN ASPART 100 UNIT/ML ~~LOC~~ SOLN
4.0000 [IU] | Freq: Once | SUBCUTANEOUS | Status: AC
  Administered 2014-04-25: 4 [IU] via SUBCUTANEOUS

## 2014-04-25 MED ORDER — GLIMEPIRIDE 1 MG PO TABS
1.0000 mg | ORAL_TABLET | Freq: Every day | ORAL | Status: DC
  Administered 2014-04-26: 1 mg via ORAL
  Filled 2014-04-25 (×2): qty 1

## 2014-04-25 NOTE — Progress Notes (Signed)
VASCULAR LAB PRELIMINARY  PRELIMINARY  PRELIMINARY  PRELIMINARY  Bilateral lower extremity venous duplex  completed.    Preliminary report:  Bilateral:  No evidence of DVT, superficial thrombosis, or Baker's Cyst.    Jinnifer Montejano, RVT 04/25/2014, 11:31 AM

## 2014-04-25 NOTE — Progress Notes (Signed)
[place] TEAM 1 - Stepdown/ICU TEAM Progress Note  Barry Haley [id number] DOB: 07-Jul-1946 DOA: 04/21/2014 PCP: [name], MD  Admit HPI / Brief Narrative: 68 year old M Hx tricuspid and mitral valve repair (tissue), HTN, hyperlipidemia, diabetes mellitus, anxiety, stroke (2010 per patient), and multiple cerebral aneurysms (last evaluated by MRI on 01/26/2012) who presented with one-week history of "floaters" in his visual field. Apparently, the patient went to see his ophthalmologist and was diagnosed with a left hemianopsia and likely right occipital stroke. The patient was sent to Neurology. He saw Dr. Erlinda Hong on 8/14 and outpt stroke workup was started. Unfortunately, the patient experienced increasing confusion that worsened since 04/16/2014. He was noted to have hemoglobin A1c of 13.7 on 04/16/2014. He was instructed to go to his primary care physician whom he saw on the day of admission. His CBG was over 450 in the office, and he was sent to the emergency department. In addition, the patient has had gait instability with mechanical fall 2 days prior to this admission.   In emergency department, the patient had serum glucose 480 with an anion gap of 15. He was given 1 L normal saline. MRI was ordered, but he was not able to tolerate it due to phobia and agitation. Urinalysis was negative for pyuria. Urine drug screen and alcohol were negative. EKG showed sinus rhythm with ST depression V1 to V3.   Since admission, the patient's DKA has been corrected.  He has undergone a TEE, and carotid dopplers.  He underwent an MRI/MRA w/ propofol sedation.  Clinically he appears to be slowly stabilizing.  His neuro/CVA eval is being directed by Neurology.    HPI/Subjective: No new complaints today.  Denies CP, sob, f/c, n/v, or abdom pain.  No HA.    Assessment/Plan:  DKA - uncontrolled DM2 DKA resolved w/ insulin gtt - A1c 13.7 - CBG fluctuating - resume home oral meds and continue SSI to  determine if initiation of home insulin will be required     Left hemianopsia - suspected R occipital CVA recrudescence -Neurology following and suggests no further changes in tx plan at this time  -MRI negative for acute infarct -Carotid ultrasound w/o signif stenosis  -TEE w/o evidence of vegetation  -LDL 78  -cont full dose ASA tx  31mm aneurysm anterior communicating artery -noted via MRI/A - as per Neurology recs, to be followed up per Neurosurgery as outpt  Newly diagnosed acute systolic CHF  -EF [id number] on TEE 8/20 - no evidence of volume overload at this time - follow clinically - watch weights and Is/Os - will need to consider eval for ishcemic etiology once clinically stable otherwise (renal failure would presently prohibit cath) - pt followed by Cardiology at [facility] - TTE July 2015 at [facility] noted EF 55-60%   Hx of endocarditis s/p MV and TV repair 2014 - blood cultures NGTD  - no sign of active infection  - TEE w/o evidence of vegetation  Abnormal EKG (T wave inversions V1 + V2) -no chest pain  -Troponins x3 negative    Hypertension  -in absence of acute CVA, will attempt more strict control - no change in meds today - cont to monitor   Acute on Chronic renal failure (CKD2)  -due to volume depletion - baseline creatinine around 1.2 (value from 01/2010) - hold all nephrotoxic medication and cont to hydrate   Depression/anxiety  -Continue Celexa and clonazepam   Tobacco abuse  Obesity - Body mass index is 32.76 kg/(m^2).  Code Status: FULL Family Communication:  Spoke w/ wife at bedside  Disposition Plan:  Hopeful for transfer to CIR bed in AM   Consultants: Neurology   Antibiotics: NA  DVT prophylaxis: Lovenox  Objective: VITAL SIGNS: Temp: 98.6 F (37 C) (08/23 1416) Temp src: Oral (08/23 1416) BP: 132/57 mmHg (08/23 1416) Pulse Rate: 58 (08/23 1416)  Intake/Output Summary (Last 24 hours) at 04/25/14 1601 Last data filed at 04/25/14 1300  Gross per 24  hour  Intake    740 ml  Output   1200 ml  Net   -460 ml   Exam: General: No acute respiratory distress Lungs: Clear to auscultation bilaterally without wheezes or crackles Cardiovascular: Regular rate and rhythm without murmur gallop or rub Abdomen: Nontender, nondistended, soft, bowel sounds positive, no rebound, no ascites, no appreciable mass Extremities: No significant cyanosis, clubbing, or edema bilateral lower extremities Neurologic; pupils equal round reactive to light and accommodation, cranial nerves II through XII intact, mild slowing of speech (noted yesterday as well), extremity strength 5/5, sensation intact  Data Reviewed: Basic Metabolic Panel:  Recent Labs Lab 04/22/14 0040 04/22/14 0535 04/23/14 0340 04/24/14 0300 04/25/14 0336  NA 133* 137 134* 137 134*  K 3.8 4.4 4.3 4.8 4.6  CL 100 104 101 104 101  CO2 22 22 23 24 25   GLUCOSE 161* 161* 126* 187* 206*  BUN 44* 41* 37* 28* 29*  CREATININE 2.04* 1.83* 1.87* 1.52* 1.61*  CALCIUM 8.7 8.6 8.8 8.8 8.7  MG  --   --  2.2  --   --    Liver Function Tests:  Recent Labs Lab 04/21/14 1332 04/23/14 0340  AST 13 15  ALT 13 15  ALKPHOS 134* 113  BILITOT 0.5 0.4  PROT 6.6 5.9*  ALBUMIN 2.9* 2.6*   CBC:  Recent Labs Lab 04/21/14 1332 04/21/14 1951 04/23/14 0340  WBC 8.2 6.8 6.7  NEUTROABS 6.5  --  4.5  HGB 12.1* 11.6* 11.7*  HCT 35.5* 34.8* 36.5*  MCV 71.1* 73.3* 75.6*  PLT 145* 140* 136*   Cardiac Enzymes:  Recent Labs Lab 04/21/14 1951 04/22/14 0040 04/22/14 0835  TROPONINI <0.30 <0.30 <0.30   CBG:  Recent Labs Lab 04/24/14 1217 04/24/14 1652 04/24/14 2121 04/25/14 0637 04/25/14 1134  GLUCAP 244* 219* 250* 193* 174*    Recent Results (from the past 240 hour(s))  CULTURE, BLOOD (SINGLE)     Status: None   Collection Time    04/16/14  4:04 PM      Result Value Ref Range Status   BLOOD CULTURE, ROUTINE Final report   Final   RESULT 1 Comment   Final   Comment: No aerobic or  anaerobic growth in five days.  CULTURE, BLOOD (SINGLE)     Status: None   Collection Time    04/16/14  4:04 PM      Result Value Ref Range Status   BLOOD CULTURE, ROUTINE Final report   Final   RESULT 1 Comment   Final   Comment: No aerobic or anaerobic growth in five days.  MRSA PCR SCREENING     Status: None   Collection Time    04/21/14  7:10 PM      Result Value Ref Range Status   MRSA by PCR NEGATIVE  NEGATIVE Final   Comment:            The GeneXpert MRSA Assay (FDA     approved for NASAL specimens     only), is  one component of a     comprehensive MRSA colonization     surveillance program. It is not     intended to diagnose MRSA     infection nor to guide or     monitor treatment for     MRSA infections.    Studies:  Recent x-ray studies have been reviewed in detail by the Attending Physician  Scheduled Meds:  Scheduled Meds: . antiseptic oral rinse  7 mL Mouth Rinse BID  . aspirin  325 mg Oral Daily  . atorvastatin  10 mg Oral QHS  . carvedilol  6.25 mg Oral BID WC  . citalopram  20 mg Oral Daily  . enoxaparin (LOVENOX) injection  40 mg Subcutaneous Q24H  . fluticasone  2 spray Each Nare Daily  . glimepiride  0.5 mg Oral Q breakfast  . insulin aspart  0-15 Units Subcutaneous TID WC  . insulin glargine  8 Units Subcutaneous Daily  . linagliptin  5 mg Oral Daily  . pantoprazole  80 mg Oral Q1200  . tamsulosin  0.4 mg Oral BID  . vitamin B-12  1,000 mcg Oral Daily   Time spent on care of this patient: 35 mins  Cherene Altes, MD [facility] For Consults/Admissions - Flow Manager - [phone] Office  [phone] Pager [phone]  On-Call/Text Page:      [email or web address]      password [facility]  04/25/2014, 4:01 PM   LOS: 4 days

## 2014-04-26 ENCOUNTER — Encounter (HOSPITAL_COMMUNITY): Payer: Self-pay | Admitting: Radiology

## 2014-04-26 ENCOUNTER — Ambulatory Visit: Payer: Self-pay | Admitting: Neurology

## 2014-04-26 LAB — BASIC METABOLIC PANEL
Anion gap: 9 (ref 5–15)
BUN: 28 mg/dL — ABNORMAL HIGH (ref 6–23)
CALCIUM: 8.6 mg/dL (ref 8.4–10.5)
CO2: 23 mEq/L (ref 19–32)
Chloride: 103 mEq/L (ref 96–112)
Creatinine, Ser: 1.48 mg/dL — ABNORMAL HIGH (ref 0.50–1.35)
GFR, EST AFRICAN AMERICAN: 54 mL/min — AB (ref >90)
GFR, EST NON AFRICAN AMERICAN: 47 mL/min — AB (ref >90)
GLUCOSE: 175 mg/dL — AB (ref 70–99)
POTASSIUM: 4.7 meq/L (ref 3.7–5.3)
SODIUM: 135 meq/L — AB (ref 137–147)

## 2014-04-26 LAB — GLUCOSE, CAPILLARY
Glucose-Capillary: 187 mg/dL — ABNORMAL HIGH (ref 70–99)
Glucose-Capillary: 170 mg/dL — ABNORMAL HIGH (ref 70–99)
Glucose-Capillary: 186 mg/dL — ABNORMAL HIGH (ref 70–99)

## 2014-04-26 MED ORDER — INSULIN GLARGINE 100 UNIT/ML ~~LOC~~ SOLN
6.0000 [IU] | Freq: Every day | SUBCUTANEOUS | Status: DC
  Administered 2014-04-27: 6 [IU] via SUBCUTANEOUS
  Filled 2014-04-26: qty 0.06

## 2014-04-26 MED ORDER — GLIMEPIRIDE 4 MG PO TABS
2.0000 mg | ORAL_TABLET | Freq: Every day | ORAL | Status: DC
  Administered 2014-04-27: 2 mg via ORAL
  Filled 2014-04-26: qty 1

## 2014-04-26 MED ORDER — AMLODIPINE BESYLATE 10 MG PO TABS
10.0000 mg | ORAL_TABLET | Freq: Every day | ORAL | Status: DC
  Administered 2014-04-26 – 2014-04-27 (×2): 10 mg via ORAL
  Filled 2014-04-26 (×2): qty 1

## 2014-04-26 NOTE — Clinical Social Work Placement (Addendum)
Clinical Social Work Department CLINICAL SOCIAL WORK PLACEMENT NOTE 04/26/2014  Patient:  Barry Haley, Barry Haley  Account Number:  [id number] Admit date:  04/21/2014  Clinical Social Worker:  Delrae Sawyers  Date/time:  04/26/2014 06:46 PM  Clinical Social Work is seeking post-discharge placement for this patient at the following level of care:   [place]   (*CSW will update this form in Epic as items are completed)   04/26/2014  Patient/family provided with [place] Department of Clinical Social Work's list of facilities offering this level of care within the geographic area requested by the patient (or if unable, by the patient's family).  04/26/2014  Patient/family informed of their freedom to choose among providers that offer the needed level of care, that participate in Medicare, Medicaid or managed care program needed by the patient, have an available bed and are willing to accept the patient.  04/26/2014  Patient/family informed of [facility]' ownership interest in [facility], as well as of the fact that they are under no obligation to receive care at this facility.  PASARR submitted to EDS on  PASARR number received on   FL2 transmitted to all facilities in geographic area requested by pt/family on  04/26/2014 FL2 transmitted to all facilities within larger geographic area on   Patient informed that his/her managed care company has contracts with or will negotiate with  certain facilities, including the following:     Patient/family informed of bed offers received:  04/27/2014 Patient chooses bed at [place] Physician recommends and patient chooses bed at    Patient to be transferred to [place] on  04/27/2014 Patient to be transferred to facility by [facility] Patient and family notified of transfer on 04/26/2014 Name of family member notified:  Darlyne Russian, pt's wife updated via phone.  The following  physician request were entered in Epic:   Additional Comments: PASARR previously existing.  Lubertha Sayres, MSW, [facility] Licensed Clinical Social Worker [phone] and [phone]

## 2014-04-26 NOTE — Clinical Social Work Psychosocial (Signed)
Clinical Social Work Department BRIEF PSYCHOSOCIAL ASSESSMENT 04/26/2014  Patient:  Barry Haley, Barry Haley     Account Number:  [id number]     Admit date:  04/21/2014  Clinical Social Worker:  Delrae Sawyers  Date/Time:  04/26/2014 06:38 PM  Referred by:  Physician  Date Referred:  04/26/2014 Referred for  SNF Placement   Other Referral:   CIR. Pt and pt's wife have decided to pursue SNF placement closer to room as opposed to CIR.   Interview type:  Patient Other interview type:   Pt's wife also present at bedside.    PSYCHOSOCIAL DATA Living Status:  WIFE Admitted from facility:   Level of care:   Primary support name:  Darlyne Russian Primary support relationship to patient:  SPOUSE Degree of support available:   Strong support system.    CURRENT CONCERNS Current Concerns  Post-Acute Placement   Other Concerns:   none.    SOCIAL WORK ASSESSMENT / PLAN CSW received notification from [facility] admissions liaison, stating pt and pt's wife wish to pursue SNF placement as opposed to CIR in order for pt to complete short-term rehabilitation closer to home.    CSW met with pt and pt's wife at bedside to discuss discharge disposition plan. Pt's wife stated family was interested in pursuing SNF placement at either [facility] at [place] or [facility]. Pt's wife informed CSW pt is from home with wife.    CSW to continue to follow and assist with discharge planning needs.   Assessment/plan status:  Psychosocial Support/Ongoing Assessment of Needs Other assessment/ plan:   none.   Information/referral to community resources:   [facility].    PATIENT'S/FAMILY'S RESPONSE TO PLAN OF CARE: Pt and pt's wife understanding and agreeable to CSW plan of care. Pt and pt's wife expressed no further questions or concerns at this time.       Lubertha Sayres, MSW, [facility] Licensed Clinical Social Worker [phone] and [phone]

## 2014-04-26 NOTE — ED Provider Notes (Signed)
History/physical exam/procedure(s) were performed by non-physician practitioner and as supervising physician I was immediately available for consultation/collaboration. I have reviewed all notes and am in agreement with care and plan.   Shaune Pollack, MD 04/26/14 2041

## 2014-04-26 NOTE — Progress Notes (Signed)
Rehab admissions - Evaluated for possible admission.  I met with patient and his wife.  They are interested in SNF and mentioned [facility] in [place].  They would like their rehab closer to home.  Patient did really well today ambulating 350 ft with min assist.  I will let case manager and social worker know of patient/wife wishes.  Call me for questions.  #[phone]

## 2014-04-26 NOTE — Progress Notes (Signed)
Inpatient Diabetes Program Recommendations  AACE/ADA: New Consensus Statement on Inpatient Glycemic Control (2013)  Target Ranges:  Prepandial:   less than 140 mg/dL      Peak postprandial:   less than 180 mg/dL (1-2 hours)      Critically ill patients:  140 - 180 mg/dL   Results for EMMANUELL, SELFE (MRN [id number]) as of 04/26/2014 12:28  Ref. Range 04/25/2014 06:37 04/25/2014 11:34 04/25/2014 16:51 04/25/2014 21:27  Glucose-Capillary Latest Range: 70-99 mg/dL 193 (H) 174 (H) 262 (H) 321 (H)  Results for RONDEL, KEATING (MRN [id number]) as of 04/26/2014 12:28  Ref. Range 04/24/2014 08:01 04/24/2014 12:17 04/24/2014 16:52 04/24/2014 21:21  Glucose-Capillary Latest Range: 70-99 mg/dL 147 (H) 244 (H) 219 (H) 250 (H)   Diabetes history:DM2 Outpatient Diabetes medications: Amaryl 0.5 mg QAM, Januvia 50 mg daily Current orders for Inpatient glycemic control: Lantus 8 units QHS, Novolog 0-15 units AC, Tradjenta 5 mg daily, Amaryl 1 mg QAM  Inpatient Diabetes Program Recommendations Insulin - Meal Coverage: May want to consider ordering Novolog 4 units TID with meals for meal coverage since post prandial glucose consistently elevated.  Thanks, Barnie Alderman, RN, MSN, CCRN Diabetes Coordinator Inpatient Diabetes Program [phone] (Team Pager) [phone] ([facility] office) [phone] [facility] office)

## 2014-04-26 NOTE — Progress Notes (Signed)
Occupational Therapy Treatment Patient Details Name: Charlotte Domanski MRN: [id number] DOB: 1945/12/06 Today's Date: 04/26/2014    History of present illness 68 y.o. male history hypertension, hyperlipidemia, diabetes mellitus and hemorrhagic stroke with aneurysm rupture, presenting with visual changes which started 10 days ago, as well as progressively worsening gait and increasing confusion. Patient was last known well 04/11/2014. He was subsequently seen by ophthalmology and was found to have left visual field defect and likely right occipital stroke. Patient stroke workup is in progress. NIH score 06 upon admission. Awaiting MRI.    OT comments  Pt. Eager for shower, wife present and was able to assist during session also.  Pt. And spouse agreeable to SNF placement per PT rec. Still requires cues for sequencing during functional tasks.  Follow Up Recommendations  SNF                 Precautions / Restrictions Precautions Precautions: Fall Precaution Comments: wife reports recent falls at home  Restrictions Weight Bearing Restrictions: No       Mobility Bed Mobility Overal bed mobility: Needs Assistance Bed Mobility: Supine to Sit     Supine to sit: Supervision;HOB elevated        Transfers Overall transfer level: Needs assistance Equipment used: 1 person hand held assist Transfers: Sit to/from Stand Sit to Stand: Min guard Stand pivot transfers: Min assist       General transfer comment: Steadying assist needed for standing.     Balance Overall balance assessment: Needs assistance;History of Falls Sitting-balance support: No upper extremity supported;Feet supported Sitting balance-Leahy Scale: Good     Standing balance support: Single extremity supported;During functional activity Standing balance-Leahy Scale: Poor Standing balance comment: Required HHA with static stance and challenges.                    ADL Overall ADL's : Needs  assistance/impaired     Grooming: Wash/dry face;Wash/dry hands;Applying deodorant;Brushing hair;Min guard;Set up;Sitting   Upper Body Bathing: Min guard Upper Body Bathing Details (indicate cue type and reason): cues for thoroughness and completing both sides of body vs only left side as pt. had some perseveration of washing left arm with cues for washing right and moving on to other body parts Lower Body Bathing: Min guard;Sit to/from stand Lower Body Bathing Details (indicate cue type and reason): cues for keeping hand on grab bar for safety Upper Body Dressing : Min guard;Sitting   Lower Body Dressing: Min guard;Sitting/lateral leans Lower Body Dressing Details (indicate cue type and reason): doffed socks by leaning forward and also crossing l/r legs over while seated Toilet Transfer: Minimal assistance;Ambulation;Regular Glass blower/designer Details (indicate cue type and reason): hand held assist     Tub/ Shower Transfer: Walk-in shower;Minimal assistance;Cueing for safety;Cueing for sequencing;Ambulation;Shower seat   Functional mobility during ADLs: Minimal assistance;Cueing for sequencing General ADL Comments: completed shower, required cues for sequencing and inclusion of both sides of body during bathing task                                      Cognition   Behavior During Therapy: [facility] for tasks assessed/performed Overall Cognitive Status: Impaired/Different from baseline Area of Impairment: Attention;Memory;Awareness;Problem solving Orientation Level: Disoriented to;Time;Situation Current Attention Level: Selective Memory: Decreased short-term memory  Following Commands: Follows one step commands consistently Safety/Judgement: Decreased awareness of safety;Decreased awareness of deficits Awareness: Emergent Problem Solving: Slow processing;Decreased  initiation;Difficulty sequencing                     Exercises General Exercises - Lower  Extremity Ankle Circles/Pumps: AROM;Both;10 reps;Supine Long Arc Quad: AROM;Both;10 reps;Seated                Pertinent Vitals/ Pain       Pain Assessment: No/denies pain                                                                 Progress Toward Goals  OT Goals(current goals can now be found in the care plan section)  Progress towards OT goals: Progressing toward goals     Plan Discharge plan needs to be updated                          Activity Tolerance Patient tolerated treatment well   Patient Left in bed;with call bell/phone within reach;with bed alarm set;with family/visitor present             Time: [id number] OT Time Calculation (min): 39 min  Charges: OT General Charges $OT Visit: 1 Procedure OT Treatments $Self Care/Home Management : 38-52 mins  Janice Coffin, COTA/L 04/26/2014, 3:03 PM

## 2014-04-26 NOTE — Progress Notes (Signed)
[place] TEAM 1 - Stepdown/ICU TEAM Progress Note  Barry Haley [id number] DOB: May 24, 1946 DOA: 04/21/2014 PCP: [name], MD  Admit HPI / Brief Narrative: 68 year old M Hx tricuspid and mitral valve repair (tissue), HTN, hyperlipidemia, diabetes mellitus, anxiety, stroke (2010 per patient), and multiple cerebral aneurysms (last evaluated by MRI on 01/26/2012) who presented with one-week history of "floaters" in his visual field. Apparently, the patient went to see his ophthalmologist and was diagnosed with a left hemianopsia and likely right occipital stroke. The patient was sent to Neurology. He saw Dr. Erlinda Hong on 8/14 and outpt stroke workup was started. Unfortunately, the patient experienced increasing confusion that worsened since 04/16/2014. He was noted to have hemoglobin A1c of 13.7 on 04/16/2014. He was instructed to go to his primary care physician whom he saw on the day of admission. His CBG was over 450 in the office, and he was sent to the emergency department. In addition, the patient has had gait instability with mechanical fall 2 days prior to this admission.   In emergency department, the patient had serum glucose 480 with an anion gap of 15. He was given 1 L normal saline. MRI was ordered, but he was not able to tolerate it due to phobia and agitation. Urinalysis was negative for pyuria. Urine drug screen and alcohol were negative. EKG showed sinus rhythm with ST depression V1 to V3.   Since admission, the patient's DKA has been corrected.  He has undergone a TEE, and carotid dopplers.  He underwent an MRI/MRA w/ propofol sedation.  Clinically he appears to be slowly stabilizing.  His neuro/CVA eval is being directed by Neurology.    HPI/Subjective: No new complaints today.  Denies CP, sob, f/c, n/v, or abdom pain.  No HA.    Assessment/Plan:  DKA - uncontrolled DM2 DKA resolved w/ insulin gtt - A1c 13.7 - CBG fluctuating - resumed home oral meds but at increased dose -  continue SSI to determine if initiation of home insulin will be required - overall, CBG appears to be improving   Left hemianopsia - suspected R occipital CVA recrudescence -Neurology following and suggests no further changes in tx plan at this time  -MRI negative for acute infarct -Carotid ultrasound w/o signif stenosis  -TEE w/o evidence of vegetation  -LDL 78  -cont full dose ASA tx  32mm aneurysm anterior communicating artery -noted via MRI/A - as per Neurology recs, to be followed up per Neurosurgery as outpt  Newly diagnosed acute systolic CHF  -EF [id number] on TEE 8/20 - no evidence of volume overload at this time - follow clinically - watch weights and Is/Os - will need to consider eval for ishcemic etiology once clinically stable otherwise (renal failure would presently prohibit cath) - pt followed by Cardiology at [facility] - TTE July 2015 at [facility] noted EF 55-60%   Hx of endocarditis s/p MV and TV repair 2014 - blood cultures NGTD  - no sign of active infection  - TEE w/o evidence of vegetation  Abnormal EKG (T wave inversions V1 + V2) -no chest pain  -Troponins x3 negative    Hypertension  -in absence of acute CVA, will attempt more strict control - meds adjusted today - follow trend - no ACE for now due to renal insuff   Acute on Chronic renal failure (CKD2)  -due to volume depletion - baseline creatinine around 1.2 (value from 01/2010) - hold all nephrotoxic medication - slowly approaching baseline   Depression/anxiety  -Continue Celexa  and clonazepam   Tobacco abuse  Obesity - Body mass index is 32.76 kg/(m^2).  Code Status: FULL Family Communication:  Spoke w/ wife at bedside  Disposition Plan:  Plan has now changed to d/c to SNF for long term rehab - SNF bed search under way  Consultants: Neurology   Antibiotics: NA  DVT prophylaxis: Lovenox  Objective: VITAL SIGNS: Temp: 97.7 F (36.5 C) (08/24 1012) Temp src: Oral (08/24 1012) BP: 169/64 mmHg (08/24  1012) Pulse Rate: 63 (08/24 1012)  Intake/Output Summary (Last 24 hours) at 04/26/14 1659 Last data filed at 04/26/14 0400  Gross per 24 hour  Intake    360 ml  Output   1175 ml  Net   -815 ml   Exam: General: No acute respiratory distress Lungs: Clear to auscultation bilaterally without wheezes or crackles Cardiovascular: Regular rate and rhythm without murmur gallop or rub Abdomen: Nontender, nondistended, soft, bowel sounds positive, no rebound, no ascites, no appreciable mass Extremities: No significant cyanosis, clubbing, or edema bilateral lower extremities  Data Reviewed: Basic Metabolic Panel:  Recent Labs Lab 04/22/14 0535 04/23/14 0340 04/24/14 0300 04/25/14 0336 04/26/14 0400  NA 137 134* 137 134* 135*  K 4.4 4.3 4.8 4.6 4.7  CL 104 101 104 101 103  CO2 22 23 24 25 23   GLUCOSE 161* 126* 187* 206* 175*  BUN 41* 37* 28* 29* 28*  CREATININE 1.83* 1.87* 1.52* 1.61* 1.48*  CALCIUM 8.6 8.8 8.8 8.7 8.6  MG  --  2.2  --   --   --    Liver Function Tests:  Recent Labs Lab 04/21/14 1332 04/23/14 0340  AST 13 15  ALT 13 15  ALKPHOS 134* 113  BILITOT 0.5 0.4  PROT 6.6 5.9*  ALBUMIN 2.9* 2.6*   CBC:  Recent Labs Lab 04/21/14 1332 04/21/14 1951 04/23/14 0340  WBC 8.2 6.8 6.7  NEUTROABS 6.5  --  4.5  HGB 12.1* 11.6* 11.7*  HCT 35.5* 34.8* 36.5*  MCV 71.1* 73.3* 75.6*  PLT 145* 140* 136*   Cardiac Enzymes:  Recent Labs Lab 04/21/14 1951 04/22/14 0040 04/22/14 0835  TROPONINI <0.30 <0.30 <0.30   CBG:  Recent Labs Lab 04/25/14 1134 04/25/14 1651 04/25/14 2127 04/26/14 0638 04/26/14 1148  GLUCAP 174* 262* 321* 187* 170*    Recent Results (from the past 240 hour(s))  MRSA PCR SCREENING     Status: None   Collection Time    04/21/14  7:10 PM      Result Value Ref Range Status   MRSA by PCR NEGATIVE  NEGATIVE Final   Comment:            The GeneXpert MRSA Assay (FDA     approved for NASAL specimens     only), is one component of a      comprehensive MRSA colonization     surveillance program. It is not     intended to diagnose MRSA     infection nor to guide or     monitor treatment for     MRSA infections.    Studies:  Recent x-ray studies have been reviewed in detail by the Attending Physician  Scheduled Meds:  Scheduled Meds: . antiseptic oral rinse  7 mL Mouth Rinse BID  . aspirin  325 mg Oral Daily  . atorvastatin  10 mg Oral QHS  . carvedilol  6.25 mg Oral BID WC  . citalopram  20 mg Oral Daily  . enoxaparin (LOVENOX) injection  40  mg Subcutaneous Q24H  . fluticasone  2 spray Each Nare Daily  . glimepiride  1 mg Oral Q breakfast  . insulin aspart  0-15 Units Subcutaneous TID WC  . insulin glargine  8 Units Subcutaneous Daily  . linagliptin  5 mg Oral Daily  . pantoprazole  80 mg Oral Q1200  . tamsulosin  0.4 mg Oral BID  . vitamin B-12  1,000 mcg Oral Daily   Time spent on care of this patient: 35 mins  Cherene Altes, MD [facility] For Consults/Admissions - Flow Manager - [phone] Office  [phone] Pager [phone]  On-Call/Text Page:      [email or web address]      password [facility]  04/26/2014, 4:59 PM   LOS: 5 days

## 2014-04-26 NOTE — Progress Notes (Signed)
I did review the patient's recent MRA and his prior angiographic studies including formal catheter angiogram done May 2011. The present MRA demonstrates essentially stable Acom aneurysm measuring ~74mm. The medially projecting RICA aneurysm also appears stable within the limits of exam. Given the stable appearance of these aneurysms I feel outpatient follow-up of these lesions is appropriate after he has had a chance to recover from his current condition. At that time, we can discuss strategies for the radiologic observation of the aneurysms. If it would be helpful from a stroke standpoint, we could also consider outpatient diagnostic angiogram for evaluation of the intracranial/extracranial atherosclerotic disease seen on MRA.

## 2014-04-26 NOTE — Progress Notes (Signed)
Physical Therapy Treatment Patient Details Name: Barry Haley MRN: [id number] DOB: 1946-02-16 Today's Date: 04/26/2014    History of Present Illness 68 y.o. male history hypertension, hyperlipidemia, diabetes mellitus and hemorrhagic stroke with aneurysm rupture, presenting with visual changes which started 10 days ago, as well as progressively worsening gait and increasing confusion. Patient was last known well 04/11/2014. He was subsequently seen by ophthalmology and was found to have left visual field defect and likely right occipital stroke. Patient stroke workup is in progress. NIH score 06 upon admission. Awaiting MRI.     PT Comments    Pt admitted with above. Pt currently with functional limitations due to balance and endurance deficits.  Pt has had incr falls at home and increasing cognitive issues.  Feel short term NHP will be the best option for pt.  Pt and wife agree.   Pt will benefit from skilled PT to increase their independence and safety with mobility to allow discharge to the venue listed below.   Follow Up Recommendations  SNF;Supervision/Assistance - 24 hour     Equipment Recommendations  None recommended by PT    Recommendations for Other Services       Precautions / Restrictions Precautions Precautions: Fall Precaution Comments: wife reports recent falls at home  Restrictions Weight Bearing Restrictions: No    Mobility  Bed Mobility Overal bed mobility: Needs Assistance Bed Mobility: Supine to Sit     Supine to sit: Supervision;HOB elevated        Transfers Overall transfer level: Needs assistance Equipment used: 1 person hand held assist Transfers: Sit to/from Stand Sit to Stand: Min assist         General transfer comment: Steadying assist needed for standing.   Ambulation/Gait Ambulation/Gait assistance: Min assist Ambulation Distance (Feet): 350 Feet Assistive device: 1 person hand held assist Gait Pattern/deviations: Step-to  pattern;Decreased stride length;Drifts right/left   Gait velocity interpretation: Below normal speed for age/gender General Gait Details: Pt with unsteady gait at times with challenges.  Should do well with RW.  Pt and wife want brief NHP which this PT feels is appropriate.    Stairs            Wheelchair Mobility    Modified Rankin (Stroke Patients Only) Modified Rankin (Stroke Patients Only) Pre-Morbid Rankin Score: Moderately severe disability Modified Rankin: Moderately severe disability     Balance Overall balance assessment: Needs assistance;History of Falls Sitting-balance support: No upper extremity supported;Feet supported Sitting balance-Leahy Scale: Good     Standing balance support: Single extremity supported;During functional activity Standing balance-Leahy Scale: Poor Standing balance comment: Required HHA with static stance and challenges.                     Cognition Arousal/Alertness: Awake/alert Behavior During Therapy: WFL for tasks assessed/performed Overall Cognitive Status: Impaired/Different from baseline     Current Attention Level: Selective   Following Commands: Follows one step commands consistently   Awareness: Emergent        Exercises General Exercises - Lower Extremity Ankle Circles/Pumps: AROM;Both;10 reps;Supine Long Arc Quad: AROM;Both;10 reps;Seated    General Comments General comments (skin integrity, edema, etc.): Pt scored 18/24 on DGI suggesting pt at risk for falls without device.        Pertinent Vitals/Pain Pain Assessment: No/denies pain VSS.    Home Living                      Prior Function  PT Goals (current goals can now be found in the care plan section) Progress towards PT goals: Progressing toward goals    Frequency  Min 4X/week    PT Plan Discharge plan needs to be updated    Co-evaluation             End of Session Equipment Utilized During Treatment: Gait  belt;Oxygen Activity Tolerance: Patient limited by lethargy Patient left: in chair;with call bell/phone within reach;with family/visitor present     Time: 0929-1000 PT Time Calculation (min): 31 min  Charges:  $Gait Training: 8-22 mins $Therapeutic Exercise: 8-22 mins                    G Codes:      INGOLD,Lindley Stachnik 05-22-14, 2:04 PM [facility] [phone] (pager)

## 2014-04-27 ENCOUNTER — Other Ambulatory Visit: Payer: Medicare Other

## 2014-04-27 DIAGNOSIS — R5381 Other malaise: Secondary | ICD-10-CM | POA: Diagnosis not present

## 2014-04-27 DIAGNOSIS — E1110 Type 2 diabetes mellitus with ketoacidosis without coma: Secondary | ICD-10-CM | POA: Diagnosis not present

## 2014-04-27 DIAGNOSIS — IMO0001 Reserved for inherently not codable concepts without codable children: Secondary | ICD-10-CM | POA: Diagnosis not present

## 2014-04-27 DIAGNOSIS — E785 Hyperlipidemia, unspecified: Secondary | ICD-10-CM | POA: Diagnosis not present

## 2014-04-27 DIAGNOSIS — R262 Difficulty in walking, not elsewhere classified: Secondary | ICD-10-CM | POA: Diagnosis not present

## 2014-04-27 DIAGNOSIS — N179 Acute kidney failure, unspecified: Secondary | ICD-10-CM | POA: Diagnosis not present

## 2014-04-27 DIAGNOSIS — F329 Major depressive disorder, single episode, unspecified: Secondary | ICD-10-CM | POA: Diagnosis not present

## 2014-04-27 DIAGNOSIS — F3289 Other specified depressive episodes: Secondary | ICD-10-CM

## 2014-04-27 DIAGNOSIS — E1310 Other specified diabetes mellitus with ketoacidosis without coma: Secondary | ICD-10-CM | POA: Diagnosis not present

## 2014-04-27 DIAGNOSIS — I509 Heart failure, unspecified: Secondary | ICD-10-CM | POA: Diagnosis not present

## 2014-04-27 DIAGNOSIS — E1165 Type 2 diabetes mellitus with hyperglycemia: Secondary | ICD-10-CM | POA: Diagnosis not present

## 2014-04-27 DIAGNOSIS — R279 Unspecified lack of coordination: Secondary | ICD-10-CM | POA: Diagnosis not present

## 2014-04-27 DIAGNOSIS — I674 Hypertensive encephalopathy: Secondary | ICD-10-CM | POA: Diagnosis not present

## 2014-04-27 DIAGNOSIS — I671 Cerebral aneurysm, nonruptured: Secondary | ICD-10-CM | POA: Diagnosis not present

## 2014-04-27 DIAGNOSIS — M6281 Muscle weakness (generalized): Secondary | ICD-10-CM | POA: Diagnosis not present

## 2014-04-27 DIAGNOSIS — I6350 Cerebral infarction due to unspecified occlusion or stenosis of unspecified cerebral artery: Secondary | ICD-10-CM | POA: Diagnosis not present

## 2014-04-27 DIAGNOSIS — IMO0002 Reserved for concepts with insufficient information to code with codable children: Secondary | ICD-10-CM | POA: Diagnosis not present

## 2014-04-27 DIAGNOSIS — F411 Generalized anxiety disorder: Secondary | ICD-10-CM | POA: Diagnosis not present

## 2014-04-27 DIAGNOSIS — I10 Essential (primary) hypertension: Secondary | ICD-10-CM | POA: Diagnosis not present

## 2014-04-27 DIAGNOSIS — E118 Type 2 diabetes mellitus with unspecified complications: Secondary | ICD-10-CM | POA: Diagnosis not present

## 2014-04-27 DIAGNOSIS — H53469 Homonymous bilateral field defects, unspecified side: Secondary | ICD-10-CM | POA: Diagnosis not present

## 2014-04-27 DIAGNOSIS — I5032 Chronic diastolic (congestive) heart failure: Secondary | ICD-10-CM | POA: Diagnosis not present

## 2014-04-27 DIAGNOSIS — E119 Type 2 diabetes mellitus without complications: Secondary | ICD-10-CM | POA: Diagnosis not present

## 2014-04-27 LAB — GLUCOSE, CAPILLARY
GLUCOSE-CAPILLARY: 187 mg/dL — AB (ref 70–99)
GLUCOSE-CAPILLARY: 160 mg/dL — AB (ref 70–99)
Glucose-Capillary: 201 mg/dL — ABNORMAL HIGH (ref 70–99)
Glucose-Capillary: 176 mg/dL — ABNORMAL HIGH (ref 70–99)

## 2014-04-27 LAB — BASIC METABOLIC PANEL
Anion gap: 10 (ref 5–15)
BUN: 28 mg/dL — ABNORMAL HIGH (ref 6–23)
CALCIUM: 8.6 mg/dL (ref 8.4–10.5)
CO2: 24 meq/L (ref 19–32)
Chloride: 101 mEq/L (ref 96–112)
Creatinine, Ser: 1.5 mg/dL — ABNORMAL HIGH (ref 0.50–1.35)
GFR calc Af Amer: 53 mL/min — ABNORMAL LOW (ref >90)
GFR calc non Af Amer: 46 mL/min — ABNORMAL LOW (ref >90)
GLUCOSE: 162 mg/dL — AB (ref 70–99)
POTASSIUM: 4.7 meq/L (ref 3.7–5.3)
Sodium: 135 mEq/L — ABNORMAL LOW (ref 137–147)

## 2014-04-27 MED ORDER — INSULIN GLARGINE 100 UNIT/ML ~~LOC~~ SOLN: 8.0000 [IU] | Freq: Every day | SUBCUTANEOUS | Status: DC

## 2014-04-27 MED ORDER — INSULIN GLARGINE 100 UNIT/ML SOLOSTAR PEN: 8.0000 [IU] | PEN_INJECTOR | Freq: Every day | SUBCUTANEOUS | Status: DC

## 2014-04-27 MED ORDER — AMLODIPINE BESYLATE 10 MG PO TABS: 10.0000 mg | ORAL_TABLET | Freq: Every day | ORAL | Status: DC

## 2014-04-27 MED ORDER — LINAGLIPTIN 5 MG PO TABS: 5.0000 mg | ORAL_TABLET | Freq: Every day | ORAL | Status: DC

## 2014-04-27 MED ORDER — CITALOPRAM HYDROBROMIDE 20 MG PO TABS: 20.0000 mg | ORAL_TABLET | Freq: Every day | ORAL | Status: DC

## 2014-04-27 MED ORDER — INSULIN PEN NEEDLE 29G X 12MM MISC: 8.0000 [IU] | Freq: Every day | Status: DC

## 2014-04-27 MED ORDER — GLIMEPIRIDE 2 MG PO TABS: 2.0000 mg | ORAL_TABLET | Freq: Every day | ORAL | Status: DC

## 2014-04-27 MED ORDER — CARVEDILOL 6.25 MG PO TABS: 6.2500 mg | ORAL_TABLET | Freq: Two times a day (BID) | ORAL | Status: DC

## 2014-04-27 NOTE — Progress Notes (Signed)
PT TRANSPORTED FROM ROOM PER STRETCHER BY EMS. REPORT GIVEN TO FACILITY([facility]). LEFT ROOM IN NO ACUTE DISTRESS. NO SOB. ALERT ORIENTED. MIN ASSIST TRANSFER. IV REMOVED.

## 2014-04-27 NOTE — Care Management Note (Signed)
    Page 1 of 1   04/27/2014     5:04:36 PM CARE MANAGEMENT NOTE 04/27/2014  Patient:  Barry Haley, Barry Haley   Account Number:  [id number]  Date Initiated:  04/22/2014  Documentation initiated by:  Marvetta Gibbons  Subjective/Objective Assessment:   Pt admitted with DKA- insulin gtt and stabilizer     Action/Plan:   PTA pt lived at home with spouse   Anticipated DC Date:  04/26/2014   Anticipated DC Plan:  [place]  CM consult      Choice offered to / List presented to:             Status of service:  In process, will continue to follow Medicare Important Message given?  YES (If response is "NO", the following Medicare IM given date fields will be blank) Date Medicare IM given:  04/27/2014 Medicare IM given by:  Lorne Skeens Date Additional Medicare IM given:   Additional Medicare IM given by:    Discharge Disposition:  [place]  Per UR Regulation:  Reviewed for med. necessity/level of care/duration of stay  If discussed at [place] of Stay Meetings, dates discussed:    Comments:  04/27/14 [place], MSN, CM- Medicare IM letter provided.

## 2014-04-27 NOTE — Clinical Social Work Note (Signed)
CSW has faxed discharge summary to [facility]. Discharge packet completed and placed on pt's shadow chart. Pt's wife, Joelene Millin, updated regarding discharge on 04/27/2014. EMS [facility]) has been arranged for transportation.  [place]   Lubertha Sayres, MSW, [facility] Licensed Clinical Social Worker [phone] and [phone]

## 2014-04-27 NOTE — Progress Notes (Signed)
Physical Therapy Treatment Patient Details Name: Barry Haley MRN: [id number] DOB: December 04, 1945 Today's Date: 04/27/2014    History of Present Illness 68 y.o. male history hypertension, hyperlipidemia, diabetes mellitus and hemorrhagic stroke with aneurysm rupture, presenting with visual changes which started 10 days ago, as well as progressively worsening gait and increasing confusion. Patient was last known well 04/11/2014. He was subsequently seen by ophthalmology and was found to have left visual field defect and likely right occipital stroke. Patient stroke workup is in progress. NIH score 06 upon admission. Awaiting MRI.     PT Comments    Pt progressing very well with all aspects of mobility and is now ambulating at min/guard to close S level.  Note mild drifting to the R during gait, however did not note any field cut during functional mobility.  Pt to D/C to SNF today.   Follow Up Recommendations  SNF;Supervision/Assistance - 24 hour     Equipment Recommendations  None recommended by PT    Recommendations for Other Services       Precautions / Restrictions Precautions Precautions: Fall Precaution Comments: wife reports recent falls at home  Restrictions Weight Bearing Restrictions: No    Mobility  Bed Mobility Overal bed mobility: Modified Independent                Transfers Overall transfer level: Needs assistance Equipment used: None Transfers: Sit to/from Stand Sit to Stand: Supervision         General transfer comment: Pt initially with slight LOB posteriorly, but able to recover without assist.   Ambulation/Gait Ambulation/Gait assistance: Min guard;Supervision Ambulation Distance (Feet): 500 Feet Assistive device: None Gait Pattern/deviations: Step-through pattern;Drifts right/left     General Gait Details: Pt noted to have some drifting to the R during session, however did not note any field cut during gait and functional mobility.  Did not  run into objects and did well scanning environment.    Stairs Stairs: Yes Stairs assistance: Min guard Stair Management: One rail Right;Alternating pattern;Forwards Number of Stairs: 11 General stair comments: Pt requires min/guard for slight steadying and safety.   Wheelchair Mobility    Modified Rankin (Stroke Patients Only) Modified Rankin (Stroke Patients Only) Pre-Morbid Rankin Score: Moderately severe disability Modified Rankin: Moderately severe disability     Balance                                    Cognition Arousal/Alertness: Awake/alert Behavior During Therapy: WFL for tasks assessed/performed Overall Cognitive Status: Impaired/Different from baseline Area of Impairment: Attention;Memory;Following commands;Safety/judgement;Awareness;Problem solving   Current Attention Level: Selective Memory: Decreased short-term memory Following Commands: Follows one step commands consistently Safety/Judgement: Decreased awareness of safety;Decreased awareness of deficits Awareness: Emergent Problem Solving: Slow processing;Decreased initiation;Difficulty sequencing General Comments: continues to have some working memory and recall deficits during session.  Pt also very perseverative on [place] being so large.     Exercises      General Comments General comments (skin integrity, edema, etc.): Pt continues to have mild difficulties with head turns during gait, speed changes, and negotiation over objects.       Pertinent Vitals/Pain      Home Living                      Prior Function            PT Goals (current goals can now be found  in the care plan section) Acute Rehab PT Goals Patient Stated Goal: to be able to take care of myself PT Goal Formulation: With patient/family Time For Goal Achievement: 04/29/14 Potential to Achieve Goals: Good Progress towards PT goals: Progressing toward goals    Frequency  Min 4X/week    PT  Plan Current plan remains appropriate    Co-evaluation             End of Session Equipment Utilized During Treatment: Gait belt Activity Tolerance: Patient tolerated treatment well Patient left: in chair;with call bell/phone within reach     Time: 1329-1403 PT Time Calculation (min): 34 min  Charges:  $Gait Training: 8-22 mins $Neuromuscular Re-education: 8-22 mins                    G Codes:      Denice Bors 04/27/2014, 2:12 PM

## 2014-04-27 NOTE — Discharge Summary (Signed)
Physician Discharge Summary  Aaronjohn Burno [id number] DOB: 07-23-1946 DOA: 04/21/2014  PCP: [name], MD  Admit date: 04/21/2014 Discharge date: 04/27/2014  Time spent: 40 minutes  Recommendations for Outpatient Follow-up:   DKA/ Diabetes mellitus type 2, uncontrolled  -8/14 hemoglobin A1c=13.7  -Discharge on glimepiride 2 mg with breakfast -Lantus 8 units daily -Linagliptin 5 mg daily -ADA diet -PCP to monitor her blood glucose levels and make adjustments to medication.  Left hemianopsia  -Resolved   -MRI brain pending (spoke with [facility] anesthesia tech x [phone] patient will be sedated with propofol on 8/21) to obtain MRI; n.p.o. after midnight  -Carotid ultrasound pending  -Echocardiogram pending  -LDL 78    Abnormal EKG  -Although patient had abnormal EKG was asymptomatic, and cardiac enzymes were negative. No further workup was performed.  -   Hypertension  -Continue amlodipine 10 mg daily  -Continue Coreg 6.25 mg BID  Acute on Chronic renal failure (CKD2)  -Creatinine Still slightly elevated from his previous baseline 1.2 however did not was a value from 01/2010. 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  presents with one-week history of "floaters" in his visual field. Apparently, the patient went to see his ophthalmologist and was diagnosed with a left hemianopsia. and likely right occipital stroke.The patient was sent to neurology. He saw Dr. Erlinda Hong on 8/14 and outpt stroke workup was started, but he has not had an MRI. Unfortunately, the patient also has had increasing confusion that has worsened since 04/16/2014. He was noted to have hemoglobin A1c of 13.7 on 04/16/2014. He was instructed to go to his primary care physician whom he saw on the day of admission. His CBG was over 450 in the office, and he was sent to the emergency department. In addition, the patient has had gait instability with mechanical fall 2 days prior to this admission. There was no syncope. The patient is pleasantly confused presently, and is unable to provide any significant history. However he denies any fevers, chills, chest pain, shortness breath, nausea, vomiting, diarrhea, abdominal pain, dysuria, hematuria. 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 He had a full recovery.  In emergency department, the patient had serum glucose 480 anion gap of 15. He was given 1 L normal saline. MRI was ordered, but he was not able to tolerate it due to 2 phobia and agitation. Urinalysis was negative for pyuria. Urine drug screen and alcohol were negative. EKG shows sinus rhythm with ST depression V1 to V3. NIH stroke score was 6. During this hospitalization patient initially was having hallucinations (seeing people and animals that aren't there). This has since resolved. In addition patient was having left hemianopsia which has resolved. Patient was seen by  neurology was performed an EEG which was read as abnormal (see results below). In addition TEE was performed which revealed patient to have chronic systolic CHF (see results below). Patient also was evaluated for acute stroke, which  workup was negative. However his MRI did showstable Acom aneurysm measuring ~61mm. Because of this finding patient was seen by neurosurgery which felt that outpatient followup was appropriate.   Consultants:  Dr. Wallie Char (neurology) Dr.Neelesh Kathyrn Sheriff (neurosurgery)     Procedure/Significant Events:  01/24/2012 brain MRI; no significant stenosis of medium to large sized intracranial vessels.  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 No significant change compared with CT angiogram 11/13/2010  8/19 EEGseverely abnormal EEG due to the presence of severe background slowing indicative of bihemispheric dysfunction which is a nonspecific finding.  No definite epileptiform activity is noted. 8/20 TEE- LVEF; 30%-to 35%.  8/20 carotid Doppler;1-39% internal carotid artery stenosis bilaterally. The left vertebral artery is patent with antegrade flow. 8/21 MRI/MRA brain;Negative for acute infarct-Numerous areas of chronic hemorrhage in the brain. -Diffuse intracranial atherosclerotic disease without large vessel occlusion.  -2 mm aneurysm anterior communicating artery.  -Both carotid bifurcations are patent bilaterally  -Atherosclerotic disease in the proximal vertebral arteries. Focal area of signal loss in the proximal right vertebral artery may be due to severe stenosis or artifact. 8./23 bilateral lower extremity Doppler; negative for DVT   Culture  8/19 MRSA by PCR negative    Discharge Exam: Filed Vitals:   04/27/14 0220 04/27/14 0614 04/27/14 1017 04/27/14 1415  BP: 161/84 137/54 139/73 143/59  Pulse: 64 68 62 59  Temp: 98.1 F (36.7 C) 98.1 F (36.7 C) 98 F (36.7 C) 98.9 F (37.2 C)  TempSrc: Oral Oral Oral Oral  Resp: 16 18 18 18   Height:      Weight:      SpO2: 99% 99% 99% 100%   General: No acute respiratory distress  Lungs: Clear to auscultation bilaterally without wheezes or crackles  Cardiovascular:  Regular rate and rhythm without murmur gallop or rub  Abdomen: Nontender, nondistended, soft, bowel sounds positive, no rebound, no ascites, no appreciable mass  Extremities: No significant cyanosis, clubbing, or edema bilateral lower extremities   Discharge Instructions     Medication List    STOP taking these medications       ALPRAZolam 0.5 MG tablet  Commonly known as:  XANAX     diltiazem 240 MG 24 hr capsule  Commonly known as:  CARDIZEM CD     furosemide 40 MG tablet  Commonly known as:  LASIX     sitaGLIPtin 50 MG tablet  Commonly known as:  JANUVIA  Replaced by:  linagliptin 5 MG Tabs tablet      TAKE these medications       amLODipine 10 MG tablet  Commonly known as:  NORVASC  Take 1 tablet (10 mg total) by mouth daily.     aspirin 325 MG tablet  Take 1 tablet (325 mg total) by mouth daily.     atorvastatin 10 MG tablet  Commonly known as:  LIPITOR  Take 10 mg by mouth at bedtime.     carvedilol 6.25 MG tablet  Commonly known as:  COREG  Take 1 tablet (6.25 mg total) by mouth 2 (two) times daily with a meal.     citalopram 20 MG tablet  Commonly known as:  CELEXA  Take 1 tablet (20 mg total) by mouth daily.  clonazePAM 0.5 MG tablet  Commonly known as:  KLONOPIN  Take 1 tablet (0.5 mg total) by mouth every 8 (eight) hours as needed.     fluticasone 27.5 MCG/SPRAY nasal spray  Commonly known as:  VERAMYST  Place 2 sprays into the nose daily.     glimepiride 2 MG tablet  Commonly known as:  AMARYL  Take 1 tablet (2 mg total) by mouth daily with breakfast.     Insulin Glargine 100 UNIT/ML Solostar Pen  Commonly known as:  LANTUS SOLOSTAR  Inject 8 Units into the skin daily at 10 pm.     Insulin Pen Needle 29G X 12MM Misc  Commonly known as:  AURORA PEN NEEDLES  8 Units by Does not apply route daily.     linagliptin 5 MG Tabs tablet  Commonly known as:  TRADJENTA  Take 1 tablet (5 mg total) by mouth daily.     NEXIUM 40 MG capsule   Generic drug:  esomeprazole  Take 40 mg by mouth daily.     tamsulosin 0.4 MG Caps capsule  Commonly known as:  FLOMAX  Take 0.4 mg by mouth 2 (two) times daily.     vitamin B-12 1000 MCG tablet  Commonly known as:  CYANOCOBALAMIN  Take 1,000 mcg by mouth daily.      ASK your doctor about these medications       metoprolol succinate 50 MG 24 hr tablet  Commonly known as:  TOPROL-XL  Take 50 mg by mouth 2 (two) times daily. Take with or immediately following a meal.       Allergies  Allergen Reactions  . Hctz [Hydrochlorothiazide] Other (See Comments)    Renal insufficiency  . Niaspan [Niacin Er] Itching  . Zolpidem Anxiety and Other (See Comments)    Bad dreams   Follow-up Information   Follow up with [facility] Angelique Blonder., MD. Schedule an appointment as soon as possible for a visit in 1 week. [facility] followup; uncontrolled diabetes, hypertension high cholesterol)    Specialty:  Family Medicine   Contact information:   [place] [address] [phone]       Follow up with Darden Amber., MD In 2 weeks. [facility] followup; abnormal EKG, uncontrolled diabetes, hypertension)    Specialty:  Cardiology   Contact information:   [place] [address] [phone]        The results of significant diagnostics from this hospitalization (including imaging, microbiology, ancillary and laboratory) are listed below for reference.    Significant Diagnostic Studies: Dg Chest 2 View  04/22/2014   CLINICAL DATA:  Stroke.  No chest complaints  EXAM: CHEST  2 VIEW  COMPARISON:  01/21/2013  FINDINGS: Cardiomegaly which is decreased from 2014. There has been median sternotomy for mitral and tricuspid annuloplasty. Negative upper mediastinal contours. There is no edema, consolidation, effusion, or pneumothorax.  IMPRESSION: No active cardiopulmonary disease.   Electronically Signed   By: Jorje Guild M.D.   On: 04/22/2014 01:47    Ct Head Wo Contrast  04/21/2014   CLINICAL DATA:  Confusion, gait instability.  EXAM: CT HEAD WITHOUT CONTRAST  TECHNIQUE: Contiguous axial images were obtained from the base of the skull through the vertex without intravenous contrast.  COMPARISON:  11/13/2010.  FINDINGS: No evidence of an acute infarct, acute hemorrhage, mass lesion, mass effect or hydrocephalus. Atrophy. Periventricular low attenuation. Small area of encephalomalacia in the posterior left parietal lobe (image 24). There may  be a small remote infarct in the left cerebellum. Retention cyst or polyp in the right maxillary sinus. Mastoid air cells are clear.  IMPRESSION: 1. No acute intracranial abnormality. 2. Atrophy and chronic microvascular white matter ischemic changes.   Electronically Signed   By: Lorin Picket M.D.   On: 04/21/2014 18:22   Mr Angiogram Neck W Wo Contrast  04/23/2014   CLINICAL DATA:  Stroke. Hypertension, hyperlipidemia, diabetes. Visual change.  EXAM: MRI HEAD WITHOUT AND WITH CONTRAST  MRA HEAD WITHOUT CONTRAST  MRA NECK WITHOUT AND WITH CONTRAST  TECHNIQUE: Multiplanar, multiecho pulse sequences of the brain and surrounding structures were obtained without and with intravenous contrast. Angiographic images of the Circle of Willis were obtained using MRA technique without intravenous contrast. Angiographic images of the neck were obtained using MRA technique without and with intravenous contrast. Carotid stenosis measurements (when applicable) are obtained utilizing NASCET criteria, using the distal internal carotid diameter as the denominator.  CONTRAST:  68mL MULTIHANCE GADOBENATE DIMEGLUMINE 529 MG/ML IV SOLN  COMPARISON:  CT 04/21/2014  FINDINGS: MRI HEAD FINDINGS  Mild atrophy. Mild ventricular enlargement secondary to atrophy. Small chronic infarct in the left parietal lobe. Small chronic infarcts in the occipital lobes bilaterally.  Negative for acute infarct.  Numerous areas of chronic hemorrhage in the  brain including the left superior cerebellum, the occipital lobe bilaterally right greater than left, in the frontal and parietal lobes. This may be due to hypertension. Cerebral amyloid could also cause multiple areas of hemorrhage.  Postcontrast imaging reveals no enhancing mass lesion. Normal enhancement.  MRA HEAD FINDINGS  Both vertebral arteries are patent to the basilar with mild atherosclerotic disease distally. The basilar shows mild atherosclerotic disease but is widely patent. Posterior cerebral arteries show mild atherosclerotic disease but are adequately patent. Cerebellar arteries appear diffusely diseased is well.  Cavernous carotid shows diffuse atherosclerotic irregularity and mild to moderate stenosis bilaterally. Diffuse atherosclerotic disease in the middle cerebral arteries bilaterally with mild diffuse stenosis in the M1 segments. Right A1 segment is hypoplastic. Both anterior cerebral arteries are supplied from the left.  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 No other aneurysm.  MRA NECK FINDINGS  Standard branching of the aortic arch. Proximal great vessels are patent without significant stenosis  Decreased signal in the proximal right common carotid artery is probably artifact from pulsation however atherosclerotic disease and significant stenosis in this area not excluded. Right carotid bifurcation is widely patent without significant stenosis.  Left common carotid artery is widely patent. Left carotid bifurcation is widely patent.  Both vertebral arteries are patent to the basilar. There is atherosclerotic irregularity in the proximal left vertebral artery. There is a moderate to severe stenosis at the right vertebral origin. This may be partially due to artifact.  IMPRESSION: Negative for acute infarct  Numerous areas of chronic hemorrhage in the brain. Question hypertension versus cerebral amyloid.  Diffuse intracranial atherosclerotic disease without large vessel  occlusion.  2 mm aneurysm anterior communicating artery.  Question severe stenosis proximal right common carotid artery versus artifact. Favor artifact. Both carotid bifurcations are patent bilaterally  Atherosclerotic disease in the proximal vertebral arteries. Focal area of signal loss in the proximal right vertebral artery may be due to severe stenosis or artifact.   Electronically Signed   By: Franchot Gallo M.D.   On: 04/23/2014 15:24   Mr Jeri Cos [id number] Contrast  04/23/2014   CLINICAL DATA:  Stroke. Hypertension, hyperlipidemia, diabetes. Visual change.  EXAM: MRI HEAD  WITHOUT AND WITH CONTRAST  MRA HEAD WITHOUT CONTRAST  MRA NECK WITHOUT AND WITH CONTRAST  TECHNIQUE: Multiplanar, multiecho pulse sequences of the brain and surrounding structures were obtained without and with intravenous contrast. Angiographic images of the Circle of Willis were obtained using MRA technique without intravenous contrast. Angiographic images of the neck were obtained using MRA technique without and with intravenous contrast. Carotid stenosis measurements (when applicable) are obtained utilizing NASCET criteria, using the distal internal carotid diameter as the denominator.  CONTRAST:  59mL MULTIHANCE GADOBENATE DIMEGLUMINE 529 MG/ML IV SOLN  COMPARISON:  CT 04/21/2014  FINDINGS: MRI HEAD FINDINGS  Mild atrophy. Mild ventricular enlargement secondary to atrophy. Small chronic infarct in the left parietal lobe. Small chronic infarcts in the occipital lobes bilaterally.  Negative for acute infarct.  Numerous areas of chronic hemorrhage in the brain including the left superior cerebellum, the occipital lobe bilaterally right greater than left, in the frontal and parietal lobes. This may be due to hypertension. Cerebral amyloid could also cause multiple areas of hemorrhage.  Postcontrast imaging reveals no enhancing mass lesion. Normal enhancement.  MRA HEAD FINDINGS  Both vertebral arteries are patent to the basilar with mild  atherosclerotic disease distally. The basilar shows mild atherosclerotic disease but is widely patent. Posterior cerebral arteries show mild atherosclerotic disease but are adequately patent. Cerebellar arteries appear diffusely diseased is well.  Cavernous carotid shows diffuse atherosclerotic irregularity and mild to moderate stenosis bilaterally. Diffuse atherosclerotic disease in the middle cerebral arteries bilaterally with mild diffuse stenosis in the M1 segments. Right A1 segment is hypoplastic. Both anterior cerebral arteries are supplied from the left.  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 No other aneurysm.  MRA NECK FINDINGS  Standard branching of the aortic arch. Proximal great vessels are patent without significant stenosis  Decreased signal in the proximal right common carotid artery is probably artifact from pulsation however atherosclerotic disease and significant stenosis in this area not excluded. Right carotid bifurcation is widely patent without significant stenosis.  Left common carotid artery is widely patent. Left carotid bifurcation is widely patent.  Both vertebral arteries are patent to the basilar. There is atherosclerotic irregularity in the proximal left vertebral artery. There is a moderate to severe stenosis at the right vertebral origin. This may be partially due to artifact.  IMPRESSION: Negative for acute infarct  Numerous areas of chronic hemorrhage in the brain. Question hypertension versus cerebral amyloid.  Diffuse intracranial atherosclerotic disease without large vessel occlusion.  2 mm aneurysm anterior communicating artery.  Question severe stenosis proximal right common carotid artery versus artifact. Favor artifact. Both carotid bifurcations are patent bilaterally  Atherosclerotic disease in the proximal vertebral arteries. Focal area of signal loss in the proximal right vertebral artery may be due to severe stenosis or artifact.   Electronically Signed    By: Franchot Gallo M.D.   On: 04/23/2014 15:24   Mr Jodene Nam Head/brain Wo Cm  04/23/2014   CLINICAL DATA:  Stroke. Hypertension, hyperlipidemia, diabetes. Visual change.  EXAM: MRI HEAD WITHOUT AND WITH CONTRAST  MRA HEAD WITHOUT CONTRAST  MRA NECK WITHOUT AND WITH CONTRAST  TECHNIQUE: Multiplanar, multiecho pulse sequences of the brain and surrounding structures were obtained without and with intravenous contrast. Angiographic images of the Circle of Willis were obtained using MRA technique without intravenous contrast. Angiographic images of the neck were obtained using MRA technique without and with intravenous contrast. Carotid stenosis measurements (when applicable) are obtained utilizing NASCET criteria, using the distal internal carotid  diameter as the denominator.  CONTRAST:  35mL MULTIHANCE GADOBENATE DIMEGLUMINE 529 MG/ML IV SOLN  COMPARISON:  CT 04/21/2014  FINDINGS: MRI HEAD FINDINGS  Mild atrophy. Mild ventricular enlargement secondary to atrophy. Small chronic infarct in the left parietal lobe. Small chronic infarcts in the occipital lobes bilaterally.  Negative for acute infarct.  Numerous areas of chronic hemorrhage in the brain including the left superior cerebellum, the occipital lobe bilaterally right greater than left, in the frontal and parietal lobes. This may be due to hypertension. Cerebral amyloid could also cause multiple areas of hemorrhage.  Postcontrast imaging reveals no enhancing mass lesion. Normal enhancement.  MRA HEAD FINDINGS  Both vertebral arteries are patent to the basilar with mild atherosclerotic disease distally. The basilar shows mild atherosclerotic disease but is widely patent. Posterior cerebral arteries show mild atherosclerotic disease but are adequately patent. Cerebellar arteries appear diffusely diseased is well.  Cavernous carotid shows diffuse atherosclerotic irregularity and mild to moderate stenosis bilaterally. Diffuse atherosclerotic disease in the middle  cerebral arteries bilaterally with mild diffuse stenosis in the M1 segments. Right A1 segment is hypoplastic. Both anterior cerebral arteries are supplied from the left.  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 No other aneurysm.  MRA NECK FINDINGS  Standard branching of the aortic arch. Proximal great vessels are patent without significant stenosis  Decreased signal in the proximal right common carotid artery is probably artifact from pulsation however atherosclerotic disease and significant stenosis in this area not excluded. Right carotid bifurcation is widely patent without significant stenosis.  Left common carotid artery is widely patent. Left carotid bifurcation is widely patent.  Both vertebral arteries are patent to the basilar. There is atherosclerotic irregularity in the proximal left vertebral artery. There is a moderate to severe stenosis at the right vertebral origin. This may be partially due to artifact.  IMPRESSION: Negative for acute infarct  Numerous areas of chronic hemorrhage in the brain. Question hypertension versus cerebral amyloid.  Diffuse intracranial atherosclerotic disease without large vessel occlusion.  2 mm aneurysm anterior communicating artery.  Question severe stenosis proximal right common carotid artery versus artifact. Favor artifact. Both carotid bifurcations are patent bilaterally  Atherosclerotic disease in the proximal vertebral arteries. Focal area of signal loss in the proximal right vertebral artery may be due to severe stenosis or artifact.   Electronically Signed   By: Franchot Gallo M.D.   On: 04/23/2014 15:24    Microbiology: Recent Results (from the past 240 hour(s))  MRSA PCR SCREENING     Status: None   Collection Time    04/21/14  7:10 PM      Result Value Ref Range Status   MRSA by PCR NEGATIVE  NEGATIVE Final   Comment:            The GeneXpert MRSA Assay (FDA     approved for NASAL specimens     only), is one component of a      comprehensive MRSA colonization     surveillance program. It is not     intended to diagnose MRSA     infection nor to guide or     monitor treatment for     MRSA infections.     Labs: Basic Metabolic Panel:  Recent Labs Lab 04/23/14 0340 04/24/14 0300 04/25/14 0336 04/26/14 0400 04/27/14 0550  NA 134* 137 134* 135* 135*  K 4.3 4.8 4.6 4.7 4.7  CL 101 104 101 103 101  CO2 23 24 25 23  24  GLUCOSE 126* 187* 206* 175* 162*  BUN 37* 28* 29* 28* 28*  CREATININE 1.87* 1.52* 1.61* 1.48* 1.50*  CALCIUM 8.8 8.8 8.7 8.6 8.6  MG 2.2  --   --   --   --    Liver Function Tests:  Recent Labs Lab 04/21/14 1332 04/23/14 0340  AST 13 15  ALT 13 15  ALKPHOS 134* 113  BILITOT 0.5 0.4  PROT 6.6 5.9*  ALBUMIN 2.9* 2.6*   No results found for this basename: LIPASE, AMYLASE,  in the last 168 hours No results found for this basename: AMMONIA,  in the last 168 hours CBC:  Recent Labs Lab 04/21/14 1332 04/21/14 1951 04/23/14 0340  WBC 8.2 6.8 6.7  NEUTROABS 6.5  --  4.5  HGB 12.1* 11.6* 11.7*  HCT 35.5* 34.8* 36.5*  MCV 71.1* 73.3* 75.6*  PLT 145* 140* 136*   Cardiac Enzymes:  Recent Labs Lab 04/21/14 1951 04/22/14 0040 04/22/14 0835  TROPONINI <0.30 <0.30 <0.30   BNP: BNP (last 3 results) No results found for this basename: PROBNP,  in the last 8760 hours CBG:  Recent Labs Lab 04/26/14 1148 04/26/14 1701 04/26/14 1950 04/27/14 0637 04/27/14 1135  GLUCAP 170* 186* 187* 160* 201*       Signed:  Dia Crawford, MD [facility] [phone] pager

## 2014-04-30 ENCOUNTER — Telehealth: Payer: Self-pay | Admitting: Critical Care Medicine

## 2014-04-30 DIAGNOSIS — R5381 Other malaise: Secondary | ICD-10-CM | POA: Diagnosis not present

## 2014-04-30 DIAGNOSIS — I10 Essential (primary) hypertension: Secondary | ICD-10-CM | POA: Diagnosis not present

## 2014-04-30 DIAGNOSIS — N179 Acute kidney failure, unspecified: Secondary | ICD-10-CM | POA: Diagnosis not present

## 2014-04-30 DIAGNOSIS — IMO0001 Reserved for inherently not codable concepts without codable children: Secondary | ICD-10-CM | POA: Diagnosis not present

## 2014-04-30 NOTE — Telephone Encounter (Signed)
ONO on RA + cpap.  NORMAL.   No oxygen needed with cpap

## 2014-04-30 NOTE — Telephone Encounter (Signed)
lmomtcb on pt's home and cell #s

## 2014-05-03 NOTE — Telephone Encounter (Signed)
Called, spoke with pt's wife.  Informed her of results and recs per Dr. Joya Gaskins.  She verbalized understanding, and will inform pt.  She will have him call office back if he has further questions or if any further assistance is needed.

## 2014-05-04 ENCOUNTER — Telehealth: Payer: Self-pay | Admitting: Critical Care Medicine

## 2014-05-04 NOTE — Telephone Encounter (Signed)
Per 04/30/14 phone note, crystal has already spoken w/ spouse.  Called spoke with spouse. She did not remember where crystal was from and does recall speaking with her already. Nothing further needed

## 2014-05-07 DIAGNOSIS — E785 Hyperlipidemia, unspecified: Secondary | ICD-10-CM | POA: Diagnosis not present

## 2014-05-07 DIAGNOSIS — I674 Hypertensive encephalopathy: Secondary | ICD-10-CM | POA: Diagnosis not present

## 2014-05-07 DIAGNOSIS — R5381 Other malaise: Secondary | ICD-10-CM | POA: Diagnosis not present

## 2014-05-07 DIAGNOSIS — I5032 Chronic diastolic (congestive) heart failure: Secondary | ICD-10-CM | POA: Diagnosis not present

## 2014-05-12 DIAGNOSIS — I10 Essential (primary) hypertension: Secondary | ICD-10-CM | POA: Diagnosis not present

## 2014-05-12 DIAGNOSIS — E1129 Type 2 diabetes mellitus with other diabetic kidney complication: Secondary | ICD-10-CM | POA: Diagnosis not present

## 2014-05-12 DIAGNOSIS — N058 Unspecified nephritic syndrome with other morphologic changes: Secondary | ICD-10-CM | POA: Diagnosis not present

## 2014-05-20 DIAGNOSIS — H53469 Homonymous bilateral field defects, unspecified side: Secondary | ICD-10-CM | POA: Diagnosis not present

## 2014-05-27 ENCOUNTER — Encounter: Payer: Self-pay | Admitting: Neurology

## 2014-05-27 ENCOUNTER — Ambulatory Visit (INDEPENDENT_AMBULATORY_CARE_PROVIDER_SITE_OTHER): Payer: Medicare Other | Admitting: Neurology

## 2014-05-27 ENCOUNTER — Ambulatory Visit: Payer: Medicare Other | Attending: Neurology | Admitting: Occupational Therapy

## 2014-05-27 VITALS — BP 125/54 | HR 63 | Ht 69.0 in | Wt 233.8 lb

## 2014-05-27 DIAGNOSIS — M6281 Muscle weakness (generalized): Secondary | ICD-10-CM | POA: Diagnosis not present

## 2014-05-27 DIAGNOSIS — E1110 Type 2 diabetes mellitus with ketoacidosis without coma: Secondary | ICD-10-CM | POA: Insufficient documentation

## 2014-05-27 DIAGNOSIS — R41842 Visuospatial deficit: Secondary | ICD-10-CM | POA: Diagnosis not present

## 2014-05-27 DIAGNOSIS — I671 Cerebral aneurysm, nonruptured: Secondary | ICD-10-CM

## 2014-05-27 DIAGNOSIS — E1310 Other specified diabetes mellitus with ketoacidosis without coma: Secondary | ICD-10-CM | POA: Diagnosis not present

## 2014-05-27 DIAGNOSIS — I6340 Cerebral infarction due to embolism of unspecified cerebral artery: Secondary | ICD-10-CM | POA: Diagnosis not present

## 2014-05-27 DIAGNOSIS — IMO0002 Reserved for concepts with insufficient information to code with codable children: Secondary | ICD-10-CM

## 2014-05-27 DIAGNOSIS — E1165 Type 2 diabetes mellitus with hyperglycemia: Secondary | ICD-10-CM

## 2014-05-27 DIAGNOSIS — R279 Unspecified lack of coordination: Secondary | ICD-10-CM | POA: Insufficient documentation

## 2014-05-27 DIAGNOSIS — IMO0001 Reserved for inherently not codable concepts without codable children: Secondary | ICD-10-CM | POA: Insufficient documentation

## 2014-05-27 DIAGNOSIS — E118 Type 2 diabetes mellitus with unspecified complications: Principal | ICD-10-CM

## 2014-05-27 HISTORY — DX: Type 2 diabetes mellitus with hyperglycemia: E11.65

## 2014-05-27 HISTORY — DX: Reserved for concepts with insufficient information to code with codable children: IMO0002

## 2014-05-27 HISTORY — DX: Type 2 diabetes mellitus with ketoacidosis without coma: E11.10

## 2014-05-27 NOTE — Progress Notes (Signed)
STROKE NEUROLOGY FOLLOW UP NOTE  NAME: Barry Haley DOB: 21-Dec-1945  REASON FOR VISIT: stroke follow up HISTORY FROM: chart  Today we had the pleasure of seeing Barry Haley in follow-up at our Neurology Clinic. Pt was accompanied by wife.   History Summary For detailed info please refer to my note on 04/16/14.  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 In 2014, he again developed sepsis with bacteremia and TEE showed endocarditis. He had MV and TV repair in [facility]. Recently developed CHF and followed up with Dr. Bridgett Larsson in [facility] cardiology. He started to have left hemianopia and left visual disturbance mid August and concerns for a new stroke. During last clinic visit, I ordered MRI and TEE and EEG. However, before these being done, he was admitted to [facility] due to worsening generalized weakness and confusion. Found to have DM and DKA and A1C 13.7 as well as AKI. He was started on insulin drip and then subq. His MRI done should no acute stroke and TEE showed PFO and EF 35-40% without vegetations, EEG negative for seizure. His visual disturbance was considered as recrudescence of previous right occipital hemorrhagic infarct in 2011. He was continued on ASA and sent to CIR.   Interval History During the interval time, the patient has been doing well. He has OT to follow twice a week. His visual disturbance is gone. Blood sugar at home 110-120s. BP stable today 125/54. He is on insulin subq for DM control. He has PCP to see on 10/7 and cardiologist to f/u on 06/10/14. 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    REVIEW OF SYSTEMS: Full 14 system review of systems performed and notable only for those listed below and in HPI above, all others are negative:  Constitutional: N/A  Cardiovascular: N/A  Ear/Nose/Throat: N/A  Skin: N/A    Eyes: eye discharge  Respiratory: N/A  Gastroitestinal: N/A  Genitourinary: urinary urgency Hematology/Lymphatic: anemia  Endocrine: excessive thirst and eating  Musculoskeletal: N/A  Allergy/Immunology: N/A  Neurological: restless leg, apnea, memory loss Psychiatric: N/A  The following represents the patient's updated allergies and side effects list: Allergies  Allergen Reactions  . Hctz [Hydrochlorothiazide] Other (See Comments)    Renal insufficiency  . Niaspan [Niacin Er] Itching  . Zolpidem Anxiety and Other (See Comments)    Bad dreams    Labs since last visit of relevance include the following: Results for orders placed in visit on 05/06/14  PULMONARY FUNCTION TEST      Result Value Ref Range   FEV1       FVC       FEV1/FVC       TLC       DLCO        The neurologically relevant items on the patient's problem list were reviewed on today's visit.  Neurologic Examination  A problem focused neurological exam (12 or more points of the single system neurologic examination, vital signs counts as 1 point, cranial nerves count for 8 points) was performed.  Blood pressure 125/54, pulse 63, height 5\' 9"  (1.753 m), weight 233 lb 12.8 oz (106.051 kg).  General - Well nourished, well developed, in no apparent distress.  Ophthalmologic - Sharp disc margins OU.  Cardiovascular - Regular rate and rhythm with no murmur.  Mental Status -  Level of arousal and orientation to time,  place, and person were intact. Language including expression, naming, repetition, comprehension was assessed and found intact. Attention span and concentration were impaired. Fund of Knowledge was assessed and was impaired.  Cranial Nerves II - XII - II - Visual field intact OU. III, IV, VI - Extraocular movements intact. V - Facial sensation intact bilaterally. VII - Facial movement intact bilaterally. VIII - Hearing & vestibular intact bilaterally. X - Palate elevates symmetrically. XI -  Chin turning & shoulder shrug intact bilaterally. XII - Tongue protrusion intact.  Motor Strength - The patient's strength was normal in all extremities and pronator drift was absent.  Bulk was normal and fasciculations were absent.   Motor Tone - Muscle tone was assessed at the neck and appendages and was normal.  Reflexes - The patient's reflexes were normal in all extremities and he had no pathological reflexes.  Sensory - Light touch, temperature/pinprick were assessed and were normal.    Coordination - The patient had normal movements in the hands and feet with no ataxia or dysmetria.  Tremor was absent.  Gait and Station - large based and slow gait were observed as normal.  Data reviewed: I personally reviewed the images and agree with the radiology interpretations.  Ct Head Wo Contrast  04/21/2014 IMPRESSION: 1. No acute intracranial abnormality. 2. Atrophy and chronic microvascular white matter ischemic changes.  CUS 01/2013  This is an abnormal carotid duplex ultrasound with color flow imaging. .  There is 50-75% stenosis of the left ICA by Doppler criteria, although this  is likely at the low end of the range. No severe luminal narrowing noted,  although there is moderate plaque. The Vertebrals demonstrate foward flow  bilaterally . Insonation at multiple depths was attempted of the  [facility]. This is an abnormal Transcranial  Doppler Ultrasound examination. The right ACA velocity is slightly elevated,  but does not meet criteria for stenosis and most likely reflects hyperemia or  collateral flow. The resistance is increased in multiple vessels, which may  point to diffuse atherosclerosis or distal small vessel disease. However,  there is no evidence of stenosis, vasospasm, or flow diversion. There was no  previous study available for direct comparison.  CUS 04/2014 - 1-39% internal carotid artery stenosis bilaterally. Vertebral arteries are  patent with antegrade flow.  TEE - Impression: No vegetations seen EF 35-40%  + PFO ( markedly positive)  He is bradycardic and LV function is moderately reduced.  I have DCd his Diltiazem for now..  Transthoracic echo to be done.  TTE - Left ventricle: Systolic function was moderately to severely reduced. The estimated ejection fraction was in the range of 30% to 35%. - Aortic valve: No evidence of vegetation. - Mitral valve: No evidence of vegetation. - Tricuspid valve: No evidence of vegetation.  EEG - Summary This is a severely abnormal EEG due to the presence of severe background slowing indicative of bihemispheric dysfunction which is a nonspecific finding seen in a variety of degenerative, toxic, metabolic and ischemic etiologies. No definite epileptiform activity is noted.  MRI - Negative for acute infarct  Numerous areas of chronic hemorrhage in the brain. Question  hypertension versus cerebral amyloid.  Diffuse intracranial atherosclerotic disease without large vessel  occlusion.  2 mm aneurysm anterior communicating artery.  Question severe stenosis proximal right common carotid artery versus  artifact. Favor artifact. Both carotid bifurcations are patent  bilaterally  Atherosclerotic disease in the proximal vertebral arteries. Focal  area of signal loss in the  proximal right vertebral artery may be  due to severe stenosis or artifact.   MRI head and MRA neck and head 04/23/14 Negative for acute infarct d Numerous areas of chronic hemorrhage in the brain. Question  hypertension versus cerebral amyloid.  Diffuse intracranial atherosclerotic disease without large vessel  occlusion.  2 mm aneurysm anterior communicating artery.  Question severe stenosis proximal right common carotid artery versus  artifact. Favor artifact. Both carotid bifurcations are patent  bilaterally  Atherosclerotic disease in the proximal vertebral arteries. Focal  area of signal loss in the  proximal right vertebral artery may be  due to severe stenosis or artifact.   Component     Latest Ref Rng 04/16/2014 04/24/2014  Cholesterol, Total     100 - 199 mg/dL 142   Triglycerides     0 - 149 mg/dL 196 (H)   HDL     >39 mg/dL 25 (L)   VLDL Cholesterol Cal     5 - 40 mg/dL 39   LDL (calc)     0 - 99 mg/dL 78   Total CHOL/HDL Ratio     0.0 - 5.0 ratio units 5.7 (H)   TSH     0.450 - 4.500 uIU/mL 1.400   Free T4     0.82 - 1.77 ng/dL 1.56   Hemoglobin A1C     4.8 - 5.6 % 13.7 (H)   Estimated average glucose      346   Vitamin B-12     211 - 911 pg/mL  1640 (H)    Assessment: As you may recall, he is a 68 y.o. 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 MRI negative for acute stroke, TEE no vegetation and EEG no seizure. His neuro presentation was thought to be recrudescence of his old right occipital stroke. He recovered well after glucose undercontrol.  Plan:  - continue ASA and liptor for stroke prevention. - monitor BP and glucose at home - follow up with PCP for stroke risk factor modification - continue OT therapy - compliance with CPAP machine for OSA at night - will refer to Dr. Kathyrn Sheriff for cerebral aneurysm. - f/u in 4 months.  Orders Placed This Encounter  Procedures  . Ambulatory referral to Neurosurgery    Referral Priority:  Routine    Referral Type:  Surgical    Referral Reason:  Specialty Services Required    Requested Specialty:  Neurosurgery    Number of Visits Requested:  1    Meds ordered this encounter  Medications  . hydrALAZINE (APRESOLINE) 10 MG tablet    Sig: Take 5 mg by mouth 3 (three) times daily.     Patient Instructions  - continue ASA and lipitor for stroke prevention - continue monitor your BP and  glucose at home - continue follow up with PCP next month for stroke risk factor modification. Bring your medication list and medications for the visit. - follow up with OT closely - will refer you to see Dr. Kathyrn Sheriff for your cerebral aneurysm.  - follow up in 4 months.    Rosalin Hawking, MD PhD [facility] [address] [place], [place] [address] [phone]

## 2014-05-27 NOTE — Patient Instructions (Signed)
-   continue ASA and lipitor for stroke prevention - continue monitor your BP and glucose at home - continue follow up with PCP next month for stroke risk factor modification. Bring your medication list and medications for the visit. - follow up with OT closely - will refer you to see Dr. Kathyrn Sheriff for your cerebral aneurysm.  - follow up in 4 months.

## 2014-06-01 ENCOUNTER — Encounter: Payer: Medicare Other | Admitting: Cardiovascular Disease

## 2014-06-01 ENCOUNTER — Ambulatory Visit: Payer: Medicare Other | Admitting: Occupational Therapy

## 2014-06-01 DIAGNOSIS — M6281 Muscle weakness (generalized): Secondary | ICD-10-CM | POA: Diagnosis not present

## 2014-06-01 DIAGNOSIS — R279 Unspecified lack of coordination: Secondary | ICD-10-CM | POA: Diagnosis not present

## 2014-06-01 DIAGNOSIS — R41842 Visuospatial deficit: Secondary | ICD-10-CM | POA: Diagnosis not present

## 2014-06-01 DIAGNOSIS — IMO0001 Reserved for inherently not codable concepts without codable children: Secondary | ICD-10-CM | POA: Diagnosis not present

## 2014-06-03 ENCOUNTER — Encounter: Payer: Medicare Other | Admitting: Occupational Therapy

## 2014-06-04 DIAGNOSIS — H53469 Homonymous bilateral field defects, unspecified side: Secondary | ICD-10-CM | POA: Diagnosis not present

## 2014-06-07 ENCOUNTER — Ambulatory Visit: Payer: Medicare Other | Admitting: Occupational Therapy

## 2014-06-08 ENCOUNTER — Ambulatory Visit: Payer: Medicare Other | Admitting: Occupational Therapy

## 2014-06-09 ENCOUNTER — Encounter: Payer: Medicare Other | Admitting: Occupational Therapy

## 2014-06-09 DIAGNOSIS — J302 Other seasonal allergic rhinitis: Secondary | ICD-10-CM | POA: Diagnosis not present

## 2014-06-09 DIAGNOSIS — E1165 Type 2 diabetes mellitus with hyperglycemia: Secondary | ICD-10-CM | POA: Diagnosis not present

## 2014-06-09 DIAGNOSIS — F419 Anxiety disorder, unspecified: Secondary | ICD-10-CM | POA: Diagnosis not present

## 2014-06-09 DIAGNOSIS — Z23 Encounter for immunization: Secondary | ICD-10-CM | POA: Diagnosis not present

## 2014-06-14 ENCOUNTER — Encounter: Payer: Medicare Other | Admitting: Occupational Therapy

## 2014-06-17 ENCOUNTER — Encounter: Payer: Medicare Other | Admitting: Occupational Therapy

## 2014-06-29 ENCOUNTER — Observation Stay (HOSPITAL_COMMUNITY)
Admission: EM | Admit: 2014-06-29 | Discharge: 2014-06-30 | Disposition: A | Discharge status: Home / Self Care | Payer: Medicare Other | Attending: Family Medicine | Admitting: Family Medicine

## 2014-06-29 ENCOUNTER — Emergency Department (HOSPITAL_COMMUNITY): Payer: Medicare Other

## 2014-06-29 ENCOUNTER — Encounter (HOSPITAL_COMMUNITY): Payer: Self-pay | Admitting: Emergency Medicine

## 2014-06-29 DIAGNOSIS — J811 Chronic pulmonary edema: Secondary | ICD-10-CM | POA: Diagnosis not present

## 2014-06-29 DIAGNOSIS — R0602 Shortness of breath: Secondary | ICD-10-CM

## 2014-06-29 DIAGNOSIS — N189 Chronic kidney disease, unspecified: Secondary | ICD-10-CM

## 2014-06-29 DIAGNOSIS — Z9889 Other specified postprocedural states: Secondary | ICD-10-CM

## 2014-06-29 DIAGNOSIS — R079 Chest pain, unspecified: Secondary | ICD-10-CM

## 2014-06-29 DIAGNOSIS — I5021 Acute systolic (congestive) heart failure: Secondary | ICD-10-CM | POA: Diagnosis not present

## 2014-06-29 DIAGNOSIS — G510 Bell's palsy: Secondary | ICD-10-CM

## 2014-06-29 DIAGNOSIS — I272 Other secondary pulmonary hypertension: Secondary | ICD-10-CM | POA: Diagnosis not present

## 2014-06-29 DIAGNOSIS — E1129 Type 2 diabetes mellitus with other diabetic kidney complication: Secondary | ICD-10-CM | POA: Diagnosis present

## 2014-06-29 DIAGNOSIS — E1122 Type 2 diabetes mellitus with diabetic chronic kidney disease: Secondary | ICD-10-CM

## 2014-06-29 DIAGNOSIS — Z79899 Other long term (current) drug therapy: Secondary | ICD-10-CM | POA: Insufficient documentation

## 2014-06-29 DIAGNOSIS — E1110 Type 2 diabetes mellitus with ketoacidosis without coma: Secondary | ICD-10-CM

## 2014-06-29 DIAGNOSIS — Z8679 Personal history of other diseases of the circulatory system: Secondary | ICD-10-CM

## 2014-06-29 DIAGNOSIS — Z87891 Personal history of nicotine dependence: Secondary | ICD-10-CM | POA: Insufficient documentation

## 2014-06-29 DIAGNOSIS — I671 Cerebral aneurysm, nonruptured: Secondary | ICD-10-CM

## 2014-06-29 DIAGNOSIS — I517 Cardiomegaly: Secondary | ICD-10-CM | POA: Diagnosis not present

## 2014-06-29 DIAGNOSIS — Z7982 Long term (current) use of aspirin: Secondary | ICD-10-CM | POA: Diagnosis not present

## 2014-06-29 DIAGNOSIS — I5023 Acute on chronic systolic (congestive) heart failure: Principal | ICD-10-CM | POA: Diagnosis present

## 2014-06-29 DIAGNOSIS — I509 Heart failure, unspecified: Secondary | ICD-10-CM

## 2014-06-29 DIAGNOSIS — Z794 Long term (current) use of insulin: Secondary | ICD-10-CM | POA: Diagnosis not present

## 2014-06-29 DIAGNOSIS — N183 Chronic kidney disease, stage 3 (moderate): Secondary | ICD-10-CM | POA: Insufficient documentation

## 2014-06-29 DIAGNOSIS — I6340 Cerebral infarction due to embolism of unspecified cerebral artery: Secondary | ICD-10-CM

## 2014-06-29 DIAGNOSIS — G4733 Obstructive sleep apnea (adult) (pediatric): Secondary | ICD-10-CM

## 2014-06-29 DIAGNOSIS — I129 Hypertensive chronic kidney disease with stage 1 through stage 4 chronic kidney disease, or unspecified chronic kidney disease: Secondary | ICD-10-CM | POA: Diagnosis not present

## 2014-06-29 DIAGNOSIS — E785 Hyperlipidemia, unspecified: Secondary | ICD-10-CM | POA: Diagnosis not present

## 2014-06-29 DIAGNOSIS — I5022 Chronic systolic (congestive) heart failure: Secondary | ICD-10-CM

## 2014-06-29 DIAGNOSIS — E1121 Type 2 diabetes mellitus with diabetic nephropathy: Secondary | ICD-10-CM | POA: Insufficient documentation

## 2014-06-29 DIAGNOSIS — Z8673 Personal history of transient ischemic attack (TIA), and cerebral infarction without residual deficits: Secondary | ICD-10-CM | POA: Diagnosis not present

## 2014-06-29 DIAGNOSIS — R29898 Other symptoms and signs involving the musculoskeletal system: Secondary | ICD-10-CM

## 2014-06-29 DIAGNOSIS — H53462 Homonymous bilateral field defects, left side: Secondary | ICD-10-CM

## 2014-06-29 DIAGNOSIS — R0600 Dyspnea, unspecified: Secondary | ICD-10-CM

## 2014-06-29 DIAGNOSIS — I493 Ventricular premature depolarization: Secondary | ICD-10-CM

## 2014-06-29 HISTORY — DX: Acute on chronic systolic (congestive) heart failure: I50.23

## 2014-06-29 LAB — BASIC METABOLIC PANEL
Anion gap: 11 (ref 5–15)
BUN: 22 mg/dL (ref 6–23)
CHLORIDE: 104 meq/L (ref 96–112)
CO2: 25 mEq/L (ref 19–32)
Calcium: 8.7 mg/dL (ref 8.4–10.5)
Creatinine, Ser: 1.49 mg/dL — ABNORMAL HIGH (ref 0.50–1.35)
GFR calc Af Amer: 54 mL/min — ABNORMAL LOW (ref >90)
GFR, EST NON AFRICAN AMERICAN: 46 mL/min — AB (ref >90)
Glucose, Bld: 149 mg/dL — ABNORMAL HIGH (ref 70–99)
POTASSIUM: 4.4 meq/L (ref 3.7–5.3)
SODIUM: 140 meq/L (ref 137–147)

## 2014-06-29 LAB — PRO B NATRIURETIC PEPTIDE: Pro B Natriuretic peptide (BNP): 2519 pg/mL — ABNORMAL HIGH (ref 0–125)

## 2014-06-29 LAB — GLUCOSE, CAPILLARY: Glucose-Capillary: 77 mg/dL (ref 70–99)

## 2014-06-29 LAB — CBC
HEMATOCRIT: 29.4 % — AB (ref 39.0–52.0)
Hemoglobin: 9.4 g/dL — ABNORMAL LOW (ref 13.0–17.0)
MCH: 24.1 pg — ABNORMAL LOW (ref 26.0–34.0)
MCHC: 32 g/dL (ref 30.0–36.0)
MCV: 75.4 fL — ABNORMAL LOW (ref 78.0–100.0)
Platelets: 164 10*3/uL (ref 150–400)
RBC: 3.9 MIL/uL — ABNORMAL LOW (ref 4.22–5.81)
RDW: 16.7 % — AB (ref 11.5–15.5)
WBC: 6.4 10*3/uL (ref 4.0–10.5)

## 2014-06-29 LAB — I-STAT TROPONIN, ED: TROPONIN I, POC: 0 ng/mL (ref 0.00–0.08)

## 2014-06-29 MED ORDER — GLIMEPIRIDE 1 MG PO TABS
1.0000 mg | ORAL_TABLET | Freq: Every day | ORAL | Status: DC
  Administered 2014-06-30: 1 mg via ORAL
  Filled 2014-06-29 (×2): qty 1

## 2014-06-29 MED ORDER — VITAMIN B-12 1000 MCG PO TABS
1000.0000 ug | ORAL_TABLET | Freq: Every day | ORAL | Status: DC
  Administered 2014-06-30: 1000 ug via ORAL
  Filled 2014-06-29: qty 1

## 2014-06-29 MED ORDER — ASPIRIN 325 MG PO TABS
325.0000 mg | ORAL_TABLET | Freq: Every day | ORAL | Status: DC
  Administered 2014-06-30: 325 mg via ORAL
  Filled 2014-06-29: qty 1

## 2014-06-29 MED ORDER — HYDRALAZINE HCL 10 MG PO TABS
10.0000 mg | ORAL_TABLET | Freq: Three times a day (TID) | ORAL | Status: DC
  Administered 2014-06-30 (×2): 10 mg via ORAL
  Filled 2014-06-29 (×4): qty 1

## 2014-06-29 MED ORDER — TAMSULOSIN HCL 0.4 MG PO CAPS
0.4000 mg | ORAL_CAPSULE | Freq: Two times a day (BID) | ORAL | Status: DC
  Administered 2014-06-30 (×2): 0.4 mg via ORAL
  Filled 2014-06-29 (×3): qty 1

## 2014-06-29 MED ORDER — ACETAMINOPHEN 325 MG PO TABS: 650.0000 mg | ORAL_TABLET | ORAL | Status: DC | PRN

## 2014-06-29 MED ORDER — CITALOPRAM HYDROBROMIDE 20 MG PO TABS
20.0000 mg | ORAL_TABLET | Freq: Every day | ORAL | Status: DC
  Administered 2014-06-30: 20 mg via ORAL
  Filled 2014-06-29: qty 1

## 2014-06-29 MED ORDER — FLUTICASONE PROPIONATE 50 MCG/ACT NA SUSP
2.0000 | Freq: Every day | NASAL | Status: DC
  Administered 2014-06-30: 2 via NASAL
  Filled 2014-06-29: qty 16

## 2014-06-29 MED ORDER — FLUTICASONE FUROATE 27.5 MCG/SPRAY NA SUSP: 2.0000 | Freq: Every day | NASAL | Status: DC

## 2014-06-29 MED ORDER — ATORVASTATIN CALCIUM 10 MG PO TABS
10.0000 mg | ORAL_TABLET | Freq: Every day | ORAL | Status: DC
  Administered 2014-06-30: 10 mg via ORAL
  Filled 2014-06-29 (×2): qty 1

## 2014-06-29 MED ORDER — FUROSEMIDE 10 MG/ML IJ SOLN
40.0000 mg | Freq: Once | INTRAMUSCULAR | Status: AC
  Administered 2014-06-29: 40 mg via INTRAVENOUS
  Filled 2014-06-29: qty 4

## 2014-06-29 MED ORDER — SODIUM CHLORIDE 0.9 % IJ SOLN: 3.0000 mL | INTRAMUSCULAR | Status: DC | PRN

## 2014-06-29 MED ORDER — METOPROLOL SUCCINATE ER 50 MG PO TB24
50.0000 mg | ORAL_TABLET | Freq: Two times a day (BID) | ORAL | Status: DC
  Administered 2014-06-30: 50 mg via ORAL
  Filled 2014-06-29 (×3): qty 1

## 2014-06-29 MED ORDER — INSULIN GLARGINE 100 UNIT/ML ~~LOC~~ SOLN
8.0000 [IU] | Freq: Every day | SUBCUTANEOUS | Status: DC
  Administered 2014-06-29 – 2014-06-30 (×2): 8 [IU] via SUBCUTANEOUS
  Filled 2014-06-29 (×3): qty 0.08

## 2014-06-29 MED ORDER — HEPARIN SODIUM (PORCINE) 5000 UNIT/ML IJ SOLN
5000.0000 [IU] | Freq: Three times a day (TID) | INTRAMUSCULAR | Status: DC
  Administered 2014-06-30: 5000 [IU] via SUBCUTANEOUS
  Filled 2014-06-29 (×4): qty 1

## 2014-06-29 MED ORDER — SODIUM CHLORIDE 0.9 % IJ SOLN
3.0000 mL | Freq: Two times a day (BID) | INTRAMUSCULAR | Status: DC
  Administered 2014-06-30 (×2): 3 mL via INTRAVENOUS

## 2014-06-29 MED ORDER — FUROSEMIDE 10 MG/ML IJ SOLN
40.0000 mg | Freq: Every day | INTRAMUSCULAR | Status: DC
  Administered 2014-06-30: 40 mg via INTRAVENOUS
  Filled 2014-06-29: qty 4

## 2014-06-29 MED ORDER — CLONAZEPAM 0.5 MG PO TABS: 0.5000 mg | ORAL_TABLET | Freq: Three times a day (TID) | ORAL | Status: DC | PRN

## 2014-06-29 MED ORDER — ONDANSETRON HCL 4 MG/2ML IJ SOLN: 4.0000 mg | Freq: Four times a day (QID) | INTRAMUSCULAR | Status: DC | PRN

## 2014-06-29 MED ORDER — INSULIN GLARGINE 100 UNIT/ML SOLOSTAR PEN: 8.0000 [IU] | PEN_INJECTOR | Freq: Every day | SUBCUTANEOUS | Status: DC

## 2014-06-29 MED ORDER — PANTOPRAZOLE SODIUM 40 MG PO TBEC
80.0000 mg | DELAYED_RELEASE_TABLET | Freq: Every day | ORAL | Status: DC
  Administered 2014-06-30: 80 mg via ORAL
  Filled 2014-06-29: qty 2

## 2014-06-29 MED ORDER — AMLODIPINE BESYLATE 5 MG PO TABS
5.0000 mg | ORAL_TABLET | Freq: Every day | ORAL | Status: DC
  Administered 2014-06-30: 5 mg via ORAL
  Filled 2014-06-29: qty 1

## 2014-06-29 MED ORDER — SODIUM CHLORIDE 0.9 % IV SOLN: 250.0000 mL | INTRAVENOUS | Status: DC | PRN

## 2014-06-29 MED ORDER — VITAMIN D (ERGOCALCIFEROL) 1.25 MG (50000 UNIT) PO CAPS: 50000.0000 [IU] | ORAL_CAPSULE | ORAL | Status: DC

## 2014-06-29 NOTE — ED Notes (Signed)
Pt. Reports increase in SOB, with associated chest pressure. States symptoms are worse when lying flat. Was recently in the hospital and taken off lasix due to dehydration and has not started again. Also reports swelling to ankles/feet.

## 2014-06-29 NOTE — ED Provider Notes (Signed)
Patient presented to the ER with shortness of breath and chest pressure. Symptoms have been progressively worsening over period of a month or more. Patient was recently hospitalized for dehydration and had Lasix stopped. He has been noticing increased swelling, we came and shortness of breath worse with lying flat or walking.  Face to face Exam: HEENT - PERRLA Lungs - CTAB Heart - RRR, S4 gallop, JVD present Abd - S/NT/ND Neuro - alert, oriented x3 Musculoskeletal - 2+ pitting edema bilaterally  Plan: Agent with 70 pound weight gain after stopping Lasix approximately 2 weeks ago. Workup today is consistent with congestive heart failure. Patient dyspneic with speaking full sentences. Oxygen saturation down to 91% with ambulation in the ER. He will require hospitalization for diuresis.   Orpah Greek, MD 06/29/14 2127

## 2014-06-29 NOTE — H&P (Signed)
[facility] History and Physical  Barry Haley [id number] DOB: Apr 14, 1946 DOA: 06/29/2014  Referring physician: EDP PCP: [name], MD   Chief Complaint: SOB   HPI: Barry Haley is a 68 y.o. male with h/o MV repair, TV repair, due to endocarditis in 5/14, development of CHF a year later with EF of 30-35% on TEE in August of this year.  He was taken off of his lasix during a hospital stay in August due to AKI on CKD (CKD Stage 2 prior baseline creatinine 1.2 with belief that his new creatinine of 1.5 was his new baseline at the time of discharge according to discharge summary 8/25).  Since being off of the lasix, he has gained ~ 20lbs of weight, and presents to the ED today with peripheral edema, SOB, orthopnea, etc.  Work up in the ED is c/w CHF.  His creatinine is 1.49 today (likely his new baseline for CKD).  Review of Systems: Systems reviewed.  As above, otherwise negative  Past Medical History  Diagnosis Date  . Hypertension   . Hyperlipidemia   . Diabetes   . Cerebral aneurysm, nonruptured   . Epistaxis   . Anxiety state, unspecified   . Unspecified cerebral artery occlusion with cerebral infarction   . Other shock without mention of trauma   . Personal history of other disorder of urinary system   . Personal history of other diseases of digestive system   . Hx of migraines   . Sleep apnea   . Meningitis 2010   Past Surgical History  Procedure Laterality Date  . Appendectomy      child  . 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 Mitral valve reconstruction with cardiopulmonary bypass  01/27/2013  . Tee without cardioversion N/A 04/22/2014    Procedure: TRANSESOPHAGEAL ECHOCARDIOGRAM (TEE);  Surgeon: Thayer Headings, MD;  Location: [place];  Service: Cardiovascular;  Laterality: N/A;  . Radiology with anesthesia N/A 04/23/2014    Procedure: RADIOLOGY WITH ANESTHESIA;  Surgeon: Medication Radiologist, MD;  Location: [facility];  Service:  Radiology;  Laterality: N/A;   Social History:  reports that he quit smoking about 5 years ago. His smoking use included Cigarettes. He has a 100 pack-year smoking history. He has never used smokeless tobacco. He reports that he drinks alcohol. He reports that he uses illicit drugs.  Allergies  Allergen Reactions  . Hctz [Hydrochlorothiazide] Other (See Comments)    Renal insufficiency  . Niaspan [Niacin Er] Itching  . Zolpidem Anxiety and Other (See Comments)    Bad dreams    Family History  Problem Relation Age of Onset  . Heart failure Mother   . Diabetes Mother   . Cervical cancer Mother   . Alzheimer's disease Father   . Diabetes Father   . Diabetes Brother      Prior to Admission medications   Medication Sig Start Date End Date Taking? Authorizing Provider  amLODipine (NORVASC) 10 MG tablet Take 5 mg by mouth daily. 04/27/14  Yes Allie Bossier, MD  aspirin 325 MG tablet Take 1 tablet (325 mg total) by mouth daily. 04/16/14  Yes Rosalin Hawking, MD  atorvastatin (LIPITOR) 10 MG tablet Take 10 mg by mouth at bedtime.   Yes Historical Provider, MD  citalopram (CELEXA) 20 MG tablet Take 20 mg by mouth daily. 04/27/14  Yes Allie Bossier, MD  clonazePAM (KLONOPIN) 0.5 MG tablet Take 0.5 mg by mouth every 8 (eight) hours as needed for anxiety. 03/24/14  Yes Philmore Pali, NP  ergocalciferol (VITAMIN D2) 50000 UNITS capsule Take 50,000 Units by mouth every 30 (thirty) days. No specific day   Yes Historical Provider, MD  esomeprazole (NEXIUM) 40 MG capsule Take 40 mg by mouth daily.    Yes Historical Provider, MD  fluticasone (VERAMYST) 27.5 MCG/SPRAY nasal spray Place 2 sprays into the nose daily.   Yes Historical Provider, MD  glimepiride (AMARYL) 2 MG tablet Take 1 mg by mouth daily with breakfast. 04/27/14  Yes Allie Bossier, MD  hydrALAZINE (APRESOLINE) 10 MG tablet Take 10 mg by mouth 3 (three) times daily.    Yes Historical Provider, MD  Insulin Glargine (LANTUS SOLOSTAR) 100 UNIT/ML  Solostar Pen Inject 8 Units into the skin daily at 10 pm. 04/27/14  Yes Allie Bossier, MD  metoprolol succinate (TOPROL-XL) 50 MG 24 hr tablet Take 50 mg by mouth 2 (two) times daily. Take with or immediately following a meal.   Yes Historical Provider, MD  Tamsulosin HCl (FLOMAX) 0.4 MG CAPS Take 0.4 mg by mouth 2 (two) times daily.    Yes Historical Provider, MD  vitamin B-12 (CYANOCOBALAMIN) 1000 MCG tablet Take 1,000 mcg by mouth daily.     Yes Historical Provider, MD  Insulin Pen Needle (AURORA PEN NEEDLES) 29G X 12MM MISC 8 Units by Does not apply route daily. 04/27/14   Allie Bossier, MD   Physical Exam: Filed Vitals:   06/29/14 2130  BP: 148/59  Pulse: 51  Temp:   Resp: 13    BP 148/59  Pulse 51  Temp(Src) 98.4 F (36.9 C)  Resp 13  Wt 108.863 kg (240 lb)  SpO2 97%  General Appearance:    Alert, oriented, no distress, appears stated age  Head:    Normocephalic, atraumatic  Eyes:    PERRL, EOMI, sclera non-icteric        Nose:   Nares without drainage or epistaxis. Mucosa, turbinates normal  Throat:   Moist mucous membranes. Oropharynx without erythema or exudate.  Neck:   Supple. No carotid bruits.  No thyromegaly.  No lymphadenopathy.   Back:     No CVA tenderness, no spinal tenderness  Lungs:     Rales in B lower lung fields  Chest wall:    No tenderness to palpitation  Heart:    Regular rate and rhythm without murmurs, gallops, rubs  Abdomen:     Soft, non-tender, distended but patient states this is chronic and baseline, normal bowel sounds, no organomegaly  Genitalia:    deferred  Rectal:    deferred  Extremities:   No clubbing, cyanosis, does have 2+ peripheral edema BLE.  Pulses:   2+ and symmetric all extremities  Skin:   Skin color, texture, turgor normal, no rashes or lesions  Lymph nodes:   Cervical, supraclavicular, and axillary nodes normal  Neurologic:   CNII-XII intact. Normal strength, sensation and reflexes      throughout    Labs on Admission:   Basic Metabolic Panel:  Recent Labs Lab 06/29/14 1539  NA 140  K 4.4  CL 104  CO2 25  GLUCOSE 149*  BUN 22  CREATININE 1.49*  CALCIUM 8.7   Liver Function Tests: No results found for this basename: AST, ALT, ALKPHOS, BILITOT, PROT, ALBUMIN,  in the last 168 hours No results found for this basename: LIPASE, AMYLASE,  in the last 168 hours No results found for this basename: AMMONIA,  in the last 168 hours CBC:  Recent  Labs Lab 06/29/14 1539  WBC 6.4  HGB 9.4*  HCT 29.4*  MCV 75.4*  PLT 164   Cardiac Enzymes: No results found for this basename: CKTOTAL, CKMB, CKMBINDEX, TROPONINI,  in the last 168 hours  BNP (last 3 results)  Recent Labs  06/29/14 1539  PROBNP 2519.0*   CBG: No results found for this basename: GLUCAP,  in the last 168 hours  Radiological Exams on Admission: Dg Chest 2 View  06/29/2014   CLINICAL DATA:  Left chest pain for several days, initial assessment. Intermittent dyspnea. Chest heaviness. Hypertension. Diabetes. Tricuspid and mitral valve repairs on 01/27/2013  EXAM: CHEST  2 VIEW  04/21/2014: 04/21/2014 FINDINGS:  Increased indistinctness of the pulmonary vasculature bilaterally with cephalization of blood flow indicating pulmonary venous hypertension. Faint Kerley B-lines. Small bilateral pleural effusions with associated passive atelectasis. Prosthetic tricuspid and mitral valves noted.  Mild cardiomegaly, cardiothoracic index 53%.  IMPRESSION: 1. Mild cardiomegaly with pulmonary venous hypertension and faint Kerley B-lines indicating early interstitial edema. Small bilateral pleural effusions. Appearance compatible with mild congestive heart failure.   Electronically Signed   By: Sherryl Barters M.D.   On: 06/29/2014 17:08    EKG: Independently reviewed.  Assessment/Plan Principal Problem:   Acute on chronic systolic CHF (congestive heart failure) Active Problems:   S/P mitral valve repair   S/P tricuspid valve repair   Chronic  systolic CHF (congestive heart failure)   CKD (chronic kidney disease) stage 2, GFR 60-89 ml/min   DM (diabetes mellitus), type 2 with renal complications   1. Acute on chronic systolic CHF - Unclear of the exact cause / etiology of his development of CHF over these past few months but suspect that the reduced EF is a sequela of his MV and TV repairs done in May 2014 for endocarditis.  Echos done in July and Aug of this year demonstrate no significant residual valvular dysfunction at this point. 1. EF 30-35% by TEE on 04/22/14 2. Lasix 40mg  IV in ED and 40mg  IV daily for now, suspect he will be discharged on 20mg  PO daily when he is finally discharged 3. Daily BMPs to monitor renal function change due to lasix 4. Strict intake and output 2. DM2 - continue home meds, CBG checks AC/HS 3. CKD stage 3 - baseline creatinine 1.5, at baseline today, daily BMPs as noted above.    Code Status: Full Code  Family Communication: Family at bedside Disposition Plan: Admit to obs   Time spent: 70 min  GARDNER, JARED M. [facility] Pager [phone]  If 7AM-7PM, please contact the day team taking care of the patient [email or web address] Password [facility] 06/29/2014, 10:36 PM

## 2014-06-29 NOTE — ED Provider Notes (Signed)
CSN: [id number]     Arrival date & time 06/29/14  1518 History   First MD Initiated Contact with Patient 06/29/14 1933     Chief Complaint  Patient presents with  . Chest Pain  . Shortness of Breath     (Consider location/radiation/quality/duration/timing/severity/associated sxs/prior Treatment) HPI Comments: Barry Haley is a 68 y.o. male with a PMHx of HTN, HLD, DM2, CVA, CKD, remote hx of meningitis and endocarditis, tricuspid and mitral valve repair, and chronic diastolic dysfunction (EF in 04/2014 30-35%), who presents to the ED with complaints of worsening SOB and DOE with associated chest tightness x1 day. Pt reports that he was taken off lasix 2 months ago due to concerns of kidney damage, and that since then he has developed worsening LE swelling and DOE as well as worsening orthopnea. Yesterday around mid-day he became very dyspneic with simple tasks such as tying his shoes and walking short distances. His chest tightness is in the L anterior chest wall, 5/10, tight/pressure-like, constant, nonradiating, with no known aggravating factors, and relieved slightly with menthol cough drops and ice cold water. He states this helps him feel like he catches his breath. Last night around midnight he was awakened gasping for breath which concerned him, and had not been present before. He's had a mild dry cough for several weeks, denies sputum or hemoptysis, but reports some intermittent wheezing. Denies fevers, chills, URI symptoms, HA, vision changes, diaphoresis, jaw/neck/back/arm pain, syncope, lightheadedness, abd pain, n/v/d/c, urinary or stool changes, melena, hematochezia, paresthesias, weakness, numbness, or neuro deficits. PCP is Dr. Lin Landsman at [facility], cardiologist is Dr. Maylon Peppers at [facility], and nephrologist is Dr. Justin Mend.    Patient is a 68 y.o. male presenting with shortness of breath. The history is provided by the patient. No language interpreter was used.  Shortness of Breath Severity:   Moderate Onset quality:  Gradual Duration:  1 day (worse in last 1 day, but ongoing for 1-2 months) Timing:  Constant Progression:  Worsening Chronicity:  Recurrent Context: activity   Relieved by:  Rest (menthol drops and ice cold water) Worsened by:  Exertion Ineffective treatments:  None tried Associated symptoms: chest pain, cough, PND and wheezing   Associated symptoms: no abdominal pain, no claudication, no diaphoresis, no ear pain, no fever, no headaches, no hemoptysis, no neck pain, no rash, no sore throat, no sputum production, no syncope and no vomiting   Risk factors: obesity     Past Medical History  Diagnosis Date  . Hypertension   . Hyperlipidemia   . Diabetes   . Cerebral aneurysm, nonruptured   . Epistaxis   . Anxiety state, unspecified   . Unspecified cerebral artery occlusion with cerebral infarction   . Other shock without mention of trauma   . Personal history of other disorder of urinary system   . Personal history of other diseases of digestive system   . Hx of migraines   . Sleep apnea   . Meningitis 2010   Past Surgical History  Procedure Laterality Date  . Appendectomy      child  . 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 Mitral valve reconstruction with cardiopulmonary bypass  01/27/2013  . Tee without cardioversion N/A 04/22/2014    Procedure: TRANSESOPHAGEAL ECHOCARDIOGRAM (TEE);  Surgeon: Thayer Headings, MD;  Location: [place];  Service: Cardiovascular;  Laterality: N/A;  . Radiology with anesthesia N/A 04/23/2014    Procedure: RADIOLOGY WITH ANESTHESIA;  Surgeon: Medication Radiologist, MD;  Location: [place];  Service: Radiology;  Laterality: N/A;   Family History  Problem Relation Age of Onset  . Heart failure Mother   . Diabetes Mother   . Cervical cancer Mother   . Alzheimer's disease Father   . Diabetes Father   . Diabetes Brother    History  Substance Use Topics  . Smoking status: Former Smoker -- 2.50 packs/day  for 40 years    Types: Cigarettes    Quit date: 10/04/2008  . Smokeless tobacco: Never Used  . Alcohol Use: Yes     Comment: quit: 10/04/2008- rarely now    Review of Systems  Constitutional: Positive for unexpected weight change (since august). Negative for fever, chills and diaphoresis.  HENT: Negative for congestion, ear pain, sinus pressure and sore throat.   Respiratory: Positive for cough, chest tightness, shortness of breath and wheezing. Negative for hemoptysis and sputum production.   Cardiovascular: Positive for chest pain, leg swelling and PND. Negative for palpitations, claudication and syncope.  Gastrointestinal: Negative for nausea, vomiting, abdominal pain, diarrhea, constipation, blood in stool and abdominal distention.  Genitourinary: Negative for dysuria, hematuria and flank pain.  Musculoskeletal: Negative for arthralgias, back pain, myalgias and neck pain.  Skin: Negative for rash.  Neurological: Negative for dizziness, syncope, weakness, light-headedness, numbness and headaches.  Psychiatric/Behavioral: Negative for confusion.   10 Systems reviewed and are negative for acute change except as noted in the HPI.    Allergies  Hctz; Niaspan; and Zolpidem  Home Medications   Prior to Admission medications   Medication Sig Start Date End Date Taking? Authorizing Provider  amLODipine (NORVASC) 10 MG tablet Take 1 tablet (10 mg total) by mouth daily. 04/27/14   Allie Bossier, MD  aspirin 325 MG tablet Take 1 tablet (325 mg total) by mouth daily. 04/16/14   Rosalin Hawking, MD  atorvastatin (LIPITOR) 10 MG tablet Take 10 mg by mouth at bedtime.    Historical Provider, MD  citalopram (CELEXA) 20 MG tablet Take 1 tablet (20 mg total) by mouth daily. 04/27/14   Allie Bossier, MD  clonazePAM (KLONOPIN) 0.5 MG tablet Take 1 tablet (0.5 mg total) by mouth every 8 (eight) hours as needed. 03/24/14   Philmore Pali, NP  esomeprazole (NEXIUM) 40 MG capsule Take 40 mg by mouth daily.      Historical Provider, MD  fluticasone (VERAMYST) 27.5 MCG/SPRAY nasal spray Place 2 sprays into the nose daily.    Historical Provider, MD  glimepiride (AMARYL) 2 MG tablet Take 1 tablet (2 mg total) by mouth daily with breakfast. 04/27/14   Allie Bossier, MD  hydrALAZINE (APRESOLINE) 10 MG tablet Take 5 mg by mouth 3 (three) times daily.     Historical Provider, MD  Insulin Glargine (LANTUS SOLOSTAR) 100 UNIT/ML Solostar Pen Inject 8 Units into the skin daily at 10 pm. 04/27/14   Allie Bossier, MD  Insulin Pen Needle (AURORA PEN NEEDLES) 29G X 12MM MISC 8 Units by Does not apply route daily. 04/27/14   Allie Bossier, MD  linagliptin (TRADJENTA) 5 MG TABS tablet Take 1 tablet (5 mg total) by mouth daily. 04/27/14   Allie Bossier, MD  metoprolol succinate (TOPROL-XL) 50 MG 24 hr tablet Take 50 mg by mouth 2 (two) times daily. Take with or immediately following a meal.    Historical Provider, MD  Tamsulosin HCl (FLOMAX) 0.4 MG CAPS Take 0.4 mg by mouth 2 (two) times daily.     Historical Provider, MD  vitamin B-12 (CYANOCOBALAMIN)  1000 MCG tablet Take 1,000 mcg by mouth daily.      Historical Provider, MD   BP 163/57  Pulse 56  Temp(Src) 98.4 F (36.9 C)  Resp 18  SpO2 96% Physical Exam  Nursing note and vitals reviewed. Constitutional: He is oriented to person, place, and time. He appears well-developed and well-nourished. No distress.  Afebrile, nontoxic. Bradycardia noted, pleasant obese male who becomes mildly dyspneic during conversation but able to speak in full sentences, oxygen >97% on RA  HENT:  Head: Normocephalic and atraumatic.  Mouth/Throat: Oropharynx is clear and moist and mucous membranes are normal.  Eyes: Conjunctivae and EOM are normal. Right eye exhibits no discharge. Left eye exhibits no discharge.  Neck: Normal range of motion. Neck supple. JVD present.  Mild JVD  Cardiovascular: Regular rhythm, normal heart sounds and intact distal pulses.  Bradycardia present.  Exam  reveals no gallop and no friction rub.   No murmur heard. Baseline bradycardia noted, occasional PVCs  Pulmonary/Chest: Effort normal. No accessory muscle usage. No respiratory distress. He has decreased breath sounds. He has no wheezes. He has no rhonchi. He has rales.  Slightly diminished breath sounds in b/l lower lung fields with middle and lower lung field rales noted. Oxygen saturation >97% on RA but pt becomes dyspneic when speaking, able to speak in full sentences but has to stop intermittently to catch his breath  Abdominal: Soft. Normal appearance and bowel sounds are normal. He exhibits no distension. There is no tenderness. There is no rigidity, no rebound and no guarding.  Obese, soft, NTND, +BS throughout, no r/g/r  Musculoskeletal: Normal range of motion. He exhibits edema.  1-2+ bilateral pre-tibial edema, no sacral edema.   Neurological: He is alert and oriented to person, place, and time. He has normal strength. No sensory deficit.  Skin: Skin is warm, dry and intact. No rash noted.  Psychiatric: He has a normal mood and affect.   Wt Readings from Last 3 Encounters:  06/29/14 240 lb (108.863 kg)  05/27/14 233 lb 12.8 oz (106.051 kg)  04/21/14 215 lb 6.2 oz (97.7 kg)   ED Course  Procedures (including critical care time) Labs Review Labs Reviewed  CBC - Abnormal; Notable for the following:    RBC 3.90 (*)    Hemoglobin 9.4 (*)    HCT 29.4 (*)    MCV 75.4 (*)    MCH 24.1 (*)    RDW 16.7 (*)    All other components within normal limits  BASIC METABOLIC PANEL - Abnormal; Notable for the following:    Glucose, Bld 149 (*)    Creatinine, Ser 1.49 (*)    GFR calc non Af Amer 46 (*)    GFR calc Af Amer 54 (*)    All other components within normal limits  PRO B NATRIURETIC PEPTIDE - Abnormal; Notable for the following:    Pro B Natriuretic peptide (BNP) 2519.0 (*)    All other components within normal limits  I-STAT TROPOININ, ED    Imaging Review Dg Chest 2  View  06/29/2014   CLINICAL DATA:  Left chest pain for several days, initial assessment. Intermittent dyspnea. Chest heaviness. Hypertension. Diabetes. Tricuspid and mitral valve repairs on 01/27/2013  EXAM: CHEST  2 VIEW  04/21/2014: 04/21/2014 FINDINGS:  Increased indistinctness of the pulmonary vasculature bilaterally with cephalization of blood flow indicating pulmonary venous hypertension. Faint Kerley B-lines. Small bilateral pleural effusions with associated passive atelectasis. Prosthetic tricuspid and mitral valves noted.  Mild cardiomegaly, cardiothoracic  index 53%.  IMPRESSION: 1. Mild cardiomegaly with pulmonary venous hypertension and faint Kerley B-lines indicating early interstitial edema. Small bilateral pleural effusions. Appearance compatible with mild congestive heart failure.   Electronically Signed   By: Sherryl Barters M.D.   On: 06/29/2014 17:08     EKG Interpretation None      MDM   Final diagnoses:  Acute exacerbation of CHF (congestive heart failure)  CKD (chronic kidney disease), unspecified stage    68y/o male with CHF exacerbation, gradually worsening after coming off his lasix 2 months ago but acutely worse since last night. EKG relatively unchanged from prior tracings. CXR consistent with CHF exacerbation. CBC showing lower H/H than prior values, Hgb now 9.4, appears to be microcytic hypochromic likely iron deficiency or anemia of chronic disease. BMP showing Cr 1.49 which is near baseline, GFR 46 near baseline. BNP 2519. Will weight pt, as it appears his weight is up 17# from 04/2014 to 05/2014. Will give 40mg  lasix, and ambulate pt to see how his HR and saturations perform, but pt likely admission since his kidney disease limits amount of lasix he can get and he becomes dyspneic during conversation. Will reassess shortly.  9:19 PM During ambulation pt's saturations drop to 91%, no tachycardia during ambulation. Weight today is 240#, therefore total weight gain  since 04/2014 is 25#. Will admit to hospitalist for gradual diuresis.   9:43 PM Dr. Jennette Kettle returning page, will admit for diuresis. Admission orders placed for tele obs.   Patty Sermons [place], [place] 06/29/14 2144

## 2014-06-29 NOTE — ED Provider Notes (Signed)
Medical screening examination/treatment/procedure(s) were conducted as a shared visit with non-physician practitioner(s) and myself.  I personally evaluated the patient during the encounter.  Please see separate associated note for evaluation and plan.    EKG Interpretation   Date/Time:  Tuesday June 29 2014 15:27:34 EDT Ventricular Rate:  59 PR Interval:    QRS Duration: 88 QT Interval:  480 QTC Calculation: 475 R Axis:   67 Text Interpretation:  Junctional rhythm with occasional Premature  ventricular complexes Nonspecific ST and T wave abnormality Prolonged QT  Abnormal ECG Confirmed by Betsey Holiday  MD, Maryam Feely [phone]) on 06/29/2014  8:34:05 PM       Orpah Greek, MD 06/29/14 2146

## 2014-06-29 NOTE — ED Notes (Signed)
Pt ambulated to scale and back.  Pt's O2 sats between 91-95% on RA.  Expiratory wheezing noted.  Heart rate maintained in the 80's.

## 2014-06-30 DIAGNOSIS — N183 Chronic kidney disease, stage 3 (moderate): Secondary | ICD-10-CM | POA: Diagnosis not present

## 2014-06-30 DIAGNOSIS — E1122 Type 2 diabetes mellitus with diabetic chronic kidney disease: Secondary | ICD-10-CM | POA: Diagnosis not present

## 2014-06-30 DIAGNOSIS — I5023 Acute on chronic systolic (congestive) heart failure: Secondary | ICD-10-CM | POA: Diagnosis not present

## 2014-06-30 LAB — BASIC METABOLIC PANEL
ANION GAP: 12 (ref 5–15)
BUN: 23 mg/dL (ref 6–23)
CHLORIDE: 104 meq/L (ref 96–112)
CO2: 26 mEq/L (ref 19–32)
Calcium: 8.7 mg/dL (ref 8.4–10.5)
Creatinine, Ser: 1.57 mg/dL — ABNORMAL HIGH (ref 0.50–1.35)
GFR calc non Af Amer: 44 mL/min — ABNORMAL LOW (ref >90)
GFR, EST AFRICAN AMERICAN: 51 mL/min — AB (ref >90)
Glucose, Bld: 114 mg/dL — ABNORMAL HIGH (ref 70–99)
Potassium: 4.1 mEq/L (ref 3.7–5.3)
SODIUM: 142 meq/L (ref 137–147)

## 2014-06-30 LAB — GLUCOSE, CAPILLARY
Glucose-Capillary: 116 mg/dL — ABNORMAL HIGH (ref 70–99)
Glucose-Capillary: 133 mg/dL — ABNORMAL HIGH (ref 70–99)

## 2014-06-30 MED ORDER — AMLODIPINE BESYLATE 10 MG PO TABS: 10.0000 mg | ORAL_TABLET | Freq: Every day | ORAL | Status: AC

## 2014-06-30 MED ORDER — FUROSEMIDE 20 MG PO TABS: 20.0000 mg | ORAL_TABLET | Freq: Every day | ORAL | Status: DC

## 2014-06-30 MED ORDER — METOPROLOL SUCCINATE ER 25 MG PO TB24: 25.0000 mg | ORAL_TABLET | Freq: Two times a day (BID) | ORAL | Status: DC

## 2014-06-30 NOTE — Progress Notes (Signed)
UR completed 

## 2014-06-30 NOTE — Progress Notes (Signed)
Patient arrived from ED via stretcher. Alert and oriented x 4. No complaints of pain. Junctional rhythm on telemetry with rate in 50's. Spouse with patient. CBG 77. CHF plan of care initiated.

## 2014-06-30 NOTE — Plan of Care (Signed)
Problem: Phase I Progression Outcomes Goal: EF % per last Echo/documented,Core Reminder form on chart Outcome: Completed/Met Date Met:  06/30/14 EF 30-35% as of 04/2014

## 2014-06-30 NOTE — Discharge Summary (Signed)
Physician Discharge Summary  Barry Haley [id number] DOB: [birth year] DOA: 06/29/2014  PCP: [name], MD  Admit date: 06/29/2014 Discharge date: 06/30/2014  Time spent: > 35  minutes  Recommendations for Outpatient Follow-up:  -Monitor BP's as patient's blood pressure medication was adjusted while in house. I have decreased the dose of his B blocker due to recorded HR of 33 while in house. - restart patient on lasix 20 mg po daily - Reassess K levels - Monitor Serum Creatinine levels  Discharge Diagnoses:  Principal Problem:   Acute on chronic systolic CHF (congestive heart failure) Active Problems:   S/P mitral valve repair   S/P tricuspid valve repair   Chronic systolic CHF (congestive heart failure)   CKD stage 3 due to type 2 diabetes mellitus   DM (diabetes mellitus), type 2 with renal complications   Discharge Condition: stable  Diet recommendation: heart healthy  Filed Weights   06/29/14 2117 06/29/14 2244 06/30/14 0514  Weight: 108.863 kg (240 lb) 107.729 kg (237 lb 8 oz) 108.274 kg (238 lb 11.2 oz)    History of present illness:  From original HPI: 68 y.o. male with h/o MV repair, TV repair, due to endocarditis in 5/14, development of CHF a year later with EF of 30-35% on TEE in August of this year. He was taken off of his lasix during a hospital stay in August due to AKI on CKD (CKD Stage 2 prior baseline creatinine 1.2 with belief that his new creatinine of 1.5 was his new baseline at the time of discharge according to discharge summary 8/25). Since being off of the lasix, he has gained ~ 20lbs of weight, and presents to the ED today with peripheral edema, SOB, orthopnea.   Hospital Course:  Acute systolic CHF exacerbation - Improved after IV lasix administration - decrease B blocker due to decreased HR - Recommended patient f/u with cardiologist in town within the next 1 week - Heart healthy diet - D/C on lasix  HTN - decreased B blocker dose  due to bradycardia - increased amlodipine dose on d/c  CKD - Reportedly baseline at 1.5. - at baseline on d/c - will need continued monitoring on d/c  Procedures:  None  Consultations:  none  Discharge Exam: Filed Vitals:   06/30/14 0928  BP: 159/67  Pulse: 60  Temp: 98.1 F (36.7 C)  Resp: 20    General: Pt in nad, alert and awake Cardiovascular: S1 and S2 present, no rubs Respiratory: CTA BL, no wheezes, no increased wob on room air, speaking in full sentences  Discharge Instructions You were cared for by a hospitalist during your hospital stay. If you have any questions about your discharge medications or the care you received while you were in the hospital after you are discharged, you can call the unit and asked to speak with the hospitalist on call if the hospitalist that took care of you is not available. Once you are discharged, your primary care physician will handle any further medical issues. Please note that NO REFILLS for any discharge medications will be authorized once you are discharged, as it is imperative that you return to your primary care physician (or establish a relationship with a primary care physician if you do not have one) for your aftercare needs so that they can reassess your need for medications and monitor your lab values.  Discharge Instructions   Call MD for:  difficulty breathing, headache or visual disturbances    Complete by:  As  directed      Call MD for:  extreme fatigue    Complete by:  As directed      Call MD for:  temperature >100.4    Complete by:  As directed      Diet - low sodium heart healthy    Complete by:  As directed      Discharge instructions    Complete by:  As directed   Please be sure to follow up with your cardiologist within the next 1 week to reassess your blood pressure, potassium level, and your creatinine.     Increase activity slowly    Complete by:  As directed           Current Discharge Medication  List    START taking these medications   Details  furosemide (LASIX) 20 MG tablet Take 1 tablet (20 mg total) by mouth daily. Qty: 30 tablet, Refills: 0      CONTINUE these medications which have CHANGED   Details  amLODipine (NORVASC) 10 MG tablet Take 1 tablet (10 mg total) by mouth daily. Qty: 30 tablet, Refills: 0    metoprolol succinate (TOPROL-XL) 25 MG 24 hr tablet Take 1 tablet (25 mg total) by mouth 2 (two) times daily. Take with or immediately following a meal.      CONTINUE these medications which have NOT CHANGED   Details  aspirin 325 MG tablet Take 1 tablet (325 mg total) by mouth daily. Qty: 30 tablet, Refills: 2   Associated Diagnoses: Cerebral embolism with cerebral infarction    atorvastatin (LIPITOR) 10 MG tablet Take 10 mg by mouth at bedtime.    citalopram (CELEXA) 20 MG tablet Take 20 mg by mouth daily.    clonazePAM (KLONOPIN) 0.5 MG tablet Take 0.5 mg by mouth every 8 (eight) hours as needed for anxiety.    ergocalciferol (VITAMIN D2) 50000 UNITS capsule Take 50,000 Units by mouth every 30 (thirty) days. No specific day    esomeprazole (NEXIUM) 40 MG capsule Take 40 mg by mouth daily.     fluticasone (VERAMYST) 27.5 MCG/SPRAY nasal spray Place 2 sprays into the nose daily.    glimepiride (AMARYL) 2 MG tablet Take 1 mg by mouth daily with breakfast.    hydrALAZINE (APRESOLINE) 10 MG tablet Take 10 mg by mouth 3 (three) times daily.    Associated Diagnoses: Aneurysm, cerebral, nonruptured    Insulin Glargine (LANTUS SOLOSTAR) 100 UNIT/ML Solostar Pen Inject 8 Units into the skin daily at 10 pm. Qty: 15 mL, Refills: 11    Tamsulosin HCl (FLOMAX) 0.4 MG CAPS Take 0.4 mg by mouth 2 (two) times daily.     vitamin B-12 (CYANOCOBALAMIN) 1000 MCG tablet Take 1,000 mcg by mouth daily.      Insulin Pen Needle (AURORA PEN NEEDLES) 29G X 12MM MISC 8 Units by Does not apply route daily. Qty: 100 each, Refills: 0       Allergies  Allergen Reactions  .  Hctz [Hydrochlorothiazide] Other (See Comments)    Renal insufficiency  . Niaspan [Niacin Er] Itching  . Zolpidem Anxiety and Other (See Comments)    Bad dreams      The results of significant diagnostics from this hospitalization (including imaging, microbiology, ancillary and laboratory) are listed below for reference.    Significant Diagnostic Studies: Dg Chest 2 View  06/29/2014   CLINICAL DATA:  Left chest pain for several days, initial assessment. Intermittent dyspnea. Chest heaviness. Hypertension. Diabetes. Tricuspid and mitral valve repairs on  01/27/2013  EXAM: CHEST  2 VIEW  04/21/2014: 04/21/2014 FINDINGS:  Increased indistinctness of the pulmonary vasculature bilaterally with cephalization of blood flow indicating pulmonary venous hypertension. Faint Kerley B-lines. Small bilateral pleural effusions with associated passive atelectasis. Prosthetic tricuspid and mitral valves noted.  Mild cardiomegaly, cardiothoracic index 53%.  IMPRESSION: 1. Mild cardiomegaly with pulmonary venous hypertension and faint Kerley B-lines indicating early interstitial edema. Small bilateral pleural effusions. Appearance compatible with mild congestive heart failure.   Electronically Signed   By: Sherryl Barters M.D.   On: 06/29/2014 17:08    Microbiology: No results found for this or any previous visit (from the past 240 hour(s)).   Labs: Basic Metabolic Panel:  Recent Labs Lab 06/29/14 1539 06/30/14 0500  NA 140 142  K 4.4 4.1  CL 104 104  CO2 25 26  GLUCOSE 149* 114*  BUN 22 23  CREATININE 1.49* 1.57*  CALCIUM 8.7 8.7   Liver Function Tests: No results found for this basename: AST, ALT, ALKPHOS, BILITOT, PROT, ALBUMIN,  in the last 168 hours No results found for this basename: LIPASE, AMYLASE,  in the last 168 hours No results found for this basename: AMMONIA,  in the last 168 hours CBC:  Recent Labs Lab 06/29/14 1539  WBC 6.4  HGB 9.4*  HCT 29.4*  MCV 75.4*  PLT 164    Cardiac Enzymes: No results found for this basename: CKTOTAL, CKMB, CKMBINDEX, TROPONINI,  in the last 168 hours BNP: BNP (last 3 results)  Recent Labs  06/29/14 1539  PROBNP 2519.0*   CBG:  Recent Labs Lab 06/29/14 2249 06/30/14 0720  GLUCAP 77 116*       Signed:  Velvet Bathe  [facility] 06/30/2014, 10:07 AM

## 2014-06-30 NOTE — Progress Notes (Signed)
Patient discharged to home, transported by daughter.  IV removed prior to discharge; IV site clean, dry, and intact.  Discharge instructions, education, and medications discussed with patient and daughter prior to discharge; both voice understanding of information.

## 2014-06-30 NOTE — Care Management Note (Signed)
    Page 1 of 1   06/30/2014     11:10:03 AM CARE MANAGEMENT NOTE 06/30/2014  Patient:  Barry Haley, Barry Haley   Account Number:  [id number]  Date Initiated:  06/30/2014  Documentation initiated by:  Fuller Mandril  Subjective/Objective Assessment:   68 y.o. male was taken off of his lasix during a hospital stay in August due to AKI on CKD.  Work up in the ED is c/w CHF.//Home with spouse.     Action/Plan:   IV lasix.//Access for disposition needs.   Anticipated DC Date:  06/30/2014   Anticipated DC Plan:  [place]  CM consult      Choice offered to / List presented to:             Status of service:  Completed, signed off Medicare Important Message given?   (If response is "NO", the following Medicare IM given date fields will be blank) Date Medicare IM given:   Medicare IM given by:   Date Additional Medicare IM given:   Additional Medicare IM given by:    Discharge Disposition:    Per UR Regulation:  Reviewed for med. necessity/level of care/duration of stay  If discussed at [place] of Stay Meetings, dates discussed:    Comments:

## 2014-07-05 DIAGNOSIS — R0602 Shortness of breath: Secondary | ICD-10-CM | POA: Diagnosis not present

## 2014-07-05 DIAGNOSIS — I509 Heart failure, unspecified: Secondary | ICD-10-CM | POA: Diagnosis not present

## 2014-07-12 DIAGNOSIS — Z9889 Other specified postprocedural states: Secondary | ICD-10-CM | POA: Diagnosis not present

## 2014-07-12 DIAGNOSIS — G4730 Sleep apnea, unspecified: Secondary | ICD-10-CM | POA: Diagnosis not present

## 2014-07-12 DIAGNOSIS — N183 Chronic kidney disease, stage 3 (moderate): Secondary | ICD-10-CM | POA: Diagnosis not present

## 2014-07-12 DIAGNOSIS — E119 Type 2 diabetes mellitus without complications: Secondary | ICD-10-CM | POA: Diagnosis not present

## 2014-07-28 DIAGNOSIS — Z9889 Other specified postprocedural states: Secondary | ICD-10-CM | POA: Diagnosis not present

## 2014-07-28 DIAGNOSIS — E119 Type 2 diabetes mellitus without complications: Secondary | ICD-10-CM | POA: Diagnosis not present

## 2014-07-28 DIAGNOSIS — I509 Heart failure, unspecified: Secondary | ICD-10-CM | POA: Diagnosis not present

## 2014-07-28 DIAGNOSIS — N183 Chronic kidney disease, stage 3 (moderate): Secondary | ICD-10-CM | POA: Diagnosis not present

## 2014-08-06 DIAGNOSIS — R351 Nocturia: Secondary | ICD-10-CM | POA: Diagnosis not present

## 2014-08-06 DIAGNOSIS — N401 Enlarged prostate with lower urinary tract symptoms: Secondary | ICD-10-CM | POA: Diagnosis not present

## 2014-08-09 DIAGNOSIS — I509 Heart failure, unspecified: Secondary | ICD-10-CM | POA: Diagnosis not present

## 2014-08-09 DIAGNOSIS — E1165 Type 2 diabetes mellitus with hyperglycemia: Secondary | ICD-10-CM | POA: Diagnosis not present

## 2014-08-16 DIAGNOSIS — I509 Heart failure, unspecified: Secondary | ICD-10-CM | POA: Diagnosis not present

## 2014-08-16 DIAGNOSIS — E1165 Type 2 diabetes mellitus with hyperglycemia: Secondary | ICD-10-CM | POA: Diagnosis not present

## 2014-08-24 DIAGNOSIS — E1165 Type 2 diabetes mellitus with hyperglycemia: Secondary | ICD-10-CM | POA: Diagnosis not present

## 2014-08-24 DIAGNOSIS — I509 Heart failure, unspecified: Secondary | ICD-10-CM | POA: Diagnosis not present

## 2014-09-03 DIAGNOSIS — I671 Cerebral aneurysm, nonruptured: Secondary | ICD-10-CM

## 2014-09-03 DIAGNOSIS — D649 Anemia, unspecified: Secondary | ICD-10-CM

## 2014-09-03 HISTORY — DX: Anemia, unspecified: D64.9

## 2014-09-03 HISTORY — DX: Cerebral aneurysm, nonruptured: I67.1

## 2014-09-27 ENCOUNTER — Ambulatory Visit (INDEPENDENT_AMBULATORY_CARE_PROVIDER_SITE_OTHER): Payer: Medicare Other | Admitting: Neurology

## 2014-09-27 ENCOUNTER — Encounter: Payer: Self-pay | Admitting: Neurology

## 2014-09-27 VITALS — BP 140/49 | HR 45 | Ht 70.5 in | Wt 238.6 lb

## 2014-09-27 DIAGNOSIS — N189 Chronic kidney disease, unspecified: Secondary | ICD-10-CM

## 2014-09-27 DIAGNOSIS — E1122 Type 2 diabetes mellitus with diabetic chronic kidney disease: Secondary | ICD-10-CM | POA: Diagnosis not present

## 2014-09-27 DIAGNOSIS — I10 Essential (primary) hypertension: Secondary | ICD-10-CM

## 2014-09-27 DIAGNOSIS — E785 Hyperlipidemia, unspecified: Secondary | ICD-10-CM

## 2014-09-27 DIAGNOSIS — I6340 Cerebral infarction due to embolism of unspecified cerebral artery: Secondary | ICD-10-CM | POA: Diagnosis not present

## 2014-09-27 DIAGNOSIS — I671 Cerebral aneurysm, nonruptured: Secondary | ICD-10-CM

## 2014-09-27 DIAGNOSIS — E1165 Type 2 diabetes mellitus with hyperglycemia: Secondary | ICD-10-CM | POA: Insufficient documentation

## 2014-09-27 HISTORY — DX: Type 2 diabetes mellitus with hyperglycemia: E11.65

## 2014-09-27 HISTORY — DX: Essential (primary) hypertension: I10

## 2014-09-27 MED ORDER — ASPIRIN EC 325 MG PO TBEC: 325.0000 mg | DELAYED_RELEASE_TABLET | Freq: Every day | ORAL | Status: DC

## 2014-09-27 NOTE — Progress Notes (Signed)
STROKE NEUROLOGY FOLLOW UP NOTE  NAME: Barry Haley DOB: 23-Jan-1946  REASON FOR VISIT: stroke follow up HISTORY FROM: chart  Today we had the pleasure of seeing Kamerin Manwiller in follow-up at our Neurology Clinic. Pt was accompanied by wife.   History Summary For detailed info please refer to my note on 04/16/14.  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 In 2014, he again developed sepsis with bacteremia and TEE showed endocarditis. He had MV and TV repair in [facility]. Recently developed CHF and followed up with Dr. Bridgett Larsson in [facility] cardiology. He started to have left hemianopia and left visual disturbance mid August and concerns for a new stroke. During last clinic visit, I ordered MRI and TEE and EEG. However, before these being done, he was admitted to [facility] due to worsening generalized weakness and confusion. Found to have DM and DKA and A1C 13.7 as well as AKI. He was started on insulin drip and then subq. His MRI done should no acute stroke and TEE showed PFO and EF 35-40% without vegetations, EEG negative for seizure. His visual disturbance was considered as recrudescence of previous right occipital hemorrhagic infarct in 2011. He was continued on ASA and sent to CIR.   05/27/14 follow up - the patient has been doing well. He has OT to follow twice a week. His visual disturbance is gone. Blood sugar at home 110-120s. BP stable today 125/54. He is on insulin subq for DM control. He has PCP to see on 10/7 and cardiologist to f/u on 06/10/14. 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   Interval History During the interval time, pt was doing OK with neurology symptoms. However, he was admitted in 06/2014 for CHF exacerbation. He was resumed on lasix and much improved. He currently following up with his cardiology in [place]. His ASA  was increased to 325mg . His BP today in clinic was 140/49. Otherwise, he does not have neuro complains. Dr. Cleotilde Neer office has contacted him for appointment but he hold it off due to CHF exacerbation at that time.   REVIEW OF SYSTEMS: Full 14 system review of systems performed and notable only for those listed below and in HPI above, all others are negative:  Constitutional: N/A  Cardiovascular: murmur  Ear/Nose/Throat: N/A  Skin: N/A  Eyes: N/A  Respiratory: SOB, chest tightness  Gastroitestinal: N/A  Genitourinary: N/A Hematology/Lymphatic: N/A  Endocrine: N/A  Musculoskeletal: N/A  Allergy/Immunology: N/A  Neurological: N/A Psychiatric: nervous and anxious  The following represents the patient's updated allergies and side effects list: Allergies  Allergen Reactions  . Hctz [Hydrochlorothiazide] Other (See Comments)    Renal insufficiency  . Niaspan [Niacin Er] Itching  . Zolpidem Anxiety and Other (See Comments)    Bad dreams    Labs since last visit of relevance include the following: Results for orders placed or performed during the hospital encounter of 06/29/14  CBC  Result Value Ref Range   WBC 6.4 4.0 - 10.5 K/uL   RBC 3.90 (L) 4.22 - 5.81 MIL/uL   Hemoglobin 9.4 (L) 13.0 - 17.0 g/dL   HCT 29.4 (L) 39.0 - 52.0 %   MCV 75.4 (L) 78.0 - 100.0 fL   MCH 24.1 (L) 26.0 - 34.0 pg   MCHC 32.0 30.0 - 36.0 g/dL   RDW 16.7 (H) 11.5 - 15.5 %   Platelets  164 150 - 400 K/uL  Basic metabolic panel  Result Value Ref Range   Sodium 140 137 - 147 mEq/L   Potassium 4.4 3.7 - 5.3 mEq/L   Chloride 104 96 - 112 mEq/L   CO2 25 19 - 32 mEq/L   Glucose, Bld 149 (H) 70 - 99 mg/dL   BUN 22 6 - 23 mg/dL   Creatinine, Ser 1.49 (H) 0.50 - 1.35 mg/dL   Calcium 8.7 8.4 - 10.5 mg/dL   GFR calc non Af Amer 46 (L) >90 mL/min   GFR calc Af Amer 54 (L) >90 mL/min   Anion gap 11 5 - 15  BNP (order ONLY if patient complains of dyspnea/SOB AND you have documented it for THIS visit)  Result  Value Ref Range   Pro B Natriuretic peptide (BNP) 2519.0 (H) 0 - 125 pg/mL  Basic metabolic panel  Result Value Ref Range   Sodium 142 137 - 147 mEq/L   Potassium 4.1 3.7 - 5.3 mEq/L   Chloride 104 96 - 112 mEq/L   CO2 26 19 - 32 mEq/L   Glucose, Bld 114 (H) 70 - 99 mg/dL   BUN 23 6 - 23 mg/dL   Creatinine, Ser 1.57 (H) 0.50 - 1.35 mg/dL   Calcium 8.7 8.4 - 10.5 mg/dL   GFR calc non Af Amer 44 (L) >90 mL/min   GFR calc Af Amer 51 (L) >90 mL/min   Anion gap 12 5 - 15  Glucose, capillary  Result Value Ref Range   Glucose-Capillary 77 70 - 99 mg/dL   Comment 1 Notify RN    Comment 2 Documented in Chart   Glucose, capillary  Result Value Ref Range   Glucose-Capillary 116 (H) 70 - 99 mg/dL  Glucose, capillary  Result Value Ref Range   Glucose-Capillary 133 (H) 70 - 99 mg/dL   Comment 1 Documented in Chart    Comment 2 Notify RN   I-stat troponin, ED (not at [facility])  Result Value Ref Range   Troponin i, poc 0.00 0.00 - 0.08 ng/mL   Comment 3            The neurologically relevant items on the patient's problem list were reviewed on today's visit.  Neurologic Examination  A problem focused neurological exam (12 or more points of the single system neurologic examination, vital signs counts as 1 point, cranial nerves count for 8 points) was performed.  Blood pressure 140/49, pulse 45, height 5' 10.5" (1.791 m), weight 238 lb 9.6 oz (108.228 kg).  General - Well nourished, well developed, in no apparent distress.  Ophthalmologic - Sharp disc margins OU.  Cardiovascular - Regular rate and rhythm with no murmur.  Mental Status -  Level of arousal and orientation to time, place, and person were intact. Language including expression, naming, repetition, comprehension was assessed and found intact. Attention span and concentration were impaired. Fund of Knowledge was assessed and was impaired.  Cranial Nerves II - XII - II - Visual field intact OU. III, IV, VI - Extraocular  movements intact. V - Facial sensation intact bilaterally. VII - Facial movement intact bilaterally. VIII - Hearing & vestibular intact bilaterally. X - Palate elevates symmetrically. XI - Chin turning & shoulder shrug intact bilaterally. XII - Tongue protrusion intact.  Motor Strength - The patient's strength was normal in all extremities and pronator drift was absent.  Bulk was normal and fasciculations were absent.   Motor Tone - Muscle tone was assessed at the  neck and appendages and was normal.  Reflexes - The patient's reflexes were normal in all extremities and he had no pathological reflexes.  Sensory - Light touch, temperature/pinprick were assessed and were normal.    Coordination - The patient had normal movements in the hands and feet with no ataxia or dysmetria.  Tremor was absent.  Gait and Station - large based and slow gait were observed as normal.  Data reviewed: I personally reviewed the images and agree with the radiology interpretations.  Ct Head Wo Contrast  04/21/2014 IMPRESSION: 1. No acute intracranial abnormality. 2. Atrophy and chronic microvascular white matter ischemic changes.   CTA head and neck 11/13/10 1. 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 Stable anterior communicating artery aneurysm 29mm. 3. No acute or focal abnormality otherwise. 4. Remote encephalomalacia of the right occipital lobe. 5. Areas of remote encephalomalacia and calcification in the posterior left frontal lobe.  CUS 01/2013  This is an abnormal carotid duplex ultrasound with color flow imaging. .  There is 50-75% stenosis of the left ICA by Doppler criteria, although this  is likely at the low end of the range. No severe luminal narrowing noted,  although there is moderate plaque. The Vertebrals demonstrate foward flow  bilaterally . Insonation at multiple depths was attempted of the  [facility]. This is an abnormal  Transcranial  Doppler Ultrasound examination. The right ACA velocity is slightly elevated,  but does not meet criteria for stenosis and most likely reflects hyperemia or  collateral flow. The resistance is increased in multiple vessels, which may  point to diffuse atherosclerosis or distal small vessel disease. However,  there is no evidence of stenosis, vasospasm, or flow diversion. There was no  previous study available for direct comparison.   CUS 04/2014 - 1-39% internal carotid artery stenosis bilaterally. Vertebral arteries are patent with antegrade flow.   TEE - Impression: No vegetations seen EF 35-40%  + PFO ( markedly positive)  He is bradycardic and LV function is moderately reduced.  I have DCd his Diltiazem for now..  Transthoracic echo to be done.   TTE - Left ventricle: Systolic function was moderately to severely reduced. The estimated ejection fraction was in the range of 30% to 35%. - Aortic valve: No evidence of vegetation. - Mitral valve: No evidence of vegetation. - Tricuspid valve: No evidence of vegetation.   EEG - Summary This is a severely abnormal EEG due to the presence of severe background slowing indicative of bihemispheric dysfunction which is a nonspecific finding seen in a variety of degenerative, toxic, metabolic and ischemic etiologies. No definite epileptiform activity is noted.   MRI head and MRA neck and head 04/23/14 Negative for acute infarct Numerous areas of chronic hemorrhage in the brain. Question  hypertension versus cerebral amyloid.  Diffuse intracranial atherosclerotic disease without large vessel occlusion.  2 mm aneurysm anterior communicating artery.  Question severe stenosis proximal right common carotid artery versus  artifact. Favor artifact. Both carotid bifurcations are patent  bilaterally  Atherosclerotic disease in the proximal vertebral arteries. Focal  area of signal loss in the proximal right vertebral artery may be    due to severe stenosis or artifact.   Component     Latest Ref Rng 04/16/2014 04/24/2014  Cholesterol, Total     100 - 199 mg/dL 142   Triglycerides     0 - 149 mg/dL 196 (H)   HDL     >39 mg/dL 25 (L)   VLDL  Cholesterol Cal     5 - 40 mg/dL 39   LDL (calc)     0 - 99 mg/dL 78   Total CHOL/HDL Ratio     0.0 - 5.0 ratio units 5.7 (H)   TSH     0.450 - 4.500 uIU/mL 1.400   Free T4     0.82 - 1.77 ng/dL 1.56   Hemoglobin A1C     4.8 - 5.6 % 13.7 (H)   Estimated average glucose      346   Vitamin B-12     211 - 911 pg/mL  1640 (H)    Assessment: As you may recall, he is a 69 y.o. 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 MRI negative for acute stroke, TEE no vegetation and EEG no seizure. His neuro presentation was thought to be recrudescence of his old right occipital stroke. He recovered well after glucose undercontrol. On follow up, his neuro stable and no recurrent neuro symptoms. He has CHF and following with local cardiologist.  Plan:  - continue ASA 325mg  and lipitor for stroke prevention - continue to follow up with cardiology for CHF and HTN - Follow up with your primary care physician for stroke risk factor modification. Recommend maintain blood pressure goal <130/80, diabetes with hemoglobin A1c goal below 6.5% and lipids with LDL cholesterol goal below 70 mg/dL.  - check BP and glucose at home - recommend to contact Dr. Cleotilde Neer office for appointment of right cavernous ICA aneurysm management. - RTC in 4 months.  No orders of the defined types were placed in this encounter.    Meds ordered this encounter  Medications  . DISCONTD: aspirin 81 MG tablet    Sig: Take 81 mg by mouth daily.  . furosemide (LASIX) 40 MG tablet     Sig: Take 40 mg by mouth daily.  Marland Kitchen DISCONTD: diltiazem (DILACOR XR) 240 MG 24 hr capsule    Sig: Take 240 mg by mouth daily.  . metoprolol succinate (TOPROL-XL) 50 MG 24 hr tablet    Sig: Take 50 mg by mouth daily. Take with or immediately following a meal.  . DISCONTD: sitaGLIPtin (JANUVIA) 50 MG tablet    Sig: Take 50 mg by mouth daily.  Marland Kitchen glimepiride (AMARYL) 1 MG tablet    Sig: Take 1 mg by mouth daily with breakfast.  . aspirin EC 325 MG tablet    Sig: Take 1 tablet (325 mg total) by mouth daily.    Dispense:  30 tablet    Refill:  0    Patient Instructions  - continue ASA 325mg  and lipitor for stroke prevention - continue to follow up with cardiology for CHF and HTN - Follow up with your primary care physician for stroke risk factor modification. Recommend maintain blood pressure goal <130/80, diabetes with hemoglobin A1c goal below 6.5% and lipids with LDL cholesterol goal below 70 mg/dL.  - check BP and glucose at home - healthy and low salt diet - when you feel comfortable, give Dr. Cleotilde Neer office a call for appointment of your aneurysm management. - follow up in 4 months.    Rosalin Hawking, MD PhD [facility] [address] [place],  [address] [phone]

## 2014-09-27 NOTE — Patient Instructions (Signed)
-   continue ASA 325mg  and lipitor for stroke prevention - continue to follow up with cardiology for CHF and HTN - Follow up with your primary care physician for stroke risk factor modification. Recommend maintain blood pressure goal <130/80, diabetes with hemoglobin A1c goal below 6.5% and lipids with LDL cholesterol goal below 70 mg/dL.  - check BP and glucose at home - healthy and low salt diet - when you feel comfortable, give Dr. Cleotilde Neer office a call for appointment of your aneurysm management. - follow up in 4 months.

## 2014-10-01 DIAGNOSIS — E1165 Type 2 diabetes mellitus with hyperglycemia: Secondary | ICD-10-CM | POA: Diagnosis not present

## 2014-10-06 ENCOUNTER — Telehealth: Payer: Self-pay | Admitting: Neurology

## 2014-10-06 NOTE — Telephone Encounter (Signed)
Patient requesting a referral to Dr. Cleotilde Neer office as discussed at [facility] 09/27/14.  Please for referral to [facility] @ [phone].  Please call and advise.

## 2014-10-07 NOTE — Telephone Encounter (Signed)
Hi, Tashia:  Could you please contact Dr. Cleotilde Neer office and make an appointment for him? Thanks.  Rosalin Hawking, MD PhD Stroke Neurology 10/07/2014 1:35 PM

## 2014-10-12 NOTE — Telephone Encounter (Signed)
Called Dr Cleotilde Neer office and left a voice message for the new patient coordinator to call back to get patient an appointment.

## 2014-10-13 NOTE — Telephone Encounter (Signed)
Thank you, Tashia. If not able to make appointment with Dr. Kathyrn Sheriff, please refer the pt to Dr. Estanislado Pandy. Thanks.  Rosalin Hawking, MD PhD Stroke Neurology 10/13/2014 10:21 AM

## 2014-10-13 NOTE — Telephone Encounter (Signed)
Spoke with Gerald Stabs at Dr Cleotilde Neer office and she states that they originally tried to schedule with patient back in August but was informed by wife that patient was in the hospital, and wife told Gerald Stabs that she will call back later to schedule with them but never did. Patient was seen by Dr Erlinda Hong in January and requested referral, informed Gerald Stabs that I will fax over referral and notes to her so that she can contact patient and schedule appointment with them.

## 2014-10-18 ENCOUNTER — Ambulatory Visit: Payer: Medicare Other | Admitting: Neurology

## 2014-10-20 DIAGNOSIS — I671 Cerebral aneurysm, nonruptured: Secondary | ICD-10-CM | POA: Diagnosis not present

## 2014-10-20 DIAGNOSIS — Z6834 Body mass index (BMI) 34.0-34.9, adult: Secondary | ICD-10-CM | POA: Diagnosis not present

## 2014-10-22 ENCOUNTER — Other Ambulatory Visit (HOSPITAL_COMMUNITY): Payer: Self-pay | Admitting: Interventional Radiology

## 2014-10-22 ENCOUNTER — Other Ambulatory Visit (HOSPITAL_COMMUNITY): Payer: Self-pay | Admitting: Neurosurgery

## 2014-10-22 DIAGNOSIS — I671 Cerebral aneurysm, nonruptured: Secondary | ICD-10-CM

## 2014-10-27 DIAGNOSIS — E119 Type 2 diabetes mellitus without complications: Secondary | ICD-10-CM | POA: Diagnosis not present

## 2014-10-27 DIAGNOSIS — I509 Heart failure, unspecified: Secondary | ICD-10-CM | POA: Diagnosis not present

## 2014-10-27 DIAGNOSIS — I10 Essential (primary) hypertension: Secondary | ICD-10-CM | POA: Diagnosis not present

## 2014-11-15 DIAGNOSIS — R0602 Shortness of breath: Secondary | ICD-10-CM | POA: Diagnosis not present

## 2014-11-15 DIAGNOSIS — E119 Type 2 diabetes mellitus without complications: Secondary | ICD-10-CM | POA: Diagnosis not present

## 2014-11-15 DIAGNOSIS — N19 Unspecified kidney failure: Secondary | ICD-10-CM | POA: Diagnosis not present

## 2014-11-15 DIAGNOSIS — E780 Pure hypercholesterolemia: Secondary | ICD-10-CM | POA: Diagnosis not present

## 2014-11-15 DIAGNOSIS — I509 Heart failure, unspecified: Secondary | ICD-10-CM | POA: Diagnosis not present

## 2014-11-15 DIAGNOSIS — D649 Anemia, unspecified: Secondary | ICD-10-CM | POA: Diagnosis not present

## 2014-11-15 DIAGNOSIS — Z8673 Personal history of transient ischemic attack (TIA), and cerebral infarction without residual deficits: Secondary | ICD-10-CM | POA: Diagnosis not present

## 2014-11-15 DIAGNOSIS — I10 Essential (primary) hypertension: Secondary | ICD-10-CM | POA: Diagnosis not present

## 2014-11-23 DIAGNOSIS — E1165 Type 2 diabetes mellitus with hyperglycemia: Secondary | ICD-10-CM | POA: Diagnosis not present

## 2014-11-25 DIAGNOSIS — D509 Iron deficiency anemia, unspecified: Secondary | ICD-10-CM | POA: Diagnosis not present

## 2014-11-30 DIAGNOSIS — Z1389 Encounter for screening for other disorder: Secondary | ICD-10-CM | POA: Diagnosis not present

## 2014-11-30 DIAGNOSIS — N289 Disorder of kidney and ureter, unspecified: Secondary | ICD-10-CM | POA: Diagnosis not present

## 2014-11-30 DIAGNOSIS — D638 Anemia in other chronic diseases classified elsewhere: Secondary | ICD-10-CM | POA: Diagnosis not present

## 2014-11-30 DIAGNOSIS — I509 Heart failure, unspecified: Secondary | ICD-10-CM | POA: Diagnosis not present

## 2014-12-01 DIAGNOSIS — D631 Anemia in chronic kidney disease: Secondary | ICD-10-CM | POA: Diagnosis not present

## 2014-12-01 DIAGNOSIS — N2581 Secondary hyperparathyroidism of renal origin: Secondary | ICD-10-CM | POA: Diagnosis not present

## 2014-12-01 DIAGNOSIS — N183 Chronic kidney disease, stage 3 (moderate): Secondary | ICD-10-CM | POA: Diagnosis not present

## 2014-12-06 DIAGNOSIS — R972 Elevated prostate specific antigen [PSA]: Secondary | ICD-10-CM | POA: Diagnosis not present

## 2014-12-06 DIAGNOSIS — N401 Enlarged prostate with lower urinary tract symptoms: Secondary | ICD-10-CM | POA: Diagnosis not present

## 2014-12-21 DIAGNOSIS — H2513 Age-related nuclear cataract, bilateral: Secondary | ICD-10-CM | POA: Diagnosis not present

## 2014-12-21 DIAGNOSIS — E119 Type 2 diabetes mellitus without complications: Secondary | ICD-10-CM | POA: Diagnosis not present

## 2014-12-22 DIAGNOSIS — D509 Iron deficiency anemia, unspecified: Secondary | ICD-10-CM | POA: Diagnosis not present

## 2014-12-27 ENCOUNTER — Encounter (HOSPITAL_COMMUNITY)
Admission: RE | Admit: 2014-12-27 | Discharge: 2014-12-27 | Disposition: A | Discharge status: Home / Self Care | Payer: Medicare Other | Source: Ambulatory Visit | Attending: Neurosurgery | Admitting: Neurosurgery

## 2014-12-27 ENCOUNTER — Encounter (HOSPITAL_COMMUNITY): Payer: Self-pay

## 2014-12-27 ENCOUNTER — Encounter (HOSPITAL_COMMUNITY)
Admission: RE | Admit: 2014-12-27 | Discharge: 2014-12-27 | Disposition: A | Discharge status: Home / Self Care | Payer: Medicare Other | Source: Ambulatory Visit | Attending: Anesthesiology | Admitting: Anesthesiology

## 2014-12-27 ENCOUNTER — Ambulatory Visit (HOSPITAL_COMMUNITY)
Admission: RE | Admit: 2014-12-27 | Discharge: 2014-12-27 | Disposition: A | Discharge status: Home / Self Care | Payer: Medicare Other | Source: Ambulatory Visit | Attending: Neurosurgery | Admitting: Neurosurgery

## 2014-12-27 DIAGNOSIS — Z794 Long term (current) use of insulin: Secondary | ICD-10-CM | POA: Insufficient documentation

## 2014-12-27 DIAGNOSIS — Z79899 Other long term (current) drug therapy: Secondary | ICD-10-CM | POA: Insufficient documentation

## 2014-12-27 DIAGNOSIS — D649 Anemia, unspecified: Secondary | ICD-10-CM | POA: Insufficient documentation

## 2014-12-27 DIAGNOSIS — Z7902 Long term (current) use of antithrombotics/antiplatelets: Secondary | ICD-10-CM | POA: Diagnosis not present

## 2014-12-27 DIAGNOSIS — G4730 Sleep apnea, unspecified: Secondary | ICD-10-CM | POA: Diagnosis not present

## 2014-12-27 DIAGNOSIS — I129 Hypertensive chronic kidney disease with stage 1 through stage 4 chronic kidney disease, or unspecified chronic kidney disease: Secondary | ICD-10-CM | POA: Insufficient documentation

## 2014-12-27 DIAGNOSIS — Z01818 Encounter for other preprocedural examination: Secondary | ICD-10-CM | POA: Insufficient documentation

## 2014-12-27 DIAGNOSIS — K219 Gastro-esophageal reflux disease without esophagitis: Secondary | ICD-10-CM | POA: Insufficient documentation

## 2014-12-27 DIAGNOSIS — E780 Pure hypercholesterolemia: Secondary | ICD-10-CM | POA: Diagnosis not present

## 2014-12-27 DIAGNOSIS — I509 Heart failure, unspecified: Secondary | ICD-10-CM | POA: Insufficient documentation

## 2014-12-27 DIAGNOSIS — E785 Hyperlipidemia, unspecified: Secondary | ICD-10-CM | POA: Insufficient documentation

## 2014-12-27 DIAGNOSIS — E1140 Type 2 diabetes mellitus with diabetic neuropathy, unspecified: Secondary | ICD-10-CM | POA: Diagnosis not present

## 2014-12-27 DIAGNOSIS — I69354 Hemiplegia and hemiparesis following cerebral infarction affecting left non-dominant side: Secondary | ICD-10-CM | POA: Insufficient documentation

## 2014-12-27 DIAGNOSIS — Z01812 Encounter for preprocedural laboratory examination: Secondary | ICD-10-CM | POA: Insufficient documentation

## 2014-12-27 DIAGNOSIS — Z7982 Long term (current) use of aspirin: Secondary | ICD-10-CM | POA: Diagnosis not present

## 2014-12-27 DIAGNOSIS — Z8614 Personal history of Methicillin resistant Staphylococcus aureus infection: Secondary | ICD-10-CM | POA: Diagnosis not present

## 2014-12-27 DIAGNOSIS — N189 Chronic kidney disease, unspecified: Secondary | ICD-10-CM | POA: Insufficient documentation

## 2014-12-27 DIAGNOSIS — J811 Chronic pulmonary edema: Secondary | ICD-10-CM | POA: Diagnosis not present

## 2014-12-27 DIAGNOSIS — Z87891 Personal history of nicotine dependence: Secondary | ICD-10-CM | POA: Insufficient documentation

## 2014-12-27 DIAGNOSIS — I671 Cerebral aneurysm, nonruptured: Secondary | ICD-10-CM | POA: Insufficient documentation

## 2014-12-27 DIAGNOSIS — J90 Pleural effusion, not elsewhere classified: Secondary | ICD-10-CM | POA: Diagnosis not present

## 2014-12-27 DIAGNOSIS — Z01811 Encounter for preprocedural respiratory examination: Secondary | ICD-10-CM

## 2014-12-27 HISTORY — DX: Personal history of other specified conditions: Z87.898

## 2014-12-27 HISTORY — DX: Personal history of other diseases of the digestive system: Z87.19

## 2014-12-27 HISTORY — DX: Failed or difficult intubation, initial encounter: T88.4XXA

## 2014-12-27 HISTORY — DX: Iron deficiency anemia, unspecified: D50.9

## 2014-12-27 HISTORY — DX: Urgency of urination: R39.15

## 2014-12-27 HISTORY — DX: Seborrheic dermatitis, unspecified: L21.9

## 2014-12-27 HISTORY — DX: Post-traumatic stress disorder, chronic: F43.12

## 2014-12-27 HISTORY — DX: Reserved for inherently not codable concepts without codable children: IMO0001

## 2014-12-27 HISTORY — DX: Nocturia: R35.1

## 2014-12-27 HISTORY — DX: Polyneuropathy, unspecified: G62.9

## 2014-12-27 HISTORY — DX: Cerebral aneurysm, nonruptured: I67.1

## 2014-12-27 HISTORY — DX: Chronic kidney disease, unspecified: N18.9

## 2014-12-27 HISTORY — DX: Anemia, unspecified: D64.9

## 2014-12-27 HISTORY — DX: Pure hypercholesterolemia, unspecified: E78.00

## 2014-12-27 HISTORY — DX: Unspecified cataract: H26.9

## 2014-12-27 HISTORY — DX: Gastro-esophageal reflux disease without esophagitis: K21.9

## 2014-12-27 HISTORY — DX: Benign prostatic hyperplasia with lower urinary tract symptoms: N40.1

## 2014-12-27 LAB — PROTIME-INR
INR: 1.12 (ref 0.00–1.49)
PROTHROMBIN TIME: 14.5 s (ref 11.6–15.2)

## 2014-12-27 LAB — CBC
HCT: 35.9 % — ABNORMAL LOW (ref 39.0–52.0)
Hemoglobin: 11.1 g/dL — ABNORMAL LOW (ref 13.0–17.0)
MCH: 22.7 pg — ABNORMAL LOW (ref 26.0–34.0)
MCHC: 30.9 g/dL (ref 30.0–36.0)
MCV: 73.3 fL — ABNORMAL LOW (ref 78.0–100.0)
PLATELETS: 166 10*3/uL (ref 150–400)
RBC: 4.9 MIL/uL (ref 4.22–5.81)
RDW: 19.6 % — ABNORMAL HIGH (ref 11.5–15.5)
WBC: 6.4 10*3/uL (ref 4.0–10.5)

## 2014-12-27 LAB — BASIC METABOLIC PANEL
Anion gap: 8 (ref 5–15)
BUN: 36 mg/dL — AB (ref 6–23)
CO2: 24 mmol/L (ref 19–32)
CREATININE: 1.74 mg/dL — AB (ref 0.50–1.35)
Calcium: 8.9 mg/dL (ref 8.4–10.5)
Chloride: 106 mmol/L (ref 96–112)
GFR calc Af Amer: 44 mL/min — ABNORMAL LOW (ref >90)
GFR, EST NON AFRICAN AMERICAN: 38 mL/min — AB (ref >90)
GLUCOSE: 237 mg/dL — AB (ref 70–99)
POTASSIUM: 5.3 mmol/L — AB (ref 3.5–5.1)
Sodium: 138 mmol/L (ref 135–145)

## 2014-12-27 LAB — APTT: aPTT: 29 seconds (ref 24–37)

## 2014-12-27 NOTE — Pre-Procedure Instructions (Signed)
Storme Lien  12/27/2014   Your procedure is scheduled on:  Thursday, May 5  Report to [facility] Admitting at 0600 AM.  Call this number if you have problems the morning of surgery: [phone]   Remember:   Do not eat food or drink liquids after midnight. Wednesday night   Take these medicines the morning of surgery with A SIP OF WATER: amlodipine (Norvasc), aspirin, citalopram (Celexa), Nexium, hydralazine (Apresoline), metoprolol (Toprol-XL), tamsulosin(Flomax)   Do not wear jewelry.  Do not wear lotions, powders, or perfumes. You may  Not wear deodorant.  Do not shave 48 hours prior to surgery. Men may shave face and neck.  Do not bring valuables to the hospital.  [facility] is not responsible    for any belongings or valuables.               Contacts, dentures or bridgework may not be worn into surgery.  Leave suitcase in the car. After surgery it may be brought to your room.  For patients admitted to the hospital, discharge time is determined by your      treatment team.            Special Instructions: [facility] - Preparing for Surgery  Before surgery, you can play an important role.  Because skin is not sterile, your skin needs to be as free of germs as possible.  You can reduce the number of germs on you skin by washing with CHG (chlorahexidine gluconate) soap before surgery.  CHG is an antiseptic cleaner which kills germs and bonds with the skin to continue killing germs even after washing.  Please DO NOT use if you have an allergy to CHG or antibacterial soaps.  If your skin becomes reddened/irritated stop using the CHG and inform your nurse when you arrive at Short Stay.  Do not shave (including legs and underarms) for at least 48 hours prior to the first CHG shower.  You may shave your face.  Please follow these instructions carefully:   1.  Shower with CHG Soap the night before surgery and the  morning of Surgery.  2.  If you choose to wash your hair,  wash your hair first as usual with your  normal shampoo.  3.  After you shampoo, rinse your hair and body thoroughly to remove the   Shampoo.  4.  Use CHG as you would any other liquid soap.  You can apply chg directly   to the skin and wash gently with scrungie or a clean washcloth.  5.  Apply the CHG Soap to your body ONLY FROM THE NECK DOWN.   Do not use on open wounds or open sores.  Avoid contact with your eyes, ears, mouth and genitals (private parts).  Wash genitals (private parts)    with your normal soap.  6.  Wash thoroughly, paying special attention to the area where your surgery  will be performed.  7.  Thoroughly rinse your body with warm water from the neck down.  8.  DO NOT shower/wash with your normal soap after using and rinsing off    the CHG Soap.  9.  Pat yourself dry with a clean towel.            10.  Wear clean pajamas.            11.  Place clean sheets on your bed the night of your first shower and do not   sleep with pets.  Day of Surgery  Do not apply any lotions/deoderants the morning of surgery.  Please wear clean clothes to the hospital/surgery center.     Please read over the following fact sheets that you were given: Pain Booklet, Coughing and Deep Breathing and Surgical Site Infection Prevention

## 2014-12-27 NOTE — Progress Notes (Addendum)
Anesthesia Note:  Patient is a 69 year old male scheduled for pipeline embolization (cerebral aneurysm) on 01/06/15 by Dr. Kathyrn Sheriff.  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 Dr. 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 Admission 04/2014 with DKA/uncontrolled DM2, left hemianopsia, acute on chronic renal failure and admission 06/2014 for acute on chronic systolic CHF. ED visit at [facility] 11/15/14 for CHF exacerbation and was found to be anemic.  Hematology and cardiology follow-up recommended. Rare ETOH use since 2010. BMI is consistent with obesity.   ANESTHESIA HISTORY: DIFFICULT INTUBATION on 01/27/13 at [facility] (see Care Everywhere). Records state:  Airway Type: Difficult POSITIONING: Note: 2 handed mask with oral airway. DL x 1 with MAC 4. Unable to see under epiglottis. Attempted bougie but felt like it went into esophagus. DL x 1 with MIL 3. Able to lift epiglottis but could not see any recognizable supraglottic or glottic landmarks. VL x 1 with #5 Glidescope. Vocal cords seen but very anterior. Significant anterior bend required to advance through cords and stylet had to be pulled back as advancing to get the ET to go through. Will give patient difficult intubation letter. Wynona Dove MD Ability to Mask Ventilate Patient: Moderately Difficult LARYNGOSCOPY: Blade(s) Used: Mac 4 and Miller 3  Attempts: Three Grade of View: Four Intubation Trauma: None ETT Size/Location: 8.0 and Cuffed Secured with: Fabric Tape Secured at (cm): 23 Airway Adjuncts: Glidescope   PCP is Dr. Lin Landsman with  [facility]. Nephrologist is Dr. Justin Mend with [facility]. Neurologist is Dr. Erlinda Hong with [facility]. [facility] Cardiologist was Dr. Fay Records, but sees Dr. Geraldo Pitter in [place] visit within the past six months, records pending. Hematologist is Dr. Hinton Rao.  Meds include amlodipine, ASA, Lipitor, Celexa, Vitamin D2, Amaryl, Nexium, Lasix, Hemocyte Plus, hydralazine, Lantus, Toprol, Flomax.   TEE 04/22/14 (Dr. Acie Fredrickson): - Left ventricle: Systolic function was moderately to severely reduced. The estimated ejection fraction was in the range of 30% to 35%. - Aortic valve: No evidence of vegetation. - Mitral valve: No evidence of vegetation. - Tricuspid valve: No evidence of vegetation. (LVEF 03/04/14 at [facility] was 55-60%. I confirmed that he has not had a repeat echo since 04/2014 at [place], [place], [place], or [facility].)  01/27/13 Cardiac cath [facility] Everywhere): Mild non-obstructive CAD (20% mid LAD, 20% distal OM1).   04/22/14 Carotid duplex: Study was technically difficult due to poor patient cooperation. Findings suggest 1-39% internal carotid artery stenosis bilaterally. The left vertebral artery is patent with antegrade flow. Unable to visualize the right vertebral artery.  04/23/14 MRI/MRA Head/Brain: IMPRESSION: Negative for acute infarct. Numerous areas of chronic hemorrhage in the brain. Question hypertension versus cerebral amyloid. Diffuse intracranial atherosclerotic disease without large vessel occlusion. 2 mm aneurysm anterior communicating artery. Question severe stenosis proximal right common carotid artery versus artifact. Favor artifact. Both carotid bifurcations are patent bilaterally Atherosclerotic disease in the proximal vertebral arteries. Focal area of signal loss in the proximal right vertebral artery may be due to severe stenosis or artifact.  12/27/14 CXR: FINDINGS: Mediastinum and hilar structures normal. Prior cardiac  valve replacements. Cardiomegaly. Improvement of pulmonary venous congestion and interstitial prominence. No focal infiltrate No pleural  effusion or pneumothorax. IMPRESSION: 1. Interim improvement of pulmonary venous congestion, interstitial prominence, and right effusion suggesting improving congestive heart failure. 2. Cardiac valve replacements.  04/06/14 best PFTs: FVC 2.50 (56%), FEV1 2.15 (62%), FEF25-75% 2.39 (76%). Moderately severe restriction.   Preoperative labs noted.  BUN 36, Cr 1.74, appears within higher end of his baseline (he is actively being followed by nephrologist Dr. Justin Mend). H/H 11.1/35.9. PT/PTT WNL.  Glucose 237--he reports fasting glucose ~ 150's.   Patient reports SOB is significantly improved since his anemia has been corrected (HGB was in the low 7 range, now 10-11).  Denied chest pain, syncope, significant palpitations. Reports weight is overall steady, although was told he will likely need to stay on Lasix 40 mg daily, because he was having volume issues on just 20 mg. On exam, heart regular, mildly bradycardic.  No murmur noted.  No carotid bruit noted.  Lungs clear.  Up to 1+ pre-tibial edema. I think Mallampati is grade III.  Large tongue. (We discussed that he may require glidescope for this procedure since it was used in the past.)  Additional input following receipt of cardiology records.  I also left a voice message with Manuela Schwartz at Dr. Cleotilde Neer office regarding patient inquiries about anti-platelet therapy for this procedure.  Patient is also contacting his office.  George [facility] Short Stay Center/Anesthesiology Phone [phone] 12/27/2014 3:28 PM  Addendum: Dr. Ervin Knack last office note is pending, but staff there said he was seen last month. I reviewed available records with anesthesiologist Dr. Kalman Shan.  Due to patient's cardiac history, depressed LVF by 04/2014 echo, recurrent evaluations for CHF and SOB including ED evaluation 11/15/14 and  hospitalization 06/2014, Dr. Kalman Shan would like a BNP done on arrival (order entered). He also recommended that I touch base with Dr. Geraldo Pitter.  I called and spoke with Dr. Geraldo Pitter.  We discussed patient's planned procedure and 04/2014 and 03/2014 echo results and my evaluation findings yesterday.  He reported that in the future he would plan to repeat echo, but if patient was without acute CHF symptoms and if he was able to walk for at least 15 minutes at a time (including ability to walk in a store, etc), he felt patient could proceed with this procedure without additional pre-operative testing and recommended patient walk 15 minutes twice a day. Dr. Wilber Oliphant felt that if patient was able to tolerate this level of activity without any significant CV symptoms that  he would not change his recommendations for this procedure even if patient's EF had not fully recovered.  I called patient to confirm activity tolerance.  He states that late last year he could barely walk to his mailbox, but now he has been able to walk for at least 15 minutes and even spent an hour in the store walking around recently. I have updated Dr. Kalman Shan.  Further evaluation by his anesthesiologist on the day of surgery to ensure no new changes prior to his procedure. Of note, I did confirm with Manuela Schwartz at Dr. Cleotilde Neer office that their staff did get in touch with patient regarding pre-operative anti-platelet therapy instructions.  George Hugh [facility] Short Stay Center/Anesthesiology Phone [phone] 12/28/2014 4:15 PM

## 2014-12-29 NOTE — Progress Notes (Signed)
Re requested last office visit from [place] with [facility]iology

## 2015-01-05 ENCOUNTER — Other Ambulatory Visit: Payer: Self-pay | Admitting: Radiology

## 2015-01-05 MED ORDER — CEFAZOLIN SODIUM-DEXTROSE 2-3 GM-% IV SOLR
2.0000 g | INTRAVENOUS | Status: AC
  Administered 2015-01-06: 2 g via INTRAVENOUS
  Filled 2015-01-05: qty 50

## 2015-01-06 ENCOUNTER — Inpatient Hospital Stay (HOSPITAL_COMMUNITY)
Admission: RE | Admit: 2015-01-06 | Discharge: 2015-01-07 | DRG: 027 | Disposition: A | Discharge status: Home / Self Care | Payer: Medicare Other | Source: Ambulatory Visit | Attending: Neurosurgery | Admitting: Neurosurgery

## 2015-01-06 ENCOUNTER — Encounter (HOSPITAL_COMMUNITY)
Admission: RE | Disposition: A | Discharge status: Home / Self Care | Payer: Self-pay | Source: Ambulatory Visit | Attending: Neurosurgery

## 2015-01-06 ENCOUNTER — Inpatient Hospital Stay (HOSPITAL_COMMUNITY): Payer: Medicare Other | Admitting: Vascular Surgery

## 2015-01-06 ENCOUNTER — Inpatient Hospital Stay (HOSPITAL_COMMUNITY): Payer: Medicare Other | Admitting: Certified Registered Nurse Anesthetist

## 2015-01-06 ENCOUNTER — Encounter (HOSPITAL_COMMUNITY): Payer: Self-pay | Admitting: Anesthesiology

## 2015-01-06 DIAGNOSIS — D509 Iron deficiency anemia, unspecified: Secondary | ICD-10-CM | POA: Diagnosis not present

## 2015-01-06 DIAGNOSIS — Z7982 Long term (current) use of aspirin: Secondary | ICD-10-CM | POA: Diagnosis not present

## 2015-01-06 DIAGNOSIS — I129 Hypertensive chronic kidney disease with stage 1 through stage 4 chronic kidney disease, or unspecified chronic kidney disease: Secondary | ICD-10-CM | POA: Diagnosis present

## 2015-01-06 DIAGNOSIS — Z7902 Long term (current) use of antithrombotics/antiplatelets: Secondary | ICD-10-CM

## 2015-01-06 DIAGNOSIS — I509 Heart failure, unspecified: Secondary | ICD-10-CM | POA: Diagnosis not present

## 2015-01-06 DIAGNOSIS — E119 Type 2 diabetes mellitus without complications: Secondary | ICD-10-CM | POA: Diagnosis present

## 2015-01-06 DIAGNOSIS — H269 Unspecified cataract: Secondary | ICD-10-CM | POA: Diagnosis present

## 2015-01-06 DIAGNOSIS — Z79899 Other long term (current) drug therapy: Secondary | ICD-10-CM

## 2015-01-06 DIAGNOSIS — N189 Chronic kidney disease, unspecified: Secondary | ICD-10-CM | POA: Diagnosis present

## 2015-01-06 DIAGNOSIS — F419 Anxiety disorder, unspecified: Secondary | ICD-10-CM | POA: Diagnosis present

## 2015-01-06 DIAGNOSIS — I729 Aneurysm of unspecified site: Secondary | ICD-10-CM

## 2015-01-06 DIAGNOSIS — E1140 Type 2 diabetes mellitus with diabetic neuropathy, unspecified: Secondary | ICD-10-CM | POA: Diagnosis not present

## 2015-01-06 DIAGNOSIS — I671 Cerebral aneurysm, nonruptured: Principal | ICD-10-CM | POA: Diagnosis present

## 2015-01-06 DIAGNOSIS — K219 Gastro-esophageal reflux disease without esophagitis: Secondary | ICD-10-CM | POA: Diagnosis present

## 2015-01-06 DIAGNOSIS — Z794 Long term (current) use of insulin: Secondary | ICD-10-CM

## 2015-01-06 DIAGNOSIS — N400 Enlarged prostate without lower urinary tract symptoms: Secondary | ICD-10-CM | POA: Diagnosis present

## 2015-01-06 DIAGNOSIS — E785 Hyperlipidemia, unspecified: Secondary | ICD-10-CM | POA: Diagnosis present

## 2015-01-06 DIAGNOSIS — Z87891 Personal history of nicotine dependence: Secondary | ICD-10-CM

## 2015-01-06 DIAGNOSIS — Z8673 Personal history of transient ischemic attack (TIA), and cerebral infarction without residual deficits: Secondary | ICD-10-CM | POA: Diagnosis not present

## 2015-01-06 DIAGNOSIS — F4310 Post-traumatic stress disorder, unspecified: Secondary | ICD-10-CM | POA: Diagnosis present

## 2015-01-06 DIAGNOSIS — G4730 Sleep apnea, unspecified: Secondary | ICD-10-CM | POA: Diagnosis present

## 2015-01-06 HISTORY — DX: Aneurysm of unspecified site: I72.9

## 2015-01-06 HISTORY — PX: RADIOLOGY WITH ANESTHESIA: SHX6223

## 2015-01-06 HISTORY — DX: Cerebral aneurysm, nonruptured: I67.1

## 2015-01-06 LAB — CBC
HEMATOCRIT: 29.9 % — AB (ref 39.0–52.0)
HEMOGLOBIN: 9.3 g/dL — AB (ref 13.0–17.0)
MCH: 23.4 pg — ABNORMAL LOW (ref 26.0–34.0)
MCHC: 31.1 g/dL (ref 30.0–36.0)
MCV: 75.3 fL — ABNORMAL LOW (ref 78.0–100.0)
PLATELETS: 125 10*3/uL — AB (ref 150–400)
RBC: 3.97 MIL/uL — AB (ref 4.22–5.81)
RDW: 21.1 % — ABNORMAL HIGH (ref 11.5–15.5)
WBC: 7.5 10*3/uL (ref 4.0–10.5)

## 2015-01-06 LAB — POCT I-STAT 4, (NA,K, GLUC, HGB,HCT)
Glucose, Bld: 169 mg/dL — ABNORMAL HIGH (ref 70–99)
Glucose, Bld: 106 mg/dL — ABNORMAL HIGH (ref 70–99)
HCT: 29 % — ABNORMAL LOW (ref 39.0–52.0)
HEMATOCRIT: 16 % — AB (ref 39.0–52.0)
HEMOGLOBIN: 9.9 g/dL — AB (ref 13.0–17.0)
Hemoglobin: 5.4 g/dL — CL (ref 13.0–17.0)
POTASSIUM: 5.1 mmol/L (ref 3.5–5.1)
Potassium: 3 mmol/L — ABNORMAL LOW (ref 3.5–5.1)
SODIUM: 138 mmol/L (ref 135–145)
SODIUM: 147 mmol/L — AB (ref 135–145)

## 2015-01-06 LAB — CREATININE, SERUM
Creatinine, Ser: 1.83 mg/dL — ABNORMAL HIGH (ref 0.61–1.24)
GFR calc Af Amer: 42 mL/min — ABNORMAL LOW (ref >60)
GFR calc non Af Amer: 36 mL/min — ABNORMAL LOW (ref >60)

## 2015-01-06 LAB — GLUCOSE, CAPILLARY
GLUCOSE-CAPILLARY: 159 mg/dL — AB (ref 70–99)
GLUCOSE-CAPILLARY: 150 mg/dL — AB (ref 70–99)
GLUCOSE-CAPILLARY: 81 mg/dL (ref 70–99)
Glucose-Capillary: 148 mg/dL — ABNORMAL HIGH (ref 70–99)
Glucose-Capillary: 98 mg/dL (ref 70–99)

## 2015-01-06 LAB — BRAIN NATRIURETIC PEPTIDE: B Natriuretic Peptide: 301.2 pg/mL — ABNORMAL HIGH (ref 0.0–100.0)

## 2015-01-06 SURGERY — RADIOLOGY WITH ANESTHESIA
Anesthesia: General

## 2015-01-06 MED ORDER — METOPROLOL SUCCINATE ER 25 MG PO TB24
25.0000 mg | ORAL_TABLET | Freq: Two times a day (BID) | ORAL | Status: DC
  Administered 2015-01-06 – 2015-01-07 (×2): 25 mg via ORAL
  Filled 2015-01-06 (×3): qty 1

## 2015-01-06 MED ORDER — CLOPIDOGREL BISULFATE 75 MG PO TABS
75.0000 mg | ORAL_TABLET | Freq: Every day | ORAL | Status: DC
  Administered 2015-01-07: 75 mg via ORAL
  Filled 2015-01-06: qty 1

## 2015-01-06 MED ORDER — INSULIN GLARGINE 100 UNIT/ML SOLOSTAR PEN: 8.0000 [IU] | PEN_INJECTOR | Freq: Every day | SUBCUTANEOUS | Status: DC

## 2015-01-06 MED ORDER — SENNOSIDES-DOCUSATE SODIUM 8.6-50 MG PO TABS
1.0000 | ORAL_TABLET | Freq: Every evening | ORAL | Status: DC | PRN
Start: 2015-01-06 — End: 2015-01-07
  Filled 2015-01-06: qty 1

## 2015-01-06 MED ORDER — PHENYLEPHRINE HCL 10 MG/ML IJ SOLN
10.0000 mg | INTRAVENOUS | Status: DC | PRN
  Administered 2015-01-06: 20 ug/min via INTRAVENOUS

## 2015-01-06 MED ORDER — AMLODIPINE BESYLATE 10 MG PO TABS
10.0000 mg | ORAL_TABLET | Freq: Every day | ORAL | Status: DC
  Administered 2015-01-07: 10 mg via ORAL
  Filled 2015-01-06: qty 1

## 2015-01-06 MED ORDER — HEPARIN SODIUM (PORCINE) 5000 UNIT/ML IJ SOLN
5000.0000 [IU] | Freq: Three times a day (TID) | INTRAMUSCULAR | Status: DC
  Administered 2015-01-07: 5000 [IU] via SUBCUTANEOUS
  Filled 2015-01-06 (×4): qty 1

## 2015-01-06 MED ORDER — FE FUMARATE-B12-VIT C-FA-IFC PO CAPS
1.0000 | ORAL_CAPSULE | Freq: Every day | ORAL | Status: DC
  Administered 2015-01-07: 1 via ORAL
  Filled 2015-01-06 (×2): qty 1

## 2015-01-06 MED ORDER — HEPARIN SODIUM (PORCINE) 1000 UNIT/ML IJ SOLN
INTRAMUSCULAR | Status: DC | PRN
  Administered 2015-01-06: 5000 [IU] via INTRAVENOUS
  Administered 2015-01-06: 1000 [IU] via INTRAVENOUS

## 2015-01-06 MED ORDER — VERAPAMIL HCL 2.5 MG/ML IV SOLN
INTRAVENOUS | Status: AC
  Filled 2015-01-06: qty 4

## 2015-01-06 MED ORDER — GLYCOPYRROLATE 0.2 MG/ML IJ SOLN
INTRAMUSCULAR | Status: AC
  Filled 2015-01-06: qty 2

## 2015-01-06 MED ORDER — INSULIN GLARGINE 100 UNIT/ML ~~LOC~~ SOLN
8.0000 [IU] | Freq: Every day | SUBCUTANEOUS | Status: DC
Start: 2015-01-06 — End: 2015-01-07
  Administered 2015-01-06: 8 [IU] via SUBCUTANEOUS
  Filled 2015-01-06 (×2): qty 0.08

## 2015-01-06 MED ORDER — GLIMEPIRIDE 2 MG PO TABS
2.0000 mg | ORAL_TABLET | Freq: Every day | ORAL | Status: DC
  Administered 2015-01-07: 2 mg via ORAL
  Filled 2015-01-06 (×2): qty 1

## 2015-01-06 MED ORDER — FAMOTIDINE IN NACL 20-0.9 MG/50ML-% IV SOLN
20.0000 mg | Freq: Two times a day (BID) | INTRAVENOUS | Status: DC
  Administered 2015-01-06 – 2015-01-07 (×2): 20 mg via INTRAVENOUS
  Filled 2015-01-06 (×4): qty 50

## 2015-01-06 MED ORDER — ROCURONIUM BROMIDE 100 MG/10ML IV SOLN
INTRAVENOUS | Status: DC | PRN
  Administered 2015-01-06: 15 mg via INTRAVENOUS
  Administered 2015-01-06 (×2): 5 mg via INTRAVENOUS
  Administered 2015-01-06: 25 mg via INTRAVENOUS

## 2015-01-06 MED ORDER — IOHEXOL 300 MG/ML  SOLN
150.0000 mL | Freq: Once | INTRAMUSCULAR | Status: AC | PRN
  Administered 2015-01-06: 70 mL via INTRAVENOUS

## 2015-01-06 MED ORDER — HEMOCYTE PLUS 106-1 MG PO CAPS: ORAL_CAPSULE | Freq: Every day | ORAL | Status: DC

## 2015-01-06 MED ORDER — LIDOCAINE HCL 1 % IJ SOLN
INTRAMUSCULAR | Status: AC
  Filled 2015-01-06: qty 20

## 2015-01-06 MED ORDER — VITAMIN B-12 1000 MCG PO TABS
1000.0000 ug | ORAL_TABLET | Freq: Every day | ORAL | Status: DC
  Administered 2015-01-07: 1000 ug via ORAL
  Filled 2015-01-06: qty 1

## 2015-01-06 MED ORDER — ERGOCALCIFEROL 1.25 MG (50000 UT) PO CAPS: 50000.0000 [IU] | ORAL_CAPSULE | ORAL | Status: DC

## 2015-01-06 MED ORDER — HYDRALAZINE HCL 20 MG/ML IJ SOLN
INTRAMUSCULAR | Status: AC
  Administered 2015-01-06: 40 mg via INTRAVENOUS
  Filled 2015-01-06: qty 1

## 2015-01-06 MED ORDER — SODIUM CHLORIDE 0.9 % IV SOLN
INTRAVENOUS | Status: DC
  Administered 2015-01-06: 75 mL/h via INTRAVENOUS

## 2015-01-06 MED ORDER — NEOSTIGMINE METHYLSULFATE 10 MG/10ML IV SOLN
INTRAVENOUS | Status: DC | PRN
  Administered 2015-01-06: 4 mg via INTRAVENOUS

## 2015-01-06 MED ORDER — CITALOPRAM HYDROBROMIDE 20 MG PO TABS
20.0000 mg | ORAL_TABLET | Freq: Every day | ORAL | Status: DC
  Administered 2015-01-07: 20 mg via ORAL
  Filled 2015-01-06: qty 1

## 2015-01-06 MED ORDER — HYDROCODONE-ACETAMINOPHEN 5-325 MG PO TABS: 1.0000 | ORAL_TABLET | ORAL | Status: DC | PRN

## 2015-01-06 MED ORDER — PROPOFOL 10 MG/ML IV BOLUS
INTRAVENOUS | Status: DC | PRN
  Administered 2015-01-06: 150 mg via INTRAVENOUS

## 2015-01-06 MED ORDER — LABETALOL HCL 5 MG/ML IV SOLN
10.0000 mg | INTRAVENOUS | Status: DC | PRN
  Administered 2015-01-06: 40 mg via INTRAVENOUS
  Administered 2015-01-06: 20 mg via INTRAVENOUS
  Filled 2015-01-06: qty 8
  Filled 2015-01-06: qty 4

## 2015-01-06 MED ORDER — DOCUSATE SODIUM 100 MG PO CAPS
100.0000 mg | ORAL_CAPSULE | Freq: Two times a day (BID) | ORAL | Status: DC
  Administered 2015-01-06 – 2015-01-07 (×2): 100 mg via ORAL
  Filled 2015-01-06 (×3): qty 1

## 2015-01-06 MED ORDER — ONDANSETRON HCL 4 MG PO TABS: 4.0000 mg | ORAL_TABLET | ORAL | Status: DC | PRN

## 2015-01-06 MED ORDER — GLYCOPYRROLATE 0.2 MG/ML IJ SOLN
INTRAMUSCULAR | Status: DC | PRN
  Administered 2015-01-06: 0.6 mg via INTRAVENOUS

## 2015-01-06 MED ORDER — ARTIFICIAL TEARS OP OINT
TOPICAL_OINTMENT | OPHTHALMIC | Status: DC | PRN
  Administered 2015-01-06: 1 via OPHTHALMIC

## 2015-01-06 MED ORDER — ATORVASTATIN CALCIUM 10 MG PO TABS
10.0000 mg | ORAL_TABLET | Freq: Every day | ORAL | Status: DC
  Administered 2015-01-06: 10 mg via ORAL
  Filled 2015-01-06 (×2): qty 1

## 2015-01-06 MED ORDER — EPHEDRINE SULFATE 50 MG/ML IJ SOLN
INTRAMUSCULAR | Status: DC | PRN
  Administered 2015-01-06 (×2): 10 mg via INTRAVENOUS
  Administered 2015-01-06 (×3): 5 mg via INTRAVENOUS

## 2015-01-06 MED ORDER — LACTATED RINGERS IV SOLN
INTRAVENOUS | Status: DC | PRN
  Administered 2015-01-06: 08:00:00 via INTRAVENOUS

## 2015-01-06 MED ORDER — ONDANSETRON HCL 4 MG/2ML IJ SOLN: 4.0000 mg | INTRAMUSCULAR | Status: DC | PRN

## 2015-01-06 MED ORDER — ONDANSETRON HCL 4 MG/2ML IJ SOLN: 4.0000 mg | Freq: Once | INTRAMUSCULAR | Status: DC | PRN

## 2015-01-06 MED ORDER — HYDRALAZINE HCL 20 MG/ML IJ SOLN
10.0000 mg | INTRAMUSCULAR | Status: DC | PRN
  Administered 2015-01-06: 40 mg via INTRAVENOUS
  Administered 2015-01-07 (×2): 20 mg via INTRAVENOUS
  Filled 2015-01-06 (×4): qty 1

## 2015-01-06 MED ORDER — LIDOCAINE HCL (CARDIAC) 20 MG/ML IV SOLN
INTRAVENOUS | Status: DC | PRN
  Administered 2015-01-06: 100 mg via INTRAVENOUS

## 2015-01-06 MED ORDER — GLYCOPYRROLATE 0.2 MG/ML IJ SOLN
0.4000 mg | Freq: Once | INTRAMUSCULAR | Status: AC
Start: 2015-01-06 — End: 2015-01-06
  Administered 2015-01-06: 0.4 mg via INTRAVENOUS
  Filled 2015-01-06: qty 2

## 2015-01-06 MED ORDER — ASPIRIN EC 325 MG PO TBEC
325.0000 mg | DELAYED_RELEASE_TABLET | Freq: Every day | ORAL | Status: DC
  Administered 2015-01-07: 325 mg via ORAL
  Filled 2015-01-06: qty 1

## 2015-01-06 MED ORDER — FENTANYL CITRATE (PF) 100 MCG/2ML IJ SOLN
INTRAMUSCULAR | Status: DC | PRN
  Administered 2015-01-06: 25 ug via INTRAVENOUS
  Administered 2015-01-06: 50 ug via INTRAVENOUS
  Administered 2015-01-06: 100 ug via INTRAVENOUS
  Administered 2015-01-06: 25 ug via INTRAVENOUS

## 2015-01-06 MED ORDER — SUCCINYLCHOLINE CHLORIDE 20 MG/ML IJ SOLN
INTRAMUSCULAR | Status: DC | PRN
  Administered 2015-01-06: 70 mg via INTRAVENOUS

## 2015-01-06 MED ORDER — FUROSEMIDE 40 MG PO TABS
40.0000 mg | ORAL_TABLET | Freq: Every day | ORAL | Status: DC
  Administered 2015-01-07: 40 mg via ORAL
  Filled 2015-01-06: qty 1

## 2015-01-06 MED ORDER — LABETALOL HCL 5 MG/ML IV SOLN
INTRAVENOUS | Status: DC | PRN
  Administered 2015-01-06: 2.5 mg via INTRAVENOUS

## 2015-01-06 MED ORDER — HYDRALAZINE HCL 10 MG PO TABS
10.0000 mg | ORAL_TABLET | Freq: Three times a day (TID) | ORAL | Status: DC
  Administered 2015-01-06 – 2015-01-07 (×3): 10 mg via ORAL
  Filled 2015-01-06 (×5): qty 1

## 2015-01-06 MED ORDER — ONDANSETRON HCL 4 MG/2ML IJ SOLN
INTRAMUSCULAR | Status: DC | PRN
  Administered 2015-01-06: 4 mg via INTRAVENOUS

## 2015-01-06 MED ORDER — TAMSULOSIN HCL 0.4 MG PO CAPS
0.4000 mg | ORAL_CAPSULE | Freq: Two times a day (BID) | ORAL | Status: DC
  Administered 2015-01-06 – 2015-01-07 (×2): 0.4 mg via ORAL
  Filled 2015-01-06 (×3): qty 1

## 2015-01-06 MED ORDER — LIDOCAINE HCL 4 % MT SOLN
OROMUCOSAL | Status: DC | PRN
  Administered 2015-01-06: 4 mL via TOPICAL

## 2015-01-06 NOTE — Addendum Note (Signed)
Addendum  created 01/06/15 1239 by Suzy Bouchard, CRNA   Modules edited: Anesthesia Flowsheet

## 2015-01-06 NOTE — Progress Notes (Signed)
Pt seen in PACU. Awake, fluent. CN intact. Following commands, MAE well.

## 2015-01-06 NOTE — Transfer of Care (Signed)
Immediate Anesthesia Transfer of Care Note  Patient: Barry Haley  Procedure(s) Performed: Procedure(s): Pipeline Embolization (N/A)  Patient Location: PACU  Anesthesia Type:General  Level of Consciousness: awake and alert   Airway & Oxygen Therapy: Patient Spontanous Breathing and Patient connected to nasal cannula oxygen  Post-op Assessment: Report given to RN, Post -op Vital signs reviewed and stable and Patient moving all extremities  Post vital signs: Reviewed and stable  Last Vitals:  Filed Vitals:   01/06/15 0632  BP: 143/42  Pulse: 43  Temp: 36.2 C  Resp: 18    Complications: No apparent anesthesia complications.  Alert and oriented x3

## 2015-01-06 NOTE — Op Note (Signed)
PIPELINE EMBOLIZATION OF RIGHT INTERNAL CAROTID ARTERY ANEURYSM  OPERATOR:   Dr. Consuella Lose, MD  HISTORY:   The patient is a 69 y.o. yo male with a previously diagnosed right carotid aneurysm. He now presents for elective Pipeline embolization.  APPROACH:   The technical aspects of the procedure as well as its potential risks and benefits were reviewed with the patient. These risks included but were not limited bleeding, infection, allergic reaction, damage to organs/vital structures, stroke, non-diagnostic procedure, and the catastrophic outcomes of heart attack, coma, and death. With an understanding of these risks, informed consent was obtained and witnessed.    The patient was placed in the supine position on the angiography table and the skin of right groin prepped in the usual sterile fashion. The procedure was performed under general anesthesia.  An 8- [place] sheath was introduced in the right common femoral artery using Seldinger technique.  A fluorophase sequence was used to document the sheath position.    HEPARIN: 6000 Units total.   CONTRAST AGENT: 60cc, Omnipaque 300   FLUOROSCOPY TIME: 38.6 combined AP and lateral minutes    CATHETER(S) AND WIRE(S):    6-French Berenstein select catheter 088 90cm NeuronMax guide sheath   0.035" glidewire 180cm Catalyst 5 115cm guide catheter Via-27 154cm microcatheter 39mm x 64mm TransForm C balloon was introduced over the [employer] STD microwire  014 Transcend microwire  PIPELINE DEVICE (MRI COMPATIBLE): 4.33mm x 62mm PipelineFlex  VESSELS CATHETERIZED:   Right common carotid   Right internal carotid Right middle cerebral artery  Right common femoral  VESSELS STUDIED:   Right internal carotid (pre-embolization): Magnified obliques Right middle cerebral artery (microcatheter run): Magnified obliques Right internal carotid (immediate post-embolization): Magnified obliques Right internal carotid (post balloon angioplasty):  Magnified obliques Right internal carotid (control): AP and lateral Right femoral: RAO  PROCEDURAL NARRATIVE:   The select catheter was introduced over the guidewire through the guide sheath. This was advanced over the aortic arch, and the right common carotid artery was selected. The guide sheath was then advanced into the proximal right common carotid artery. Under roadmap guidance, the proximal right internal carotid artery was selected, and the sheath was advanced into the proximal right internal carotid. The select Catheter and guidewire were then removed, and the guide catheter was introduced. Again under roadmap guidance, the micro-catheter was navigated over the microwire into the supraclinoid internal carotid artery, and the guide catheter was advanced into the distal petrous internal carotid artery. The microcatheter was then advanced into the M2 segment. The microwire was removed, and microcatheter run was taken.  At this point, the above pipeline device was introduced into the microcatheter. The distal end of the device was then opened in the middle cerebral artery, and withdrawn back into the supraclinoid ICA. Deployment of the device was then performed across the aneurysm, with multiple angiograms and single shots taken to document stable device position and opening. After the device was deployed, Angie gram was taken. The microcatheter was then removed, and the above balloon catheter was prepped in standard fashion and advanced into the stent over initially the Synchro 2 microwire, and subsequently over the Transcend microwire.. The balloon was then inflated, and angiogram taken. The balloon catheter was then removed, the guide sheath was withdrawn into the cervical internal carotid artery, and final control Angela grams taken.  The guide catheter and guide sheath were then synchronously removed without incident.  INTERPRETATION:   Right internal carotid (pre-embolization) Injection  reveals the presence of a  widely patent ICA, M1, and A1 segments and their branches. The previously described medially projecting proximal supraclinoid internal carotid artery aneurysm is again seen. The parenchymal and venous phases are normal. The venous sinuses are widely patent.    Right middle cerebral artery (microcatheter run) Injection reveals normal flow through the distal middle cerebral artery territory, without any branch occlusions or contrast stasis seen. No contrast extravasation is seen.  Right internal carotid (immediate post-embolization) Injection reveals the presence of a widely patent internal carotid artery as well as middle cerebral and anterior cerebral arteries. The pipeline device is seen extending from the cavernous to supraclinoid internal carotid artery. There is mild contrast stasis within the aneurysm. The proximal portion of the stent appears slightly malopposed. No filling defects are seen.  Right internal carotid (post balloon angioplasty) Injection reveals the ICA, MCA, and ACA to be widely patent. There has been expansion of the proximal end of the pipeline device, and the proximal and distal ends of the device are now very well opposed to the ICA. There is a small segment of the device which is not completely opposed to the vessel wall, just adjacent to the aneurysm. No filling defects are seen.  Right internal carotid (control) Injection reveals a widely patent internal carotid artery leading to middle cerebral and anterior cerebral arteries and their distal branches which are patent. The pipeline device position is stable. No filling defects are seen. There is mild contrast stasis within the aneurysm. Parenchymal phase does not demonstrate any perfusion deficits. Venous phase is unremarkable.  Right femoral:    Normal vessel. No significant atherosclerotic disease. Arterial sheath in adequate position.   DISPOSITION:  Upon completion of the study, the femoral  sheath was removed and hemostasis obtained using a closure device. Good proximal and distal lower extremity pulses were documented upon achievement of hemostasis.    The procedure was well tolerated and no early complications were observed.       The patient was extubated and taken to the postanesthesia care unit for further observation and care.   IMPRESSION:  1. Successful pipeline embolization of a right internal carotid artery aneurysm without local thrombotic or distal embolic complication.  The preliminary results of this procedure were shared with the patient and the patient's family.

## 2015-01-06 NOTE — Anesthesia Postprocedure Evaluation (Signed)
  Anesthesia Post-op Note  Patient: Barry Haley  Procedure(s) Performed: Procedure(s): Pipeline Embolization (N/A)  Patient Location: PACU  Anesthesia Type:General  Level of Consciousness: awake, alert , oriented and patient cooperative  Airway and Oxygen Therapy: Patient Spontanous Breathing  Post-op Pain: mild  Post-op Assessment: Post-op Vital signs reviewed, Patient's Cardiovascular Status Stable, Respiratory Function Stable, Patent Airway, No signs of Nausea or vomiting and Pain level controlled  Post-op Vital Signs: stable  Last Vitals:  Filed Vitals:   01/06/15 1117  BP:   Pulse:   Temp: 36.6 C  Resp:     Complications: No apparent anesthesia complications

## 2015-01-06 NOTE — Anesthesia Preprocedure Evaluation (Addendum)
Anesthesia Evaluation   Patient awake    Reviewed: Allergy & Precautions, NPO status , Patient's Chart, lab work & pertinent test results  History of Anesthesia Complications (+) DIFFICULT AIRWAY  Airway Mallampati: II  TM Distance: >3 FB     Dental  (+) Dental Advisory Given   Pulmonary shortness of breath, sleep apnea , former smoker,    Pulmonary exam normal       Cardiovascular hypertension, + Peripheral Vascular Disease and +CHF + Valvular Problems/Murmurs Rhythm:Regular Rate:Normal     Neuro/Psych Anxiety CVA    GI/Hepatic hiatal hernia, GERD-  ,  Endo/Other  diabetes (took 8 units lantus (full dose) last night at 2130), Type 1  Renal/GU      Musculoskeletal   Abdominal   Peds  Hematology  (+) anemia ,   Anesthesia Other Findings   Reproductive/Obstetrics                           Anesthesia Physical Anesthesia Plan  ASA: III  Anesthesia Plan: General   Post-op Pain Management:    Induction: Intravenous  Airway Management Planned: Oral ETT  Additional Equipment: Arterial line  Intra-op Plan:   Post-operative Plan: Possible Post-op intubation/ventilation  Informed Consent: I have reviewed the patients History and Physical, chart, labs and discussed the procedure including the risks, benefits and alternatives for the proposed anesthesia with the patient or authorized representative who has indicated his/her understanding and acceptance.     Plan Discussed with: CRNA, Anesthesiologist and Surgeon  Anesthesia Plan Comments:         Anesthesia Quick Evaluation

## 2015-01-06 NOTE — H&P (Signed)
CC:  Aneurysm  HPI: Barry Haley is a 69 y.o. male initially presented to the outpatient neurosurgery clinic with a history of hemorrhagic stroke in 2011 at which time he underwent angiogram which demonstrated the presence of an anterior communicating artery aneurysm, as well as a larger right internal carotid artery aneurysm. More recently, he was admitted for possible stroke, although he was told that symptoms are activity related to uncontrolled blood sugar. During his workup, MRI and MRA revealed the presence of the above-described aneurysms, and he was subsequently referred for outpatient neurosurgical evaluation. He now presents for elective pipeline embolization. He is currently on aspirin 325 mg and Plavix 75 mg daily, with a dose this morning. He does not describe any issues with bloody noses, spontaneous gum bleeding, or spontaneous bruising.  PMH: Past Medical History  Diagnosis Date  . Hypertension   . Hyperlipidemia   . Cerebral aneurysm, nonruptured   . Epistaxis   . Anxiety state, unspecified   . Unspecified cerebral artery occlusion with cerebral infarction   . Other shock without mention of trauma   . Personal history of other disorder of urinary system   . Personal history of other diseases of digestive system   . Hx of migraines   . Meningitis 2010  . Sleep apnea     does not wear his cpap  . Diabetes     IDDm x 3 years  . Chronic post-traumatic stress disorder (PTSD)   . Cataracts, bilateral   . Shortness of breath dyspnea   . Anemia 2016    evaluated by Dr Hinton Rao @ [place] [facility]  . Anemia, iron deficiency   . H/O epistaxis   . Anterior cerebral aneurysm 2016  . Neuropathy   . Seborrheic dermatitis   . GERD (gastroesophageal reflux disease)   . History of hiatal hernia   . Benign prostatic hyperplasia (BPH) with urinary urgency   . Nocturia more than twice per night   . High cholesterol   . Difficult intubation 01/2013    /[facility] 01/27/13: anterior  cords; unsuccessful with MAC4 and MIL3, successful with #5 Glidescope  . CKD (chronic kidney disease)     Dr. Justin Mend, [facility]    PSH: Past Surgical History  Procedure Laterality Date  . Appendectomy      child  . 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 Mitral valve reconstruction with cardiopulmonary bypass  01/27/2013  . Tee without cardioversion N/A 04/22/2014    Procedure: TRANSESOPHAGEAL ECHOCARDIOGRAM (TEE);  Surgeon: Thayer Headings, MD;  Location: [place];  Service: Cardiovascular;  Laterality: N/A;  . Radiology with anesthesia N/A 04/23/2014    Procedure: RADIOLOGY WITH ANESTHESIA;  Surgeon: Medication Radiologist, MD;  Location: [place];  Service: Radiology;  Laterality: N/A;    SH: History  Substance Use Topics  . Smoking status: Former Smoker -- 2.50 packs/day for 40 years    Types: Cigarettes    Quit date: 10/04/2008  . Smokeless tobacco: Never Used  . Alcohol Use: No     Comment: quit: 10/04/2008- rarely now    MEDS: Prior to Admission medications   Medication Sig Start Date End Date Taking? Authorizing Provider  amLODipine (NORVASC) 10 MG tablet Take 1 tablet (10 mg total) by mouth daily. 06/30/14  Yes Velvet Bathe, MD  aspirin EC 325 MG tablet Take 1 tablet (325 mg total) by mouth daily. 09/27/14  Yes Rosalin Hawking, MD  atorvastatin (LIPITOR) 10 MG tablet Take 10 mg by mouth  at bedtime.   Yes Historical Provider, MD  citalopram (CELEXA) 20 MG tablet Take 20 mg by mouth daily. 04/27/14  Yes Allie Bossier, MD  clopidogrel (PLAVIX) 75 MG tablet Take 75 mg by mouth daily.   Yes Historical Provider, MD  ergocalciferol (VITAMIN D2) 50000 UNITS capsule Take 50,000 Units by mouth every 30 (thirty) days. No specific day   Yes Historical Provider, MD  esomeprazole (NEXIUM) 40 MG capsule Take 40 mg by mouth daily.    Yes Historical Provider, MD  Fe Fum-FA-B Cmp-C-Zn-Mg-Mn-Cu (HEMOCYTE PLUS PO) Take 1 tablet by mouth daily.   Yes Historical  Provider, MD  furosemide (LASIX) 40 MG tablet Take 40 mg by mouth daily.   Yes Historical Provider, MD  glimepiride (AMARYL) 2 MG tablet Take 2 mg by mouth daily with breakfast.   Yes Historical Provider, MD  hydrALAZINE (APRESOLINE) 10 MG tablet Take 10 mg by mouth 3 (three) times daily.    Yes Historical Provider, MD  Insulin Glargine (LANTUS SOLOSTAR) 100 UNIT/ML Solostar Pen Inject 8 Units into the skin daily at 10 pm. 04/27/14  Yes Allie Bossier, MD  Insulin Pen Needle (AURORA PEN NEEDLES) 29G X 12MM MISC 8 Units by Does not apply route daily. 04/27/14  Yes Allie Bossier, MD  metoprolol succinate (TOPROL-XL) 50 MG 24 hr tablet Take 25 mg by mouth 2 (two) times daily. Take with or immediately following a meal.   Yes Historical Provider, MD  Tamsulosin HCl (FLOMAX) 0.4 MG CAPS Take 0.4 mg by mouth 2 (two) times daily.    Yes Historical Provider, MD  vitamin B-12 (CYANOCOBALAMIN) 1000 MCG tablet Take 1,000 mcg by mouth daily.     Yes Historical Provider, MD    ALLERGY: Allergies  Allergen Reactions  . Hctz [Hydrochlorothiazide] Other (See Comments)    Renal insufficiency  . Niaspan [Niacin Er] Itching  . Zolpidem Anxiety and Other (See Comments)    Bad dreams    ROS: ROS  NEUROLOGIC EXAM: Awake, alert, oriented Memory and concentration grossly intact Speech fluent, appropriate CN grossly intact Motor exam: Upper Extremities Deltoid Bicep Tricep Grip  Right 5/5 5/5 5/5 5/5  Left 5/5 5/5 5/5 5/5   Lower Extremity IP Quad PF DF EHL  Right 5/5 5/5 5/5 5/5 5/5  Left 5/5 5/5 5/5 5/5 5/5   Sensation grossly intact to LT  [facility]: Diagnostic cerebral angiogram demonstrates an approximate 5 mm supraclinoid right internal carotid artery aneurysm. There is a small superiorly projecting a proximally 3 mm anterior communicating artery aneurysm.  IMPRESSION: - 69 y.o. male with a > 5 mm right internal carotid artery aneurysm which warrants treatment, amenable to pipeline  embolization.  PLAN: - Proceed with endovascular pipeline embolization of the right ICA aneurysm - ICU postop  I have reviewed the angiogram, as well as the details of the procedure, its risks, benefits, and alternatives with the patient and his family. All questions were answered, and informed consent was obtained.

## 2015-01-06 NOTE — Progress Notes (Signed)
UR completed.  Await recovery from procedure to determine if pt will have d/c needs.   Sandi Mariscal, RN BSN [place] CCM Trauma/Neuro ICU Case Manager [phone]

## 2015-01-06 NOTE — Anesthesia Procedure Notes (Signed)
Procedure Name: Intubation Date/Time: 01/06/2015 8:18 AM Performed by: Suzy Bouchard Pre-anesthesia Checklist: Patient identified, Emergency Drugs available, Suction available, Patient being monitored and Timeout performed Patient Re-evaluated:Patient Re-evaluated prior to inductionOxygen Delivery Method: Circle system utilized Preoxygenation: Pre-oxygenation with 100% oxygen Intubation Type: IV induction Ventilation: Mask ventilation without difficulty Laryngoscope Size: Miller and 2 Grade View: Grade II Tube type: Oral Tube size: 7.5 mm Number of attempts: 1 Airway Equipment and Method: Stylet and LTA kit utilized Placement Confirmation: ETT inserted through vocal cords under direct vision,  positive ETCO2 and breath sounds checked- equal and bilateral Secured at: 22 cm Tube secured with: Tape Dental Injury: Teeth and Oropharynx as per pre-operative assessment  Comments: Stiff epiglottis.  Able to have grade 2 view with Sabra Heck 2 and external pressure.  Easy mask.

## 2015-01-07 LAB — GLUCOSE, CAPILLARY: Glucose-Capillary: 153 mg/dL — ABNORMAL HIGH (ref 70–99)

## 2015-01-07 MED ORDER — RANITIDINE HCL 150 MG PO TABS: 150.0000 mg | ORAL_TABLET | Freq: Two times a day (BID) | ORAL | Status: DC

## 2015-01-07 NOTE — Discharge Summary (Signed)
Physician Discharge Summary  Patient ID: [name] MRN: [id number] DOB/AGE: 1946/05/27 69 y.o.  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 Barry Haley is a 69 y.o. male electively admitted after undergoing pipeline embolization of a right internal carotid artery aneurysm. Postoperative leak, the patient was at her neurologic baseline. He was observed in the intensive care unit overnight, was tolerating diet, voiding without difficulty, and a relating without assistance. He had minimal headache.  Treatments: Surgery - pipeline embolization of right internal carotid artery aneurysm  Discharge Exam: Blood pressure 142/54, pulse 67, temperature 98.4 F (36.9 C), temperature source Oral, resp. rate 19, height 5\' 10"  (1.778 m), weight 108.9 kg (240 lb 1.3 oz), SpO2 95 %. Awake, alert, oriented Speech fluent, appropriate CN grossly intact 5/5 BUE/BLE Wound c/d/i  Disposition: 01-Home or Self Care     Medication List    STOP taking these medications        NEXIUM 40 MG capsule  Generic drug:  esomeprazole      TAKE these medications        amLODipine 10 MG tablet  Commonly known as:  NORVASC  Take 1 tablet (10 mg total) by mouth daily.     aspirin EC 325 MG tablet  Take 1 tablet (325 mg total) by mouth daily.     atorvastatin 10 MG tablet  Commonly known as:  LIPITOR  Take 10 mg by mouth at bedtime.     citalopram 20 MG tablet  Commonly known as:  CELEXA  Take 20 mg by mouth daily.     clopidogrel 75 MG tablet  Commonly known as:  PLAVIX  Take 75 mg by mouth daily.     ergocalciferol 50000 UNITS capsule  Commonly known as:  VITAMIN D2  Take 50,000 Units by mouth every 30 (thirty) days. No specific day     furosemide 40 MG tablet  Commonly known as:  LASIX  Take 40 mg  by mouth daily.     glimepiride 2 MG tablet  Commonly known as:  AMARYL  Take 2 mg by mouth daily with breakfast.     HEMOCYTE PLUS PO  Take 1 tablet by mouth daily.     hydrALAZINE 10 MG tablet  Commonly known as:  APRESOLINE  Take 10 mg by mouth 3 (three) times daily.     Insulin Glargine 100 UNIT/ML Solostar Pen  Commonly known as:  LANTUS SOLOSTAR  Inject 8 Units into the skin daily at 10 pm.     Insulin Pen Needle 29G X 12MM Misc  Commonly known as:  AURORA PEN NEEDLES  8 Units by Does not apply route daily.     metoprolol succinate 50 MG 24 hr tablet  Commonly known as:  TOPROL-XL  Take 25 mg by mouth 2 (two) times daily. Take with or immediately following a meal.     ranitidine 150 MG tablet  Commonly known as:  ZANTAC  Take 1 tablet (150 mg total) by mouth 2 (two) times daily.     tamsulosin 0.4 MG Caps capsule  Commonly known as:  FLOMAX  Take 0.4 mg by mouth 2 (two) times daily.     vitamin B-12 1000 MCG tablet  Commonly known as:  CYANOCOBALAMIN  Take 1,000 mcg by mouth daily.           Follow-up  Information    Follow up with Geoffry Bannister, C, MD In 1 month.   Specialty:  Neurosurgery   Contact information:   [address] [place] [address] [place] [address] [phone]       Signed: Consuella Lose, Loletha Grayer 01/07/2015, 9:47 AM

## 2015-01-07 NOTE — Plan of Care (Signed)
Problem: Phase II Progression Outcomes Goal: Ambulates up to 600 ft. in hall x 1 Outcome: Completed/Met Date Met:  01/07/15 Ambulated 2 times around unit with no complications.

## 2015-01-07 NOTE — Plan of Care (Signed)
Problem: Phase I Progression Outcomes Goal: Voiding-avoid urinary catheter unless indicated Outcome: Completed/Met Date Met:  01/07/15 Foley D/C 01/07/15 0430

## 2015-01-10 ENCOUNTER — Encounter (HOSPITAL_COMMUNITY): Payer: Self-pay | Admitting: Neurosurgery

## 2015-01-17 DIAGNOSIS — E1165 Type 2 diabetes mellitus with hyperglycemia: Secondary | ICD-10-CM | POA: Diagnosis not present

## 2015-01-17 DIAGNOSIS — K219 Gastro-esophageal reflux disease without esophagitis: Secondary | ICD-10-CM | POA: Diagnosis not present

## 2015-01-27 DIAGNOSIS — Z6833 Body mass index (BMI) 33.0-33.9, adult: Secondary | ICD-10-CM | POA: Diagnosis not present

## 2015-01-27 DIAGNOSIS — I671 Cerebral aneurysm, nonruptured: Secondary | ICD-10-CM | POA: Diagnosis not present

## 2015-02-01 ENCOUNTER — Encounter: Payer: Self-pay | Admitting: Neurology

## 2015-02-01 ENCOUNTER — Ambulatory Visit (INDEPENDENT_AMBULATORY_CARE_PROVIDER_SITE_OTHER): Payer: Medicare Other | Admitting: Neurology

## 2015-02-01 VITALS — BP 136/65 | HR 57 | Wt 232.2 lb

## 2015-02-01 DIAGNOSIS — N189 Chronic kidney disease, unspecified: Secondary | ICD-10-CM

## 2015-02-01 DIAGNOSIS — E1122 Type 2 diabetes mellitus with diabetic chronic kidney disease: Secondary | ICD-10-CM | POA: Diagnosis not present

## 2015-02-01 DIAGNOSIS — I671 Cerebral aneurysm, nonruptured: Secondary | ICD-10-CM | POA: Diagnosis not present

## 2015-02-01 DIAGNOSIS — E785 Hyperlipidemia, unspecified: Secondary | ICD-10-CM | POA: Diagnosis not present

## 2015-02-01 DIAGNOSIS — I6340 Cerebral infarction due to embolism of unspecified cerebral artery: Secondary | ICD-10-CM

## 2015-02-01 DIAGNOSIS — I10 Essential (primary) hypertension: Secondary | ICD-10-CM

## 2015-02-01 NOTE — Progress Notes (Signed)
STROKE NEUROLOGY FOLLOW UP NOTE  NAME: Barry Haley DOB: [birth year]  REASON FOR VISIT: stroke follow up HISTORY FROM: chart  Today we had the pleasure of seeing Barry Haley in follow-up at our Neurology Clinic. Pt was accompanied by wife.   History Summary For detailed info please refer to my note on 04/16/14.  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 In 2014, he again developed sepsis with bacteremia and TEE showed endocarditis. He had MV and TV repair in [facility]. Recently developed CHF and followed up with Dr. Bridgett Larsson in [facility] cardiology. He started to have left hemianopia and left visual disturbance mid August and concerns for a new stroke. During last clinic visit, I ordered MRI and TEE and EEG. However, before these being done, he was admitted to [facility] due to worsening generalized weakness and confusion. Found to have DM and DKA and A1C 13.7 as well as AKI. He was started on insulin drip and then subq. His MRI done should no acute stroke and TEE showed PFO and EF 35-40% without vegetations, EEG negative for seizure. His visual disturbance was considered as recrudescence of previous right occipital hemorrhagic infarct in 2011. He was continued on ASA and sent to CIR.   05/27/14 follow up - the patient has been doing well. He has OT to follow twice a week. His visual disturbance is gone. Blood sugar at home 110-120s. BP stable today 125/54. He is on insulin subq for DM control. He has PCP to see on 10/7 and cardiologist to f/u on 06/10/14. 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   09/27/14 follow up - pt was doing OK with neurology symptoms. However, he was admitted in 06/2014 for CHF exacerbation. He was resumed on lasix and much improved. He currently following up with his cardiology in [place]. His ASA was increased to 325mg .  His BP today in clinic was 140/49. Otherwise, he does not have neuro complains. Dr. Cleotilde Neer office has contacted him for appointment but he hold it off due to CHF exacerbation at that time.   Interval History During the interval time, pt has been doing well. 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 Pt tolerate well and procedure successful. Currently, he is on ASA and plavix. And Plan is to continue plavix for 6 months and then repeat cerebral angio. Pt BP today 136/65 and he stated that his glucose up and down at home but recent A1C was 7.6, significantly improved from before. No other complains.   REVIEW OF SYSTEMS: Full 14 system review of systems performed and notable only for those listed below and in HPI above, all others are negative:  Constitutional: N/A  Cardiovascular: murmur  Ear/Nose/Throat: N/A  Skin: wound, itching  Eyes: N/A  Respiratory: SOB, chest tightness  Gastroitestinal: N/A  Genitourinary: N/A Hematology/Lymphatic: N/A  Endocrine: N/A  Musculoskeletal: N/A  Allergy/Immunology: allergy  Neurological: HA Psychiatric: agitation  The following represents the patient's updated allergies and side effects list: Allergies  Allergen Reactions  . Hctz [Hydrochlorothiazide] Other (See Comments)    Renal insufficiency  . Niaspan [Niacin Er] Itching  . Zolpidem Anxiety and Other (See Comments)    Bad dreams    Labs since last visit of relevance include the following: Results for orders placed or performed during the hospital encounter of  01/06/15  Brain natriuretic peptide  Result Value Ref Range   B Natriuretic Peptide 301.2 (H) 0.0 - 100.0 pg/mL  Glucose, capillary  Result Value Ref Range   Glucose-Capillary 148 (H) 70 - 99 mg/dL  CBC  Result Value Ref Range   WBC 7.5 4.0 - 10.5 K/uL   RBC 3.97 (L) 4.22 - 5.81 MIL/uL   Hemoglobin 9.3 (L) 13.0 - 17.0 g/dL   HCT 29.9 (L) 39.0 - 52.0 %   MCV 75.3 (L)  78.0 - 100.0 fL   MCH 23.4 (L) 26.0 - 34.0 pg   MCHC 31.1 30.0 - 36.0 g/dL   RDW 21.1 (H) 11.5 - 15.5 %   Platelets 125 (L) 150 - 400 K/uL  Creatinine, serum  Result Value Ref Range   Creatinine, Ser 1.83 (H) 0.61 - 1.24 mg/dL   GFR calc non Af Amer 36 (L) >60 mL/min   GFR calc Af Amer 42 (L) >60 mL/min  Glucose, capillary  Result Value Ref Range   Glucose-Capillary 150 (H) 70 - 99 mg/dL   Comment 1 Notify RN    Comment 2 Document in Chart   Glucose, capillary  Result Value Ref Range   Glucose-Capillary 81 70 - 99 mg/dL  Glucose, capillary  Result Value Ref Range   Glucose-Capillary 98 70 - 99 mg/dL  Glucose, capillary  Result Value Ref Range   Glucose-Capillary 153 (H) 70 - 99 mg/dL   Comment 1 Notify RN    Comment 2 Document in Chart     The neurologically relevant items on the patient's problem list were reviewed on today's visit.  Neurologic Examination  A problem focused neurological exam (12 or more points of the single system neurologic examination, vital signs counts as 1 point, cranial nerves count for 8 points) was performed.  Blood pressure 136/65, pulse 57, weight 232 lb 3.2 oz (105.325 kg).  General - Well nourished, well developed, in no apparent distress.  Ophthalmologic - Sharp disc margins OU.  Cardiovascular - Regular rate and rhythm with no murmur.  Mental Status -  Level of arousal and orientation to time, place, and person were intact. Language including expression, naming, repetition, comprehension was assessed and found intact. Attention span and concentration were impaired. Fund of Knowledge was impaired, not knowing previous presidents.  Cranial Nerves II - XII - II - Visual field intact OU. III, IV, VI - Extraocular movements intact. V - Facial sensation intact bilaterally. VII - Facial movement intact bilaterally. VIII - Hearing & vestibular intact bilaterally. X - Palate elevates symmetrically. XI - Chin turning & shoulder shrug  intact bilaterally. XII - Tongue protrusion intact.  Motor Strength - The patient's strength was normal in all extremities and pronator drift was absent.  Bulk was normal and fasciculations were absent.   Motor Tone - Muscle tone was assessed at the neck and appendages and was normal.  Reflexes - The patient's reflexes were normal in all extremities and he had no pathological reflexes.  Sensory - Light touch, temperature/pinprick were assessed and were normal.    Coordination - The patient had normal movements in the hands and feet with no ataxia or dysmetria.  Tremor was absent.  Gait and Station - large based gait but steady.  Data reviewed: I personally reviewed the images and agree with the radiology interpretations.  Ct Head Wo Contrast  04/21/2014 IMPRESSION: 1. No acute intracranial abnormality. 2. Atrophy and chronic microvascular white matter ischemic changes.   CTA head and neck 11/13/10  1. Stable medial right cavernous carotid aneurysm 5x5x18mm 2. Stable anterior communicating artery aneurysm 18mm. 3. No acute or focal abnormality otherwise. 4. Remote encephalomalacia of the right occipital lobe. 5. Areas of remote encephalomalacia and calcification in the posterior left frontal lobe.  CUS 01/2013  This is an abnormal carotid duplex ultrasound with color flow imaging. .  There is 50-75% stenosis of the left ICA by Doppler criteria, although this  is likely at the low end of the range. No severe luminal narrowing noted,  although there is moderate plaque. The Vertebrals demonstrate foward flow  bilaterally . Insonation at multiple depths was attempted of the  [facility] and [facility]hthalm[facility]. This is an abnormal Transcranial  Doppler Ultrasound examination. The right ACA velocity is slightly elevated,  but does not meet criteria for stenosis and most likely reflects hyperemia or  collateral flow. The resistance is increased in multiple vessels, which may    point to diffuse atherosclerosis or distal small vessel disease. However,  there is no evidence of stenosis, vasospasm, or flow diversion. There was no  previous study available for direct comparison.   CUS 04/2014 - 1-39% internal carotid artery stenosis bilaterally. Vertebral arteries are patent with antegrade flow.   TEE - Impression: No vegetations seen EF 35-40%  + PFO ( markedly positive)  He is bradycardic and LV function is moderately reduced.  I have DCd his Diltiazem for now..  Transthoracic echo to be done.   TTE - Left ventricle: Systolic function was moderately to severely reduced. The estimated ejection fraction was in the range of 30% to 35%. - Aortic valve: No evidence of vegetation. - Mitral valve: No evidence of vegetation. - Tricuspid valve: No evidence of vegetation.   EEG - Summary This is a severely abnormal EEG due to the presence of severe background slowing indicative of bihemispheric dysfunction which is a nonspecific finding seen in a variety of degenerative, toxic, metabolic and ischemic etiologies. No definite epileptiform activity is noted.   MRI head and MRA neck and head 04/23/14 Negative for acute infarct Numerous areas of chronic hemorrhage in the brain. Question  hypertension versus cerebral amyloid.  Diffuse intracranial atherosclerotic disease without large vessel occlusion.  2 mm aneurysm anterior communicating artery.  Question severe stenosis proximal right common carotid artery versus  artifact. Favor artifact. Both carotid bifurcations are patent  bilaterally  Atherosclerotic disease in the proximal vertebral arteries. Focal  area of signal loss in the proximal right vertebral artery may be  due to severe stenosis or artifact.   Component     Latest Ref Rng 04/16/2014 04/24/2014  Cholesterol, Total     100 - 199 mg/dL 142   Triglycerides     0 - 149 mg/dL 196 (H)   HDL     >39 mg/dL 25 (L)   VLDL Cholesterol Cal     5 - 40 mg/dL  39   LDL (calc)     0 - 99 mg/dL 78   Total CHOL/HDL Ratio     0.0 - 5.0 ratio units 5.7 (H)   TSH     0.450 - 4.500 uIU/mL 1.400   Free T4     0.82 - 1.77 ng/dL 1.56   Hemoglobin A1C     4.8 - 5.6 % 13.7 (H)   Estimated average glucose      346   Vitamin B-12     211 - 911 pg/mL  1640 (H)    Assessment: As you may recall, he is  a 69 y.o. 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 MRI negative for acute stroke, TEE no vegetation and EEG no seizure. His neuro presentation was thought to be recrudescence of his old right occipital stroke. He recovered well after glucose under control. On follow up, his neuro stable and no recurrent neuro symptoms. He has CHF and following with local cardiologist. He had pipeline stent for his right cavernous ICA aneurysm in 01/2015, currently on ASA and plavix.  Plan:  - continue ASA and plavix and lipitor for stroke prevention. Will transition to plavix monotherapy in 6 months. - follow up with Dr. Kathyrn Sheriff for aneurysm follow up.  - continue to follow up with cardiology for CHF and HTN - follow up in DM education clinic - Follow up with your primary care physician for stroke risk factor modification. Recommend maintain blood pressure goal <130/80, diabetes with hemoglobin A1c goal below 6.5% and lipids with LDL cholesterol goal below 70 mg/dL.  - check BP and glucose at home - RTC in 6 months  No orders of the defined types were placed in this encounter.    No orders of the defined types were placed in this encounter.    Patient Instructions  - continue ASA and plavix and lipitor for stroke prevention.  - follow up with Dr. Kathyrn Sheriff for aneurysm follow up.  - continue to follow up with cardiology for CHF and  HTN - follow up in DM education clinic - Follow up with your primary care physician for stroke risk factor modification. Recommend maintain blood pressure goal <130/80, diabetes with hemoglobin A1c goal below 6.5% and lipids with LDL cholesterol goal below 70 mg/dL.  - check BP and glucose at home - healthy and low salt diet - follow up in 6 months.    Rosalin Hawking, MD PhD [facility] [address] [place], [place] [address] [phone]

## 2015-02-01 NOTE — Patient Instructions (Signed)
-   continue ASA and plavix and lipitor for stroke prevention.  - follow up with Dr. Kathyrn Sheriff for aneurysm follow up.  - continue to follow up with cardiology for CHF and HTN - follow up in DM education clinic - Follow up with your primary care physician for stroke risk factor modification. Recommend maintain blood pressure goal <130/80, diabetes with hemoglobin A1c goal below 6.5% and lipids with LDL cholesterol goal below 70 mg/dL.  - check BP and glucose at home - healthy and low salt diet - follow up in 6 months.

## 2015-03-11 DIAGNOSIS — E1165 Type 2 diabetes mellitus with hyperglycemia: Secondary | ICD-10-CM | POA: Diagnosis not present

## 2015-03-18 DIAGNOSIS — E1143 Type 2 diabetes mellitus with diabetic autonomic (poly)neuropathy: Secondary | ICD-10-CM | POA: Diagnosis not present

## 2015-03-18 DIAGNOSIS — Z0000 Encounter for general adult medical examination without abnormal findings: Secondary | ICD-10-CM | POA: Diagnosis not present

## 2015-03-21 DIAGNOSIS — D649 Anemia, unspecified: Secondary | ICD-10-CM | POA: Diagnosis not present

## 2015-03-21 DIAGNOSIS — E780 Pure hypercholesterolemia: Secondary | ICD-10-CM | POA: Diagnosis not present

## 2015-03-21 DIAGNOSIS — E119 Type 2 diabetes mellitus without complications: Secondary | ICD-10-CM | POA: Diagnosis not present

## 2015-03-21 DIAGNOSIS — R609 Edema, unspecified: Secondary | ICD-10-CM | POA: Diagnosis not present

## 2015-03-23 DIAGNOSIS — D509 Iron deficiency anemia, unspecified: Secondary | ICD-10-CM | POA: Diagnosis not present

## 2015-04-11 DIAGNOSIS — N401 Enlarged prostate with lower urinary tract symptoms: Secondary | ICD-10-CM | POA: Diagnosis not present

## 2015-04-11 DIAGNOSIS — R972 Elevated prostate specific antigen [PSA]: Secondary | ICD-10-CM | POA: Diagnosis not present

## 2015-04-15 DIAGNOSIS — Z9889 Other specified postprocedural states: Secondary | ICD-10-CM | POA: Insufficient documentation

## 2015-04-15 DIAGNOSIS — E785 Hyperlipidemia, unspecified: Secondary | ICD-10-CM | POA: Diagnosis not present

## 2015-04-15 DIAGNOSIS — I10 Essential (primary) hypertension: Secondary | ICD-10-CM | POA: Diagnosis not present

## 2015-04-15 DIAGNOSIS — Z87448 Personal history of other diseases of urinary system: Secondary | ICD-10-CM | POA: Insufficient documentation

## 2015-04-15 DIAGNOSIS — E118 Type 2 diabetes mellitus with unspecified complications: Secondary | ICD-10-CM | POA: Diagnosis not present

## 2015-04-15 DIAGNOSIS — Z8679 Personal history of other diseases of the circulatory system: Secondary | ICD-10-CM

## 2015-04-15 HISTORY — DX: Other specified postprocedural states: Z98.890

## 2015-04-15 HISTORY — DX: Personal history of other diseases of the circulatory system: Z86.79

## 2015-04-15 HISTORY — DX: Personal history of other diseases of urinary system: Z87.448

## 2015-04-21 DIAGNOSIS — E780 Pure hypercholesterolemia: Secondary | ICD-10-CM | POA: Diagnosis not present

## 2015-04-21 DIAGNOSIS — E119 Type 2 diabetes mellitus without complications: Secondary | ICD-10-CM | POA: Diagnosis not present

## 2015-04-21 DIAGNOSIS — I509 Heart failure, unspecified: Secondary | ICD-10-CM | POA: Diagnosis not present

## 2015-04-21 DIAGNOSIS — I10 Essential (primary) hypertension: Secondary | ICD-10-CM | POA: Diagnosis not present

## 2015-05-06 ENCOUNTER — Other Ambulatory Visit: Payer: Self-pay | Admitting: Neurosurgery

## 2015-05-06 ENCOUNTER — Other Ambulatory Visit (HOSPITAL_COMMUNITY): Payer: Self-pay | Admitting: Neurosurgery

## 2015-05-06 DIAGNOSIS — I729 Aneurysm of unspecified site: Secondary | ICD-10-CM

## 2015-06-08 DIAGNOSIS — D631 Anemia in chronic kidney disease: Secondary | ICD-10-CM | POA: Diagnosis not present

## 2015-06-08 DIAGNOSIS — N2581 Secondary hyperparathyroidism of renal origin: Secondary | ICD-10-CM | POA: Diagnosis not present

## 2015-06-08 DIAGNOSIS — N189 Chronic kidney disease, unspecified: Secondary | ICD-10-CM | POA: Diagnosis not present

## 2015-06-08 DIAGNOSIS — N183 Chronic kidney disease, stage 3 (moderate): Secondary | ICD-10-CM | POA: Diagnosis not present

## 2015-06-15 DIAGNOSIS — R5383 Other fatigue: Secondary | ICD-10-CM | POA: Diagnosis not present

## 2015-06-15 DIAGNOSIS — D539 Nutritional anemia, unspecified: Secondary | ICD-10-CM | POA: Diagnosis not present

## 2015-06-15 DIAGNOSIS — J984 Other disorders of lung: Secondary | ICD-10-CM | POA: Diagnosis not present

## 2015-06-15 DIAGNOSIS — D509 Iron deficiency anemia, unspecified: Secondary | ICD-10-CM | POA: Diagnosis not present

## 2015-06-15 DIAGNOSIS — Z23 Encounter for immunization: Secondary | ICD-10-CM | POA: Diagnosis not present

## 2015-06-15 DIAGNOSIS — E1143 Type 2 diabetes mellitus with diabetic autonomic (poly)neuropathy: Secondary | ICD-10-CM | POA: Diagnosis not present

## 2015-06-21 DIAGNOSIS — L0390 Cellulitis, unspecified: Secondary | ICD-10-CM | POA: Diagnosis not present

## 2015-06-28 ENCOUNTER — Other Ambulatory Visit (HOSPITAL_COMMUNITY): Payer: Self-pay | Admitting: Neurosurgery

## 2015-06-28 ENCOUNTER — Ambulatory Visit (HOSPITAL_COMMUNITY)
Admission: RE | Admit: 2015-06-28 | Discharge: 2015-06-28 | Disposition: A | Discharge status: Home / Self Care | Payer: Medicare Other | Source: Ambulatory Visit | Attending: Neurosurgery | Admitting: Neurosurgery

## 2015-06-28 DIAGNOSIS — N189 Chronic kidney disease, unspecified: Secondary | ICD-10-CM | POA: Diagnosis not present

## 2015-06-28 DIAGNOSIS — Z87891 Personal history of nicotine dependence: Secondary | ICD-10-CM | POA: Insufficient documentation

## 2015-06-28 DIAGNOSIS — Z48813 Encounter for surgical aftercare following surgery on the respiratory system: Secondary | ICD-10-CM | POA: Diagnosis present

## 2015-06-28 DIAGNOSIS — Z7902 Long term (current) use of antithrombotics/antiplatelets: Secondary | ICD-10-CM | POA: Insufficient documentation

## 2015-06-28 DIAGNOSIS — E1140 Type 2 diabetes mellitus with diabetic neuropathy, unspecified: Secondary | ICD-10-CM | POA: Diagnosis not present

## 2015-06-28 DIAGNOSIS — Z7982 Long term (current) use of aspirin: Secondary | ICD-10-CM | POA: Insufficient documentation

## 2015-06-28 DIAGNOSIS — I729 Aneurysm of unspecified site: Secondary | ICD-10-CM

## 2015-06-28 DIAGNOSIS — K219 Gastro-esophageal reflux disease without esophagitis: Secondary | ICD-10-CM | POA: Insufficient documentation

## 2015-06-28 DIAGNOSIS — E7800 Pure hypercholesterolemia, unspecified: Secondary | ICD-10-CM | POA: Insufficient documentation

## 2015-06-28 DIAGNOSIS — Z8673 Personal history of transient ischemic attack (TIA), and cerebral infarction without residual deficits: Secondary | ICD-10-CM | POA: Diagnosis not present

## 2015-06-28 DIAGNOSIS — G4730 Sleep apnea, unspecified: Secondary | ICD-10-CM | POA: Diagnosis not present

## 2015-06-28 DIAGNOSIS — I671 Cerebral aneurysm, nonruptured: Secondary | ICD-10-CM | POA: Insufficient documentation

## 2015-06-28 DIAGNOSIS — E1122 Type 2 diabetes mellitus with diabetic chronic kidney disease: Secondary | ICD-10-CM | POA: Insufficient documentation

## 2015-06-28 DIAGNOSIS — N401 Enlarged prostate with lower urinary tract symptoms: Secondary | ICD-10-CM | POA: Insufficient documentation

## 2015-06-28 DIAGNOSIS — Z794 Long term (current) use of insulin: Secondary | ICD-10-CM | POA: Insufficient documentation

## 2015-06-28 DIAGNOSIS — F4312 Post-traumatic stress disorder, chronic: Secondary | ICD-10-CM | POA: Insufficient documentation

## 2015-06-28 DIAGNOSIS — I129 Hypertensive chronic kidney disease with stage 1 through stage 4 chronic kidney disease, or unspecified chronic kidney disease: Secondary | ICD-10-CM | POA: Diagnosis not present

## 2015-06-28 DIAGNOSIS — R3915 Urgency of urination: Secondary | ICD-10-CM | POA: Insufficient documentation

## 2015-06-28 DIAGNOSIS — Z48812 Encounter for surgical aftercare following surgery on the circulatory system: Secondary | ICD-10-CM | POA: Insufficient documentation

## 2015-06-28 LAB — URINALYSIS, ROUTINE W REFLEX MICROSCOPIC
Bilirubin Urine: NEGATIVE
GLUCOSE, UA: NEGATIVE mg/dL
Hgb urine dipstick: NEGATIVE
KETONES UR: NEGATIVE mg/dL
LEUKOCYTES UA: NEGATIVE
Nitrite: NEGATIVE
PH: 5 (ref 5.0–8.0)
Protein, ur: NEGATIVE mg/dL
Specific Gravity, Urine: 1.011 (ref 1.005–1.030)
Urobilinogen, UA: 0.2 mg/dL (ref 0.0–1.0)

## 2015-06-28 LAB — BASIC METABOLIC PANEL
Anion gap: 6 (ref 5–15)
BUN: 27 mg/dL — AB (ref 6–20)
CALCIUM: 9 mg/dL (ref 8.9–10.3)
CO2: 25 mmol/L (ref 22–32)
CREATININE: 1.51 mg/dL — AB (ref 0.61–1.24)
Chloride: 108 mmol/L (ref 101–111)
GFR calc Af Amer: 53 mL/min — ABNORMAL LOW (ref >60)
GFR calc non Af Amer: 45 mL/min — ABNORMAL LOW (ref >60)
GLUCOSE: 142 mg/dL — AB (ref 65–99)
Potassium: 4.3 mmol/L (ref 3.5–5.1)
Sodium: 139 mmol/L (ref 135–145)

## 2015-06-28 LAB — CBC WITH DIFFERENTIAL/PLATELET
BASOS PCT: 0 %
Basophils Absolute: 0 10*3/uL (ref 0.0–0.1)
EOS ABS: 0.4 10*3/uL (ref 0.0–0.7)
EOS PCT: 5 %
HEMATOCRIT: 39.5 % (ref 39.0–52.0)
Hemoglobin: 13.1 g/dL (ref 13.0–17.0)
Lymphocytes Relative: 18 %
Lymphs Abs: 1.5 10*3/uL (ref 0.7–4.0)
MCH: 27.3 pg (ref 26.0–34.0)
MCHC: 33.2 g/dL (ref 30.0–36.0)
MCV: 82.5 fL (ref 78.0–100.0)
MONO ABS: 0.5 10*3/uL (ref 0.1–1.0)
MONOS PCT: 6 %
Neutro Abs: 5.9 10*3/uL (ref 1.7–7.7)
Neutrophils Relative %: 71 %
Platelets: 161 10*3/uL (ref 150–400)
RBC: 4.79 MIL/uL (ref 4.22–5.81)
RDW: 14.5 % (ref 11.5–15.5)
WBC: 8.3 10*3/uL (ref 4.0–10.5)

## 2015-06-28 LAB — PROTIME-INR
INR: 1.17 (ref 0.00–1.49)
Prothrombin Time: 15.1 seconds (ref 11.6–15.2)

## 2015-06-28 LAB — APTT: aPTT: 29 seconds (ref 24–37)

## 2015-06-28 MED ORDER — MIDAZOLAM HCL 2 MG/2ML IJ SOLN
INTRAMUSCULAR | Status: AC
  Filled 2015-06-28: qty 2

## 2015-06-28 MED ORDER — HEPARIN SOD (PORK) LOCK FLUSH 100 UNIT/ML IV SOLN
INTRAVENOUS | Status: AC
  Filled 2015-06-28: qty 20

## 2015-06-28 MED ORDER — LIDOCAINE HCL 1 % IJ SOLN
INTRAMUSCULAR | Status: AC
  Filled 2015-06-28: qty 20

## 2015-06-28 MED ORDER — FENTANYL CITRATE (PF) 100 MCG/2ML IJ SOLN
INTRAMUSCULAR | Status: AC | PRN
  Administered 2015-06-28: 25 ug via INTRAVENOUS

## 2015-06-28 MED ORDER — IOHEXOL 300 MG/ML  SOLN
150.0000 mL | Freq: Once | INTRAMUSCULAR | Status: DC | PRN
  Administered 2015-06-28: 50 mL via INTRA_ARTERIAL
  Filled 2015-06-28: qty 150

## 2015-06-28 MED ORDER — MIDAZOLAM HCL 2 MG/2ML IJ SOLN
INTRAMUSCULAR | Status: AC | PRN
  Administered 2015-06-28: 1 mg via INTRAVENOUS

## 2015-06-28 MED ORDER — HYDROCODONE-ACETAMINOPHEN 5-325 MG PO TABS: 1.0000 | ORAL_TABLET | ORAL | Status: DC | PRN

## 2015-06-28 MED ORDER — FENTANYL CITRATE (PF) 100 MCG/2ML IJ SOLN
INTRAMUSCULAR | Status: AC
  Filled 2015-06-28: qty 2

## 2015-06-28 MED ORDER — HEPARIN SODIUM (PORCINE) 1000 UNIT/ML IJ SOLN
INTRAMUSCULAR | Status: AC | PRN
  Administered 2015-06-28: 2000 [IU] via INTRAVENOUS

## 2015-06-28 MED ORDER — SODIUM CHLORIDE 0.9 % IV SOLN: INTRAVENOUS | Status: DC

## 2015-06-28 NOTE — Op Note (Signed)
DIAGNOSTIC CEREBRAL ANGIOGRAM    OPERATOR:   Dr. Consuella Lose, MD  HISTORY:   The patient is a 69 y.o. yo male with a history of incidentally diagnosed RICA aneurysm, treated with Pipeline embolization 6 months ago. He presents for routine 6 month angiogram.  APPROACH:   The technical aspects of the procedure as well as its potential risks and benefits were reviewed with the patient. These risks included but were not limited bleeding, infection, allergic reaction, damage to organs/vital structures, stroke, non-diagnostic procedure, and the catastrophic outcomes of heart attack, coma, and death. With an understanding of these risks, informed consent was obtained and witnessed.    The patient was placed in the supine position on the angiography table and the skin of right groin prepped in the usual sterile fashion. The procedure was performed under local anesthesia (1%-solution of bicarbonate-bufferred Lidoacaine) and conscious sedation with Versed and fentanyl monitored by the in-suite nurse.    A 5- French sheath was introduced in the right common femoral artery using Seldinger technique.  A fluorophase sequence was used to document the sheath position.    HEPARIN: 2000 Units total.   CONTRAST AGENT: 50cc, Omnipaque 300   FLUOROSCOPY TIME: 4.0 combined AP and lateral minutes    CATHETER(S) AND WIRE(S):    5-French JB-1 glidecatheter   0.035" glidewire    VESSELS CATHETERIZED:   Right common carotid   Right internal carotid Left common carotid Left internal carotid   Right subclavian Left vertebral   Right common femoral  VESSELS STUDIED:   Right common carotid, neck Right internal carotid, head Right vertebral Left common carotid, neck Left internal carotid, head Left vertebral Right femoral  PROCEDURAL NARRATIVE:   A 5-Fr JB-1 terumo glide catheter was advanced over a 0.035 glidewire into the aortic arch. The above vessels were then sequentially catheterized and  cervical/cerebral angiograms taken. After review of images, the catheter was removed without incident.    INTERPRETATION:   Right common carotid: neck:   There is no significant stenosis, occlusion, aneurysm or plaque visualized on this injection.    Right internal carotid: head:   Injection reveals the presence of a widely patent ICA, M1, and A1 segments and their branches. The pipeline device is seen extending from the proximal cavernous to supraclinoid internal carotid artery. The previously seen paraophthalmic aneurysm is no longer filling. There is no in-stent stenosis. The parenchymal and venous phases are normal. The venous sinuses are widely patent.    Left common carotid: neck:   There is no significant stenosis, occlusion, aneurysm or plaque visualized on this injection.    Left internal carotid: head:   Injection reveals the presence of a widely patent ICA, A1, and M1 segments and their branches. Small aneurysm arising from the anterior communicating artery is stable in comparison to prior angiogram. This measures approximately 1.8x1.8 mm and projects superiorly and anteriorly. The parenchymal and venous phases are normal. The venous sinuses are widely patent.    Left vertebral:   Injection reveals the presence of a widely patent vertebral artery. This leads to a widely patent basilar artery that terminates in bilateral P1. The basilar apex is normal. There is no significant stenosis, occlusion, aneurysm, or vascular malformation visualized. The parenchymal and venous phases are normal. The venous sinuses are widely patent.    Right vertebral:    Normal vessel. No PICA aneurysm. See basilar description above.    Right femoral:    Normal vessel. No significant atherosclerotic disease. Arterial sheath  in adequate position.   DISPOSITION:  Upon completion of the study, the femoral sheath was removed and hemostasis obtained using a 5-Fr ExoSeal closure device. Good proximal and distal  lower extremity pulses were documented upon achievement of hemostasis.    The procedure was well tolerated and no early complications were observed.       The patient was transferred back to the holding area to be positioned flat in bed for 3 hours of observation.    IMPRESSION:  1. Complete occlusion of a right paraophthalmic carotid aneurysm 6 months after treatment with the Pipeline embolization device. No in-stent stenosis is seen. 2. 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  The preliminary results of this procedure were shared with the patient and the patient's family.

## 2015-06-28 NOTE — Discharge Instructions (Signed)
Angiogram, Care After °Refer to this sheet in the next few weeks. These instructions provide you with information about caring for yourself after your procedure. Your health care provider may also give you more specific instructions. Your treatment has been planned according to current medical practices, but problems sometimes occur. Call your health care provider if you have any problems or questions after your procedure. °WHAT TO EXPECT AFTER THE PROCEDURE °After your procedure, it is typical to have the following: °· Bruising at the catheter insertion site that usually fades within 1-2 weeks. °· Blood collecting in the tissue (hematoma) that may be painful to the touch. It should usually decrease in size and tenderness within 1-2 weeks. °HOME CARE INSTRUCTIONS °· Take medicines only as directed by your health care provider. °· You may shower 24-48 hours after the procedure or as directed by your health care provider. Remove the bandage (dressing) and gently wash the site with plain soap and water. Pat the area dry with a clean towel. Do not rub the site, because this may cause bleeding. °· Do not take baths, swim, or use a hot tub until your health care provider approves. °· Check your insertion site every day for redness, swelling, or drainage. °· Do not apply powder or lotion to the site. °· Do not lift over 10 lb (4.5 kg) for 5 days after your procedure or as directed by your health care provider. °· Ask your health care provider when it is okay to: °¨ Return to work or school. °¨ Resume usual physical activities or sports. °¨ Resume sexual activity. °· Do not drive home if you are discharged the same day as the procedure. Have someone else drive you. °· You may drive 24 hours after the procedure unless otherwise instructed by your health care provider. °· Do not operate machinery or power tools for 24 hours after the procedure or as directed by your health care provider. °· If your procedure was done as an  outpatient procedure, which means that you went home the same day as your procedure, a responsible adult should be with you for the first 24 hours after you arrive home. °· Keep all follow-up visits as directed by your health care provider. This is important. °SEEK MEDICAL CARE IF: °· You have a fever. °· You have chills. °· You have increased bleeding from the catheter insertion site. Hold pressure on the site. °SEEK IMMEDIATE MEDICAL CARE IF: °· You have unusual pain at the catheter insertion site. °· You have redness, warmth, or swelling at the catheter insertion site. °· You have drainage (other than a small amount of blood on the dressing) from the catheter insertion site. °· The catheter insertion site is bleeding, and the bleeding does not stop after 30 minutes of holding steady pressure on the site. °· The area near or just beyond the catheter insertion site becomes pale, cool, tingly, or numb. °  °This information is not intended to replace advice given to you by your health care provider. Make sure you discuss any questions you have with your health care provider. °  °Document Released: 03/08/2005 Document Revised: 09/10/2014 Document Reviewed: 01/21/2013 °Elsevier Interactive Patient Education ©2016 [employer] ° °

## 2015-06-28 NOTE — H&P (Signed)
CC:  Aneurysm  HPI: Barry Haley is a 69 y.o. 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 He has no new neurologic symptoms, and has been taking daily ASA 325mg  and Plavix 75mg .   PMH: Past Medical History  Diagnosis Date  . Hypertension   . Hyperlipidemia   . Cerebral aneurysm, nonruptured   . Epistaxis   . Anxiety state, unspecified   . Unspecified cerebral artery occlusion with cerebral infarction   . Other shock without mention of trauma   . Personal history of other disorder of urinary system   . Personal history of other diseases of digestive system   . Hx of migraines   . Meningitis 2010  . Sleep apnea     does not wear his cpap  . Diabetes     IDDm x 3 years  . Chronic post-traumatic stress disorder (PTSD)   . Cataracts, bilateral   . Shortness of breath dyspnea   . Anemia 2016    evaluated by Dr Hinton Rao @ [place] [facility]  . Anemia, iron deficiency   . H/O epistaxis   . Anterior cerebral aneurysm 2016  . Neuropathy   . Seborrheic dermatitis   . GERD (gastroesophageal reflux disease)   . History of hiatal hernia   . Benign prostatic hyperplasia (BPH) with urinary urgency   . Nocturia more than twice per night   . High cholesterol   . Difficult intubation 01/2013    /[facility] 01/27/13: anterior cords; unsuccessful with MAC4 and MIL3, successful with #5 Glidescope  . CKD (chronic kidney disease)     Dr. Justin Mend, [facility]  . Aneurysm 01/06/15    brain    PSH: Past Surgical History  Procedure Laterality Date  . Appendectomy      child  . 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 Mitral valve reconstruction with cardiopulmonary bypass  01/27/2013  . Tee without cardioversion N/A 04/22/2014    Procedure: TRANSESOPHAGEAL ECHOCARDIOGRAM (TEE);  Surgeon: Thayer Headings, MD;  Location: [place];  Service: Cardiovascular;  Laterality: N/A;   . Radiology with anesthesia N/A 04/23/2014    Procedure: RADIOLOGY WITH ANESTHESIA;  Surgeon: Medication Radiologist, MD;  Location: [place];  Service: Radiology;  Laterality: N/A;  . Radiology with anesthesia N/A 01/06/2015    Procedure: Pipeline Embolization;  Surgeon: Consuella Lose, MD;  Location: [place];  Service: Radiology;  Laterality: N/A;    SH: Social History  Substance Use Topics  . Smoking status: Former Smoker -- 2.50 packs/day for 40 years    Types: Cigarettes    Quit date: 10/04/2008  . Smokeless tobacco: Never Used  . Alcohol Use: No     Comment: quit: 10/04/2008- rarely now    MEDS: Prior to Admission medications   Medication Sig Start Date End Date Taking? Authorizing Provider  amLODipine (NORVASC) 10 MG tablet Take 1 tablet (10 mg total) by mouth daily. 06/30/14  Yes Velvet Bathe, MD  amoxicillin-clavulanate (AUGMENTIN) 875-125 MG tablet Take 1 tablet by mouth 2 (two) times daily. For 10 days 06/21/15  Yes Historical Provider, MD  aspirin EC 325 MG tablet Take 1 tablet (325 mg total) by mouth daily. 09/27/14  Yes Rosalin Hawking, MD  atorvastatin (LIPITOR) 10 MG tablet Take 10 mg by mouth at bedtime.   Yes Historical Provider, MD  citalopram (CELEXA) 20 MG tablet Take 20 mg by mouth daily. 04/27/14  Yes Geraldo Docker  Sherral Hammers, MD  clopidogrel (PLAVIX) 75 MG tablet Take 75 mg by mouth daily.   Yes Historical Provider, MD  ergocalciferol (VITAMIN D2) 50000 UNITS capsule Take 50,000 Units by mouth every 30 (thirty) days. No specific day   Yes Historical Provider, MD  Fe Fum-FA-B Cmp-C-Zn-Mg-Mn-Cu (HEMOCYTE PLUS PO) Take 1 tablet by mouth daily.   Yes Historical Provider, MD  furosemide (LASIX) 40 MG tablet Take 40 mg by mouth daily.   Yes Historical Provider, MD  glimepiride (AMARYL) 2 MG tablet Take 2 mg by mouth daily with breakfast.   Yes Historical Provider, MD  hydrALAZINE (APRESOLINE) 10 MG tablet Take 10 mg by mouth 3 (three) times daily.    Yes Historical Provider, MD  Insulin  Glargine (LANTUS SOLOSTAR) 100 UNIT/ML Solostar Pen Inject 8 Units into the skin daily at 10 pm. 04/27/14  Yes Allie Bossier, MD  Insulin Pen Needle (AURORA PEN NEEDLES) 29G X 12MM MISC 8 Units by Does not apply route daily. 04/27/14  Yes Allie Bossier, MD  metoprolol succinate (TOPROL-XL) 50 MG 24 hr tablet Take 25 mg by mouth 2 (two) times daily. Take with or immediately following a meal.   Yes Historical Provider, MD  Omega-3 Fatty Acids (FISH OIL PO) Take 1 capsule by mouth daily.   Yes Historical Provider, MD  ranitidine (ZANTAC) 150 MG tablet Take 1 tablet (150 mg total) by mouth 2 (two) times daily. 01/07/15  Yes Consuella Lose, MD  Tamsulosin HCl (FLOMAX) 0.4 MG CAPS Take 0.4 mg by mouth 2 (two) times daily.    Yes Historical Provider, MD  vitamin B-12 (CYANOCOBALAMIN) 1000 MCG tablet Take 1,000 mcg by mouth daily.     Yes Historical Provider, MD    ALLERGY: Allergies  Allergen Reactions  . Hctz [Hydrochlorothiazide] Other (See Comments)    Renal insufficiency  . Niaspan [Niacin Er] Itching  . Zolpidem Anxiety and Other (See Comments)    Bad dreams    ROS: ROS  NEUROLOGIC EXAM: Awake, alert, oriented Memory and concentration grossly intact Speech fluent, appropriate CN grossly intact Motor exam: Upper Extremities Deltoid Bicep Tricep Grip  Right 5/5 5/5 5/5 5/5  Left 5/5 5/5 5/5 5/5   Lower Extremity IP Quad PF DF EHL  Right 5/5 5/5 5/5 5/5 5/5  Left 5/5 5/5 5/5 5/5 5/5   Sensation grossly intact to LT  IMPRESSION: - 69 y.o. male 22mo s/p Pipeline embolization of RICA aneurysm, doing well  PLAN: - Proceed with routine f/u diagnostic angiogram - Likely home post procedure  I have reviewed the risks, benefits, and alternatives to angiogram with the patient in the office. All questions were answered.

## 2015-07-06 DIAGNOSIS — L089 Local infection of the skin and subcutaneous tissue, unspecified: Secondary | ICD-10-CM | POA: Diagnosis not present

## 2015-07-06 DIAGNOSIS — D509 Iron deficiency anemia, unspecified: Secondary | ICD-10-CM | POA: Diagnosis not present

## 2015-07-13 ENCOUNTER — Emergency Department (HOSPITAL_COMMUNITY): Payer: Medicare Other | Admitting: Certified Registered Nurse Anesthetist

## 2015-07-13 ENCOUNTER — Encounter (HOSPITAL_COMMUNITY)
Admission: EM | Disposition: A | Discharge status: Home / Self Care | Payer: Self-pay | Source: Home / Self Care | Attending: Internal Medicine

## 2015-07-13 ENCOUNTER — Inpatient Hospital Stay (HOSPITAL_COMMUNITY)
Admission: EM | Admit: 2015-07-13 | Discharge: 2015-07-14 | DRG: 513 | Disposition: A | Discharge status: Home / Self Care | Payer: Medicare Other | Attending: Internal Medicine | Admitting: Internal Medicine

## 2015-07-13 ENCOUNTER — Encounter (HOSPITAL_COMMUNITY): Payer: Self-pay | Admitting: Emergency Medicine

## 2015-07-13 ENCOUNTER — Emergency Department (HOSPITAL_COMMUNITY): Payer: Medicare Other

## 2015-07-13 DIAGNOSIS — Z9889 Other specified postprocedural states: Secondary | ICD-10-CM

## 2015-07-13 DIAGNOSIS — D509 Iron deficiency anemia, unspecified: Secondary | ICD-10-CM | POA: Diagnosis present

## 2015-07-13 DIAGNOSIS — E1169 Type 2 diabetes mellitus with other specified complication: Secondary | ICD-10-CM | POA: Diagnosis present

## 2015-07-13 DIAGNOSIS — E119 Type 2 diabetes mellitus without complications: Secondary | ICD-10-CM

## 2015-07-13 DIAGNOSIS — M869 Osteomyelitis, unspecified: Principal | ICD-10-CM | POA: Diagnosis present

## 2015-07-13 DIAGNOSIS — N183 Chronic kidney disease, stage 3 unspecified: Secondary | ICD-10-CM | POA: Diagnosis present

## 2015-07-13 DIAGNOSIS — E118 Type 2 diabetes mellitus with unspecified complications: Secondary | ICD-10-CM

## 2015-07-13 DIAGNOSIS — Z87891 Personal history of nicotine dependence: Secondary | ICD-10-CM

## 2015-07-13 DIAGNOSIS — F4312 Post-traumatic stress disorder, chronic: Secondary | ICD-10-CM | POA: Diagnosis present

## 2015-07-13 DIAGNOSIS — I130 Hypertensive heart and chronic kidney disease with heart failure and stage 1 through stage 4 chronic kidney disease, or unspecified chronic kidney disease: Secondary | ICD-10-CM | POA: Diagnosis not present

## 2015-07-13 DIAGNOSIS — M8618 Other acute osteomyelitis, other site: Secondary | ICD-10-CM | POA: Diagnosis not present

## 2015-07-13 DIAGNOSIS — E1122 Type 2 diabetes mellitus with diabetic chronic kidney disease: Secondary | ICD-10-CM | POA: Diagnosis present

## 2015-07-13 DIAGNOSIS — I671 Cerebral aneurysm, nonruptured: Secondary | ICD-10-CM | POA: Diagnosis present

## 2015-07-13 DIAGNOSIS — I10 Essential (primary) hypertension: Secondary | ICD-10-CM | POA: Diagnosis not present

## 2015-07-13 DIAGNOSIS — N189 Chronic kidney disease, unspecified: Secondary | ICD-10-CM | POA: Diagnosis not present

## 2015-07-13 DIAGNOSIS — I5022 Chronic systolic (congestive) heart failure: Secondary | ICD-10-CM | POA: Diagnosis not present

## 2015-07-13 DIAGNOSIS — M86142 Other acute osteomyelitis, left hand: Secondary | ICD-10-CM

## 2015-07-13 DIAGNOSIS — F411 Generalized anxiety disorder: Secondary | ICD-10-CM | POA: Diagnosis present

## 2015-07-13 DIAGNOSIS — M7989 Other specified soft tissue disorders: Secondary | ICD-10-CM | POA: Diagnosis not present

## 2015-07-13 DIAGNOSIS — Z794 Long term (current) use of insulin: Secondary | ICD-10-CM

## 2015-07-13 DIAGNOSIS — E785 Hyperlipidemia, unspecified: Secondary | ICD-10-CM | POA: Diagnosis present

## 2015-07-13 DIAGNOSIS — G4733 Obstructive sleep apnea (adult) (pediatric): Secondary | ICD-10-CM | POA: Diagnosis present

## 2015-07-13 DIAGNOSIS — L929 Granulomatous disorder of the skin and subcutaneous tissue, unspecified: Secondary | ICD-10-CM | POA: Diagnosis not present

## 2015-07-13 DIAGNOSIS — R3915 Urgency of urination: Secondary | ICD-10-CM | POA: Diagnosis present

## 2015-07-13 DIAGNOSIS — N401 Enlarged prostate with lower urinary tract symptoms: Secondary | ICD-10-CM | POA: Diagnosis present

## 2015-07-13 DIAGNOSIS — Z8673 Personal history of transient ischemic attack (TIA), and cerebral infarction without residual deficits: Secondary | ICD-10-CM

## 2015-07-13 DIAGNOSIS — K219 Gastro-esophageal reflux disease without esophagitis: Secondary | ICD-10-CM | POA: Diagnosis present

## 2015-07-13 DIAGNOSIS — M1A9XX1 Chronic gout, unspecified, with tophus (tophi): Secondary | ICD-10-CM | POA: Diagnosis present

## 2015-07-13 DIAGNOSIS — Z888 Allergy status to other drugs, medicaments and biological substances status: Secondary | ICD-10-CM

## 2015-07-13 HISTORY — DX: Type 2 diabetes mellitus without complications: E11.9

## 2015-07-13 HISTORY — PX: I&D EXTREMITY: SHX5045

## 2015-07-13 HISTORY — DX: Osteomyelitis, unspecified: M86.9

## 2015-07-13 HISTORY — PX: INFECTED SKIN DEBRIDEMENT: SHX678

## 2015-07-13 LAB — COMPREHENSIVE METABOLIC PANEL
ALBUMIN: 3.4 g/dL — AB (ref 3.5–5.0)
ALK PHOS: 128 U/L — AB (ref 38–126)
ALT: 40 U/L (ref 17–63)
ANION GAP: 6 (ref 5–15)
AST: 27 U/L (ref 15–41)
BILIRUBIN TOTAL: 0.4 mg/dL (ref 0.3–1.2)
BUN: 23 mg/dL — ABNORMAL HIGH (ref 6–20)
CO2: 26 mmol/L (ref 22–32)
CREATININE: 1.52 mg/dL — AB (ref 0.61–1.24)
Calcium: 9 mg/dL (ref 8.9–10.3)
Chloride: 108 mmol/L (ref 101–111)
GFR calc non Af Amer: 45 mL/min — ABNORMAL LOW (ref >60)
GFR, EST AFRICAN AMERICAN: 52 mL/min — AB (ref >60)
GLUCOSE: 99 mg/dL (ref 65–99)
Potassium: 4 mmol/L (ref 3.5–5.1)
Sodium: 140 mmol/L (ref 135–145)
TOTAL PROTEIN: 7.3 g/dL (ref 6.5–8.1)

## 2015-07-13 LAB — I-STAT CG4 LACTIC ACID, ED
LACTIC ACID, VENOUS: 0.45 mmol/L — AB (ref 0.5–2.0)
Lactic Acid, Venous: 0.78 mmol/L (ref 0.5–2.0)

## 2015-07-13 LAB — CBC WITH DIFFERENTIAL/PLATELET
BASOS ABS: 0 10*3/uL (ref 0.0–0.1)
Basophils Relative: 0 %
EOS PCT: 7 %
Eosinophils Absolute: 0.4 10*3/uL (ref 0.0–0.7)
HCT: 39.4 % (ref 39.0–52.0)
HEMOGLOBIN: 12.9 g/dL — AB (ref 13.0–17.0)
LYMPHS PCT: 19 %
Lymphs Abs: 1.2 10*3/uL (ref 0.7–4.0)
MCH: 26.9 pg (ref 26.0–34.0)
MCHC: 32.7 g/dL (ref 30.0–36.0)
MCV: 82.3 fL (ref 78.0–100.0)
Monocytes Absolute: 0.4 10*3/uL (ref 0.1–1.0)
Monocytes Relative: 6 %
NEUTROS PCT: 68 %
Neutro Abs: 4.3 10*3/uL (ref 1.7–7.7)
PLATELETS: 144 10*3/uL — AB (ref 150–400)
RBC: 4.79 MIL/uL (ref 4.22–5.81)
RDW: 14.2 % (ref 11.5–15.5)
WBC: 6.4 10*3/uL (ref 4.0–10.5)

## 2015-07-13 LAB — GLUCOSE, CAPILLARY: Glucose-Capillary: 70 mg/dL (ref 65–99)

## 2015-07-13 SURGERY — IRRIGATION AND DEBRIDEMENT EXTREMITY
Anesthesia: Monitor Anesthesia Care | Laterality: Left

## 2015-07-13 MED ORDER — MORPHINE SULFATE (PF) 4 MG/ML IV SOLN: 4.0000 mg | Freq: Once | INTRAVENOUS | Status: DC

## 2015-07-13 MED ORDER — CEFAZOLIN SODIUM-DEXTROSE 2-3 GM-% IV SOLR
INTRAVENOUS | Status: AC
  Filled 2015-07-13: qty 50

## 2015-07-13 MED ORDER — FENTANYL CITRATE (PF) 250 MCG/5ML IJ SOLN
INTRAMUSCULAR | Status: AC
  Filled 2015-07-13: qty 5

## 2015-07-13 MED ORDER — MIDAZOLAM HCL 2 MG/2ML IJ SOLN
INTRAMUSCULAR | Status: AC
  Filled 2015-07-13: qty 4

## 2015-07-13 MED ORDER — LACTATED RINGERS IV SOLN
INTRAVENOUS | Status: DC | PRN
  Administered 2015-07-13: 22:00:00 via INTRAVENOUS

## 2015-07-13 MED ORDER — MEPERIDINE HCL 25 MG/ML IJ SOLN: 6.2500 mg | INTRAMUSCULAR | Status: DC | PRN

## 2015-07-13 MED ORDER — LIDOCAINE HCL (CARDIAC) 20 MG/ML IV SOLN
INTRAVENOUS | Status: AC
  Filled 2015-07-13: qty 10

## 2015-07-13 MED ORDER — ARTIFICIAL TEARS OP OINT
TOPICAL_OINTMENT | OPHTHALMIC | Status: AC
  Filled 2015-07-13: qty 7

## 2015-07-13 MED ORDER — SODIUM CHLORIDE 0.9 % IR SOLN
Status: DC | PRN
  Administered 2015-07-13: 3000 mL
  Administered 2015-07-13: 1000 mL

## 2015-07-13 MED ORDER — LIDOCAINE HCL 1 % IJ SOLN
INTRAMUSCULAR | Status: DC | PRN
  Administered 2015-07-13: 5 mL

## 2015-07-13 MED ORDER — CEFAZOLIN SODIUM-DEXTROSE 2-3 GM-% IV SOLR
INTRAVENOUS | Status: DC | PRN
  Administered 2015-07-13: 2 g via INTRAVENOUS

## 2015-07-13 MED ORDER — BUPIVACAINE HCL (PF) 0.25 % IJ SOLN
INTRAMUSCULAR | Status: AC
  Filled 2015-07-13: qty 30

## 2015-07-13 MED ORDER — FENTANYL CITRATE (PF) 100 MCG/2ML IJ SOLN
INTRAMUSCULAR | Status: DC | PRN
  Administered 2015-07-13: 50 ug via INTRAVENOUS

## 2015-07-13 MED ORDER — PROPOFOL 10 MG/ML IV BOLUS
INTRAVENOUS | Status: DC | PRN
  Administered 2015-07-13: 30 mg via INTRAVENOUS

## 2015-07-13 MED ORDER — SUCCINYLCHOLINE CHLORIDE 20 MG/ML IJ SOLN
INTRAMUSCULAR | Status: AC
  Filled 2015-07-13: qty 1

## 2015-07-13 MED ORDER — VANCOMYCIN HCL 10 G IV SOLR
2000.0000 mg | Freq: Once | INTRAVENOUS | Status: AC
  Administered 2015-07-13: 2000 mg via INTRAVENOUS
  Filled 2015-07-13: qty 2000

## 2015-07-13 MED ORDER — HYDROMORPHONE HCL 1 MG/ML IJ SOLN: 0.2500 mg | INTRAMUSCULAR | Status: DC | PRN

## 2015-07-13 MED ORDER — BUPIVACAINE HCL (PF) 0.25 % IJ SOLN
INTRAMUSCULAR | Status: DC | PRN
  Administered 2015-07-13: 5 mL

## 2015-07-13 MED ORDER — VANCOMYCIN HCL 10 G IV SOLR
1250.0000 mg | INTRAVENOUS | Status: DC
  Filled 2015-07-13: qty 1250

## 2015-07-13 MED ORDER — LIDOCAINE HCL (PF) 1 % IJ SOLN
INTRAMUSCULAR | Status: AC
  Filled 2015-07-13: qty 30

## 2015-07-13 MED ORDER — MIDAZOLAM HCL 5 MG/5ML IJ SOLN
INTRAMUSCULAR | Status: DC | PRN
  Administered 2015-07-13 (×2): 1 mg via INTRAVENOUS

## 2015-07-13 MED ORDER — PROPOFOL 10 MG/ML IV BOLUS
INTRAVENOUS | Status: AC
  Filled 2015-07-13: qty 20

## 2015-07-13 MED ORDER — PROMETHAZINE HCL 25 MG/ML IJ SOLN: 6.2500 mg | INTRAMUSCULAR | Status: DC | PRN

## 2015-07-13 MED ORDER — ONDANSETRON HCL 4 MG/2ML IJ SOLN
INTRAMUSCULAR | Status: AC
  Filled 2015-07-13: qty 2

## 2015-07-13 MED ORDER — PHENYLEPHRINE 40 MCG/ML (10ML) SYRINGE FOR IV PUSH (FOR BLOOD PRESSURE SUPPORT)
PREFILLED_SYRINGE | INTRAVENOUS | Status: AC
  Filled 2015-07-13: qty 10

## 2015-07-13 SURGICAL SUPPLY — 41 items
BANDAGE ELASTIC 3 VELCRO ST LF (GAUZE/BANDAGES/DRESSINGS) IMPLANT
BANDAGE ELASTIC 4 VELCRO ST LF (GAUZE/BANDAGES/DRESSINGS) IMPLANT
BNDG COHESIVE 1X5 TAN STRL LF (GAUZE/BANDAGES/DRESSINGS) ×3 IMPLANT
BNDG CONFORM 2 STRL LF (GAUZE/BANDAGES/DRESSINGS) ×3 IMPLANT
BNDG GAUZE ELAST 4 BULKY (GAUZE/BANDAGES/DRESSINGS) IMPLANT
CORDS BIPOLAR (ELECTRODE) IMPLANT
CUFF TOURNIQUET SINGLE 18IN (TOURNIQUET CUFF) IMPLANT
DRAPE SURG 17X23 STRL (DRAPES) ×3 IMPLANT
ELECT REM PT RETURN 9FT ADLT (ELECTROSURGICAL)
ELECTRODE REM PT RTRN 9FT ADLT (ELECTROSURGICAL) IMPLANT
GAUZE PACKING IODOFORM 1/4X5 (PACKING) IMPLANT
GAUZE SPONGE 4X4 12PLY STRL (GAUZE/BANDAGES/DRESSINGS) ×3 IMPLANT
GAUZE XEROFORM 1X8 LF (GAUZE/BANDAGES/DRESSINGS) ×3 IMPLANT
GLOVE BIOGEL M STRL SZ7.5 (GLOVE) ×3 IMPLANT
GOWN STRL REUS W/ TWL LRG LVL3 (GOWN DISPOSABLE) ×2 IMPLANT
GOWN STRL REUS W/TWL LRG LVL3 (GOWN DISPOSABLE) ×4
HANDPIECE INTERPULSE COAX TIP (DISPOSABLE)
KIT BASIN OR (CUSTOM PROCEDURE TRAY) ×3 IMPLANT
KIT ROOM TURNOVER OR (KITS) ×3 IMPLANT
MANIFOLD NEPTUNE II (INSTRUMENTS) ×3 IMPLANT
NEEDLE HYPO 25GX1X1/2 BEV (NEEDLE) IMPLANT
NS IRRIG 1000ML POUR BTL (IV SOLUTION) ×3 IMPLANT
PACK ORTHO EXTREMITY (CUSTOM PROCEDURE TRAY) ×3 IMPLANT
PAD ARMBOARD 7.5X6 YLW CONV (MISCELLANEOUS) ×6 IMPLANT
PAD CAST 4YDX4 CTTN HI CHSV (CAST SUPPLIES) IMPLANT
PADDING CAST COTTON 4X4 STRL (CAST SUPPLIES)
SET CYSTO W/LG BORE CLAMP LF (SET/KITS/TRAYS/PACK) ×3 IMPLANT
SET HNDPC FAN SPRY TIP SCT (DISPOSABLE) IMPLANT
SOAP 2 % CHG 4 OZ (WOUND CARE) ×3 IMPLANT
SPONGE LAP 18X18 X RAY DECT (DISPOSABLE) IMPLANT
SPONGE LAP 4X18 X RAY DECT (DISPOSABLE) ×3 IMPLANT
SUT ETHILON 5 0 P 3 18 (SUTURE) ×2
SUT NYLON ETHILON 5-0 P-3 1X18 (SUTURE) ×1 IMPLANT
SYR CONTROL 10ML LL (SYRINGE) IMPLANT
TOWEL OR 17X24 6PK STRL BLUE (TOWEL DISPOSABLE) ×3 IMPLANT
TOWEL OR 17X26 10 PK STRL BLUE (TOWEL DISPOSABLE) ×3 IMPLANT
TUBE ANAEROBIC SPECIMEN COL (MISCELLANEOUS) ×3 IMPLANT
TUBE CONNECTING 12'X1/4 (SUCTIONS) ×1
TUBE CONNECTING 12X1/4 (SUCTIONS) ×2 IMPLANT
WATER STERILE IRR 1000ML POUR (IV SOLUTION) ×3 IMPLANT
YANKAUER SUCT BULB TIP NO VENT (SUCTIONS) ×3 IMPLANT

## 2015-07-13 NOTE — H&P (Signed)
Reason for Consult:infected finger Referring Physician: ER  CC:My finger is infected  HPI:  Barry Haley is an 69 y.o. right handed male who presents with  Red swollen long finger.  Pt cut his long finger with steak knife ~2wks ago, saw his pmd, started on augmentin, then placed on keflex total abx 15 days prior to admission, finger still swollen, red, although some better, sent to ER .  Currently denies fever, chills. Pain is rated at    4/10 and is described as dull.  Pain is constant.  Pain is made better by rest/immobilization, worse with motion.   Associated signs/symptoms:better on abx, but still red, swollen, painful Previous treatment:    Past Medical History  Diagnosis Date  . Hypertension   . Hyperlipidemia   . Cerebral aneurysm, nonruptured   . Epistaxis   . Anxiety state, unspecified   . Unspecified cerebral artery occlusion with cerebral infarction   . Other shock without mention of trauma   . Personal history of other disorder of urinary system   . Personal history of other diseases of digestive system   . Hx of migraines   . Meningitis 2010  . Sleep apnea     does not wear his cpap  . Diabetes ([place])     IDDm x 3 years  . Chronic post-traumatic stress disorder (PTSD)   . Cataracts, bilateral   . Shortness of breath dyspnea   . Anemia 2016    evaluated by Dr Hinton Rao @ [place] [facility]  . Anemia, iron deficiency   . H/O epistaxis   . Anterior cerebral aneurysm 2016  . Neuropathy ([place])   . Seborrheic dermatitis   . GERD (gastroesophageal reflux disease)   . History of hiatal hernia   . Benign prostatic hyperplasia (BPH) with urinary urgency   . Nocturia more than twice per night   . High cholesterol   . Difficult intubation 01/2013    /[facility] 01/27/13: anterior cords; unsuccessful with MAC4 and MIL3, successful with #5 Glidescope  . CKD (chronic kidney disease)     Dr. Justin Mend, [facility]  . Aneurysm ([place]) 01/06/15    brain    Past Surgical  History  Procedure Laterality Date  . Appendectomy      child  . 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 Mitral valve reconstruction with cardiopulmonary bypass  01/27/2013  . Tee without cardioversion N/A 04/22/2014    Procedure: TRANSESOPHAGEAL ECHOCARDIOGRAM (TEE);  Surgeon: Thayer Headings, MD;  Location: [place];  Service: Cardiovascular;  Laterality: N/A;  . Radiology with anesthesia N/A 04/23/2014    Procedure: RADIOLOGY WITH ANESTHESIA;  Surgeon: Medication Radiologist, MD;  Location: [place];  Service: Radiology;  Laterality: N/A;  . Radiology with anesthesia N/A 01/06/2015    Procedure: Pipeline Embolization;  Surgeon: Consuella Lose, MD;  Location: [place];  Service: Radiology;  Laterality: N/A;    Family History  Problem Relation Age of Onset  . Heart failure Mother   . Diabetes Mother   . Cervical cancer Mother   . Alzheimer's disease Father   . Diabetes Father   . Diabetes Brother     Social History:  reports that he quit smoking about 6 years ago. His smoking use included Cigarettes. He has a 100 pack-year smoking history. He has never used smokeless tobacco. He reports that he does not drink alcohol or use illicit drugs.  Allergies:  Allergies  Allergen Reactions  . Hctz [Hydrochlorothiazide] Other (See Comments)  Renal insufficiency  . Niaspan [Niacin Er] Itching  . Zolpidem Anxiety and Other (See Comments)    Bad dreams    Medications: I have reviewed the patient's current medications.  Results for orders placed or performed during the hospital encounter of 07/13/15 (from the past 48 hour(s))  Comprehensive metabolic panel     Status: Abnormal   Collection Time: 07/13/15  6:14 PM  Result Value Ref Range   Sodium 140 135 - 145 mmol/L   Potassium 4.0 3.5 - 5.1 mmol/L   Chloride 108 101 - 111 mmol/L   CO2 26 22 - 32 mmol/L   Glucose, Bld 99 65 - 99 mg/dL   BUN 23 (H) 6 - 20 mg/dL   Creatinine, Ser 1.52 (H) 0.61 - 1.24 mg/dL   Calcium  9.0 8.9 - 10.3 mg/dL   Total Protein 7.3 6.5 - 8.1 g/dL   Albumin 3.4 (L) 3.5 - 5.0 g/dL   AST 27 15 - 41 U/L   ALT 40 17 - 63 U/L   Alkaline Phosphatase 128 (H) 38 - 126 U/L   Total Bilirubin 0.4 0.3 - 1.2 mg/dL   GFR calc non Af Amer 45 (L) >60 mL/min   GFR calc Af Amer 52 (L) >60 mL/min    Comment: (NOTE) The eGFR has been calculated using the CKD EPI equation. This calculation has not been validated in all clinical situations. eGFR's persistently <60 mL/min signify possible Chronic Kidney Disease.    Anion gap 6 5 - 15  CBC with Differential     Status: Abnormal   Collection Time: 07/13/15  6:14 PM  Result Value Ref Range   WBC 6.4 4.0 - 10.5 K/uL   RBC 4.79 4.22 - 5.81 MIL/uL   Hemoglobin 12.9 (L) 13.0 - 17.0 g/dL   HCT 39.4 39.0 - 52.0 %   MCV 82.3 78.0 - 100.0 fL   MCH 26.9 26.0 - 34.0 pg   MCHC 32.7 30.0 - 36.0 g/dL   RDW 14.2 11.5 - 15.5 %   Platelets 144 (L) 150 - 400 K/uL   Neutrophils Relative % 68 %   Neutro Abs 4.3 1.7 - 7.7 K/uL   Lymphocytes Relative 19 %   Lymphs Abs 1.2 0.7 - 4.0 K/uL   Monocytes Relative 6 %   Monocytes Absolute 0.4 0.1 - 1.0 K/uL   Eosinophils Relative 7 %   Eosinophils Absolute 0.4 0.0 - 0.7 K/uL   Basophils Relative 0 %   Basophils Absolute 0.0 0.0 - 0.1 K/uL  I-Stat CG4 Lactic Acid, ED  (not at [facility])     Status: Abnormal   Collection Time: 07/13/15  6:20 PM  Result Value Ref Range   Lactic Acid, Venous 0.45 (L) 0.5 - 2.0 mmol/L    Dg Finger Middle Left  07/13/2015  CLINICAL DATA:  INFECTION in left middle finger. Pt. Nicked the proximal region of the middle phalanx of the left middle finger with a steak knife protruding from dishwasher about 2 weeks ago. On round 2 of antibiotics and finger not getting better. Swelling and pain from this region through distal phalanx. Hx of diabetes mellitus. EXAM: LEFT MIDDLE FINGER 2+V COMPARISON:  None. FINDINGS: No fracture or dislocation. There is bone resorption along the distal aspect of the  middle phalanx of the middle finger and the palmar base of the distal phalanx. There is associated focal osteopenia. There is narrowing of the DIP joint space and surrounding soft tissue swelling. No soft tissue air. The PIP  and MCP joints are unremarkable. IMPRESSION: 1. Osteomyelitis involving the distal aspect of the middle phalanx of the left middle finger and the base of the distal phalanx. The changes lie on each side of the DIP joint concerning for septic arthritis. There is diffuse surrounding soft tissue swelling. Electronically Signed   By: Lajean Manes M.D.   On: 07/13/2015 19:51    Pertinent items are noted in HPI. Temp:  [97.6 F (36.4 C)] 97.6 F (36.4 C) (11/09 1751) Pulse Rate:  [100] 100 (11/09 1751) Resp:  [20] 20 (11/09 1751) BP: (136)/(78) 136/78 mmHg (11/09 1751) SpO2:  [98 %] 98 % (11/09 1751) General appearance: alert and cooperative Resp: no resp distress Cardio: regular rate and rhythm Left Long Finger: red swollen dipj, painful with rom, no pulp tenderness, no tendon sheath tenderness  Assessment: dipj osteomyelitis Plan: I&d, cultures, admit overnight, picc for lt abx, needs probiotics I have discussed this treatment plan in detail with patient , including the risks of the recommended treatment or surgery, the benefits and the alternatives.  The patient understands that additional treatment may be necessary.  Tonishia Steffy CHRISTOPHER 07/13/2015, 9:15 PM

## 2015-07-13 NOTE — Anesthesia Procedure Notes (Signed)
Procedure Name: MAC Date/Time: 07/13/2015 10:08 PM Performed by: Zorita Pang Pre-anesthesia Checklist: Patient identified, Timeout performed, Emergency Drugs available, Suction available and Patient being monitored Patient Re-evaluated:Patient Re-evaluated prior to inductionOxygen Delivery Method: Simple face mask Placement Confirmation: positive ETCO2 Comments: Difficult airway noted on previous anesthetic; Dr. Lenon Curt agrees to proceed with block.

## 2015-07-13 NOTE — ED Provider Notes (Signed)
CSN: [id number]     Arrival date & time 07/13/15  1746 History  By signing my name below, I, Meriel Pica, attest that this documentation has been prepared under the direction and in the presence of Domenic Moras, PA-C.  Electronically Signed: Meriel Pica, ED Scribe. 07/13/2015. 7:01 PM.   Chief Complaint  Patient presents with  . Finger Injury    The patient think he stabbed himself with a steak knife three weeks ago.  He says he was reaching in the dishwasher and stabbed it then.  It is his left, middle finger.     The history is provided by the patient. No language interpreter was used.   HPI Comments: Blaine Mccalla is a 69 y.o. male, with a PMhx of HTN and DM, who presents to the Emergency Department complaining of constant, moderate throbbing pain to left, middle finger with associated swelling and erythema s/p laceration sustained 2 weeks ago. Pt reports he was unloading the dishwasher when he believes he cut his left, middle finger on a steak knife 2 weeks ago. The pt was subsequently evaluated by an [facility] where he was prescribed 10 days of Augmentin. PCP then prescribed Keflex for 5 days after he failed Augmentin therapy. He denies relief with both Keflex and Augmentin. The pt was advised by his PCP to come to the ED this evening for evaluation s/p faild abx therapy.   Mr. Kalinoski underwent pipeline embolization surgery 6 months ago at [facility]. He is currently taking daily 325 mg Aspirin but no other blood thinning medication.    Past Medical History  Diagnosis Date  . Hypertension   . Hyperlipidemia   . Cerebral aneurysm, nonruptured   . Epistaxis   . Anxiety state, unspecified   . Unspecified cerebral artery occlusion with cerebral infarction   . Other shock without mention of trauma   . Personal history of other disorder of urinary system   . Personal history of other diseases of digestive system   . Hx of migraines   . Meningitis 2010  . Sleep apnea     does not wear his cpap   . Diabetes ([place])     IDDm x 3 years  . Chronic post-traumatic stress disorder (PTSD)   . Cataracts, bilateral   . Shortness of breath dyspnea   . Anemia 2016    evaluated by Dr Hinton Rao @ [facility]  . Anemia, iron deficiency   . H/O epistaxis   . Anterior cerebral aneurysm 2016  . Neuropathy ([place])   . Seborrheic dermatitis   . GERD (gastroesophageal reflux disease)   . History of hiatal hernia   . Benign prostatic hyperplasia (BPH) with urinary urgency   . Nocturia more than twice per night   . High cholesterol   . Difficult intubation 01/2013    /[facility] 01/27/13: anterior cords; unsuccessful with MAC4 and MIL3, successful with #5 Glidescope  . CKD (chronic kidney disease)     Dr. Justin Mend, [facility]  . Aneurysm ([place]) 01/06/15    brain   Past Surgical History  Procedure Laterality Date  . Appendectomy      child  . 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 Mitral valve reconstruction with cardiopulmonary bypass  01/27/2013  . Tee without cardioversion N/A 04/22/2014    Procedure: TRANSESOPHAGEAL ECHOCARDIOGRAM (TEE);  Surgeon: Thayer Headings, MD;  Location: [place];  Service: Cardiovascular;  Laterality: N/A;  . Radiology with anesthesia N/A 04/23/2014    Procedure: RADIOLOGY  WITH ANESTHESIA;  Surgeon: Medication Radiologist, MD;  Location: [place];  Service: Radiology;  Laterality: N/A;  . Radiology with anesthesia N/A 01/06/2015    Procedure: Pipeline Embolization;  Surgeon: Consuella Lose, MD;  Location: [place];  Service: Radiology;  Laterality: N/A;   Family History  Problem Relation Age of Onset  . Heart failure Mother   . Diabetes Mother   . Cervical cancer Mother   . Alzheimer's disease Father   . Diabetes Father   . Diabetes Brother    Social History  Substance Use Topics  . Smoking status: Former Smoker -- 2.50 packs/day for 40 years    Types: Cigarettes    Quit date: 10/04/2008  . Smokeless tobacco: Never Used  .  Alcohol Use: No     Comment: quit: 10/04/2008- rarely now    Review of Systems  Constitutional: Negative for fever.  Musculoskeletal: Positive for arthralgias.  Skin: Positive for color change.   Allergies  Hctz; Niaspan; and Zolpidem  Home Medications   Prior to Admission medications   Medication Sig Start Date End Date Taking? Authorizing Provider  amLODipine (NORVASC) 10 MG tablet Take 1 tablet (10 mg total) by mouth daily. 06/30/14   Velvet Bathe, MD  amoxicillin-clavulanate (AUGMENTIN) 875-125 MG tablet Take 1 tablet by mouth 2 (two) times daily. For 10 days 06/21/15   Historical Provider, MD  aspirin EC 325 MG tablet Take 1 tablet (325 mg total) by mouth daily. 09/27/14   Rosalin Hawking, MD  atorvastatin (LIPITOR) 10 MG tablet Take 10 mg by mouth at bedtime.    Historical Provider, MD  citalopram (CELEXA) 20 MG tablet Take 20 mg by mouth daily. 04/27/14   Allie Bossier, MD  clopidogrel (PLAVIX) 75 MG tablet Take 75 mg by mouth daily.    Historical Provider, MD  ergocalciferol (VITAMIN D2) 50000 UNITS capsule Take 50,000 Units by mouth every 30 (thirty) days. No specific day    Historical Provider, MD  Fe Fum-FA-B Cmp-C-Zn-Mg-Mn-Cu (HEMOCYTE PLUS PO) Take 1 tablet by mouth daily.    Historical Provider, MD  furosemide (LASIX) 40 MG tablet Take 40 mg by mouth daily.    Historical Provider, MD  glimepiride (AMARYL) 2 MG tablet Take 2 mg by mouth daily with breakfast.    Historical Provider, MD  hydrALAZINE (APRESOLINE) 10 MG tablet Take 10 mg by mouth 3 (three) times daily.     Historical Provider, MD  Insulin Glargine (LANTUS SOLOSTAR) 100 UNIT/ML Solostar Pen Inject 8 Units into the skin daily at 10 pm. 04/27/14   Allie Bossier, MD  Insulin Pen Needle (AURORA PEN NEEDLES) 29G X 12MM MISC 8 Units by Does not apply route daily. 04/27/14   Allie Bossier, MD  metoprolol succinate (TOPROL-XL) 50 MG 24 hr tablet Take 25 mg by mouth 2 (two) times daily. Take with or immediately following a meal.     Historical Provider, MD  Omega-3 Fatty Acids (FISH OIL PO) Take 1 capsule by mouth daily.    Historical Provider, MD  ranitidine (ZANTAC) 150 MG tablet Take 1 tablet (150 mg total) by mouth 2 (two) times daily. 01/07/15   Consuella Lose, MD  Tamsulosin HCl (FLOMAX) 0.4 MG CAPS Take 0.4 mg by mouth 2 (two) times daily.     Historical Provider, MD  vitamin B-12 (CYANOCOBALAMIN) 1000 MCG tablet Take 1,000 mcg by mouth daily.      Historical Provider, MD   BP 136/78 mmHg  Pulse 100  Temp(Src) 97.6 F (36.4  C) (Oral)  Resp 20  SpO2 98% Physical Exam  Constitutional: He is oriented to person, place, and time. He appears well-developed and well-nourished. No distress.  HENT:  Head: Normocephalic.  Eyes: Conjunctivae are normal.  Neck: Normal range of motion. Neck supple.  Cardiovascular: Normal rate.   Pulmonary/Chest: Effort normal. No respiratory distress.  Musculoskeletal: Normal range of motion.  Neurological: He is alert and oriented to person, place, and time. Coordination normal.  Skin: Skin is warm. There is erythema.  Left hand, middle finger; moderate edema noted to distal finger involving the middle phalanx and distal phalanx with surrounding erythema, no obvious fluctuance noted, intact ROM through PIP.  Decreased ROM at DIP joint, no nail involvement, normal pad of finger, brisk cap refill.   Psychiatric: He has a normal mood and affect. His behavior is normal.  Nursing note and vitals reviewed.   ED Course  Procedures  DIAGNOSTIC STUDIES: Oxygen Saturation is 98% on RA, normal by my interpretation.    COORDINATION OF CARE: 6:54 PM Discussed treatment plan with pt at bedside and pt agreed to plan. Will order imaging of left middle finger.   8:27 PM Pt with persistent L middle finger infection, unresolved with augmentin and keflex.  Xray of finger demonstrates evidence of osteomyelitis and signs concerning for septic joint of DIP.  I have consulted with oncall hand  specialist DR. Coley who agrees to see pt in ER and will determine further management.  Pt is afebrile, vss, normal lactic acid, no leukocytosis.  Pt nontoxic in appearance.    9:36 PM Dr. Lenon Curt has seen and evaluated pt. He will take pt to OR for a washout.  He request Vancomycin to be started and for hospitalist to admit pt.  I have consulted [facility] Dr. Fuller Plan who agrees to admit pt to med surg bed under his care.  Pt will also need a PICC line placed for long term ABX use.  Pt is aware and agrees with plan.            Labs Review  Labs Reviewed  COMPREHENSIVE METABOLIC PANEL - Abnormal; Notable for the following:    BUN 23 (*)    Creatinine, Ser 1.52 (*)    Albumin 3.4 (*)    Alkaline Phosphatase 128 (*)    GFR calc non Af Amer 45 (*)    GFR calc Af Amer 52 (*)    All other components within normal limits  CBC WITH DIFFERENTIAL/PLATELET - Abnormal; Notable for the following:    Hemoglobin 12.9 (*)    Platelets 144 (*)    All other components within normal limits  I-STAT CG4 LACTIC ACID, ED - Abnormal; Notable for the following:    Lactic Acid, Venous 0.45 (*)    All other components within normal limits  I-STAT CG4 LACTIC ACID, ED    Imaging Review Dg Finger Middle Left  07/13/2015  CLINICAL DATA:  INFECTION in left middle finger. Pt. Nicked the proximal region of the middle phalanx of the left middle finger with a steak knife protruding from dishwasher about 2 weeks ago. On round 2 of antibiotics and finger not getting better. Swelling and pain from this region through distal phalanx. Hx of diabetes mellitus. EXAM: LEFT MIDDLE FINGER 2+V COMPARISON:  None. FINDINGS: No fracture or dislocation. There is bone resorption along the distal aspect of the middle phalanx of the middle finger and the palmar base of the distal phalanx. There is associated focal osteopenia. There  is narrowing of the DIP joint space and surrounding soft tissue swelling. No soft tissue  air. The PIP and MCP joints are unremarkable. IMPRESSION: 1. Osteomyelitis involving the distal aspect of the middle phalanx of the left middle finger and the base of the distal phalanx. The changes lie on each side of the DIP joint concerning for septic arthritis. There is diffuse surrounding soft tissue swelling. Electronically Signed   By: Lajean Manes M.D.   On: 07/13/2015 19:51   I have personally reviewed and evaluated these images and lab results as part of my medical decision-making.   MDM   Final diagnoses:  Finger osteomyelitis, left (HCC)   BP 136/78 mmHg  Pulse 100  Temp(Src) 97.6 F (36.4 C) (Oral)  Resp 20  SpO2 98%   I personally performed the services described in this documentation, which was scribed in my presence. The recorded information has been reviewed and is accurate.      Domenic Moras, PA-C 07/13/15 2141  Leo Grosser, MD 07/15/15 [phone]

## 2015-07-13 NOTE — ED Notes (Addendum)
Report given to OR. Consent signed. Pt transported to holding unit. All valuables locked up with security.

## 2015-07-13 NOTE — Progress Notes (Signed)
ANTIBIOTIC CONSULT NOTE - INITIAL  Pharmacy Consult for vancomycin Indication: septic joint  Allergies  Allergen Reactions  . Hctz [Hydrochlorothiazide] Other (See Comments)    Renal insufficiency  . Niaspan [Niacin Er] Itching  . Zolpidem Anxiety and Other (See Comments)    Bad dreams    Patient Measurements:    Vital Signs: Temp: 97.6 F (36.4 C) (11/09 1751) Temp Source: Oral (11/09 1751) BP: 136/78 mmHg (11/09 1751) Pulse Rate: 100 (11/09 1751) Intake/Output from previous day:   Intake/Output from this shift:    Labs:  Recent Labs  07/13/15 1814  WBC 6.4  HGB 12.9*  PLT 144*  CREATININE 1.52*   CrCl cannot be calculated (Unknown ideal weight.). No results for input(s): VANCOTROUGH, VANCOPEAK, VANCORANDOM, GENTTROUGH, GENTPEAK, GENTRANDOM, TOBRATROUGH, TOBRAPEAK, TOBRARND, AMIKACINPEAK, AMIKACINTROU, AMIKACIN in the last 72 hours.   Microbiology: No results found for this or any previous visit (from the past 720 hour(s)).  Medical History: Past Medical History  Diagnosis Date  . Hypertension   . Hyperlipidemia   . Cerebral aneurysm, nonruptured   . Epistaxis   . Anxiety state, unspecified   . Unspecified cerebral artery occlusion with cerebral infarction   . Other shock without mention of trauma   . Personal history of other disorder of urinary system   . Personal history of other diseases of digestive system   . Hx of migraines   . Meningitis 2010  . Sleep apnea     does not wear his cpap  . Diabetes ([place])     IDDm x 3 years  . Chronic post-traumatic stress disorder (PTSD)   . Cataracts, bilateral   . Shortness of breath dyspnea   . Anemia 2016    evaluated by Dr Hinton Rao @ [facility]  . Anemia, iron deficiency   . H/O epistaxis   . Anterior cerebral aneurysm 2016  . Neuropathy ([place])   . Seborrheic dermatitis   . GERD (gastroesophageal reflux disease)   . History of hiatal hernia   . Benign prostatic hyperplasia (BPH) with  urinary urgency   . Nocturia more than twice per night   . High cholesterol   . Difficult intubation 01/2013    /[facility] 01/27/13: anterior cords; unsuccessful with MAC4 and MIL3, successful with #5 Glidescope  . CKD (chronic kidney disease)     Dr. Justin Mend, [facility]  . Aneurysm ([place]) 01/06/15    brain    Assessment: 47 yom s/p punctured finger several weeks ago with steak knife. Finger is painful to touch. Pharmacy consulted to dose vancomycin for septic joint. Failed Keflex, Augmentin courses outpatient. Afebrile, wbc wnl, no cultures. SCr 1.52 on admit, wt~100kg. Normalized CrCl~46.  11/9 vanc>>  Goal of Therapy:  Vancomycin trough level 15-20 mcg/ml  Plan:  Vanc 2g IV x 1 dose; then Vanc 1250mg  IV q24h Monitor clinical progress, c/s, renal function, abx plan/LOT VT@SS  as indicated  Elicia Lamp, PharmD Clinical Pharmacist Pager [phone] 07/13/2015 9:25 PM

## 2015-07-13 NOTE — Anesthesia Preprocedure Evaluation (Addendum)
Anesthesia Evaluation   Patient awake    Reviewed: Allergy & Precautions, NPO status , Patient's Chart, lab work & pertinent test results  History of Anesthesia Complications (+) DIFFICULT AIRWAY  Airway Mallampati: II  TM Distance: >3 FB     Dental  (+) Dental Advisory Given   Pulmonary shortness of breath, sleep apnea , former smoker,    Pulmonary exam normal        Cardiovascular hypertension, Pt. on medications + Peripheral Vascular Disease and +CHF  + Valvular Problems/Murmurs  Rhythm:Regular Rate:Normal     Neuro/Psych PSYCHIATRIC DISORDERS Anxiety CVA    GI/Hepatic hiatal hernia, GERD  ,  Endo/Other  diabetes, Type 1  Renal/GU Renal disease     Musculoskeletal   Abdominal   Peds  Hematology  (+) anemia ,   Anesthesia Other Findings   Reproductive/Obstetrics                             Anesthesia Physical  Anesthesia Plan  ASA: III  Anesthesia Plan: MAC   Post-op Pain Management:    Induction: Intravenous  Airway Management Planned:   Additional Equipment:   Intra-op Plan:   Post-operative Plan: Extubation in OR  Informed Consent: I have reviewed the patients History and Physical, chart, labs and discussed the procedure including the risks, benefits and alternatives for the proposed anesthesia with the patient or authorized representative who has indicated his/her understanding and acceptance.   Dental advisory given  Plan Discussed with: CRNA  Anesthesia Plan Comments:        Anesthesia Quick Evaluation

## 2015-07-13 NOTE — Transfer of Care (Signed)
Immediate Anesthesia Transfer of Care Note  Patient: Barry Haley  Procedure(s) Performed: Procedure(s): IRRIGATION AND DEBRIDEMENT LEFT LONG FINGER (Left)  Patient Location: PACU  Anesthesia Type:MAC and Regional  Level of Consciousness: awake, alert , oriented and patient cooperative  Airway & Oxygen Therapy: Patient Spontanous Breathing  Post-op Assessment: Report given to RN and Post -op Vital signs reviewed and stable  Post vital signs: Reviewed and stable  Last Vitals:  Filed Vitals:   07/13/15 1751  BP: 136/78  Pulse: 100  Temp: 36.4 C  Resp: 20    Complications: No apparent anesthesia complications

## 2015-07-13 NOTE — H&P (Addendum)
[facility] History and Physical  Barry Haley ZOX:[id number] DOB: [birth year] DOA: 07/13/2015  Referring physician: ED PCP: [name], MD   Chief Complaint: infected finger HPI:  Barry Haley is a 69 year old male with past medical history HTN, HLD, IDDM, CKD,  iron deficiency anemia, OSA not on CPAP, PTSD, cerebral aneurysm,s/p MV and TV repair; who presents with complaints of pain and swelling  his left middle finger after sustaining a cut to it on a steak knife while emptying the dishwasher 2 weeks ago. Patient notes that he initially went to his primary care provider regarding the cut. His PCP, Dr. Lin Landsman placed him on antibiotics Augmentin for approximately 10 days and then Keflex for approximately 5 days. However, the finger did not improve and he noted progressively worsening redness, swelling, and moderate pain despite antibiotic use. At that time it was advised that he see a hand specialist, but it was going to take approximately 2 weeks to get into the office and therefore he was advised to proceed to the emergency department for further evaluation. Patient notes multiple other medical problems that have all been relatively stable including his insulin-dependent diabetes. Patient reports that his last hemoglobin A1c was approximately 1 month ago on 6.9. Otherwise he notes that he had a cerebral angiogram about 2 weeks ago to reevaluate a RICA aneurysm after he had underwent Pipeline embolization earlier in the year. He states that everything was fine with the procedure and he said no complications. Note his baseline creatinine is somewhere around 1.51.   After the patient was seen in the emergency department x-rays were obtained and showed signs of osteomyelitis involving the distal aspect of the middle phalanx of the left finger. Patient was evaluated by Dr. Lenon Curt of orthopedics and it was determined to take the patient to surgery for further evaluation.     Review of  Systems  Constitutional: Negative for fever, chills and diaphoresis.  HENT: Negative for ear pain and hearing loss.   Respiratory: Negative for hemoptysis and sputum production.   Cardiovascular: Negative for chest pain and claudication.  Gastrointestinal: Negative for abdominal pain, diarrhea and constipation.  Genitourinary: Positive for frequency. Negative for dysuria and urgency.  Musculoskeletal: Positive for joint pain.  Skin: Negative for itching and rash.       Redness and swelling of the left middle finger  Neurological: Negative for focal weakness.  Endo/Heme/Allergies: Positive for environmental allergies. Does not bruise/bleed easily.  Psychiatric/Behavioral: Negative for hallucinations and memory loss.         Past Medical History  Diagnosis Date  . Hypertension   . Hyperlipidemia   . Cerebral aneurysm, nonruptured   . Epistaxis   . Anxiety state, unspecified   . Unspecified cerebral artery occlusion with cerebral infarction   . Other shock without mention of trauma   . Personal history of other disorder of urinary system   . Personal history of other diseases of digestive system   . Hx of migraines   . Meningitis 2010  . Sleep apnea     does not wear his cpap  . Diabetes ([place])     IDDm x 3 years  . Chronic post-traumatic stress disorder (PTSD)   . Cataracts, bilateral   . Shortness of breath dyspnea   . Anemia 2016    evaluated by Dr Hinton Rao @ [facility]  . Anemia, iron deficiency   . H/O epistaxis   . Anterior cerebral aneurysm 2016  . Neuropathy ([place])   .  Seborrheic dermatitis   . GERD (gastroesophageal reflux disease)   . History of hiatal hernia   . Benign prostatic hyperplasia (BPH) with urinary urgency   . Nocturia more than twice per night   . High cholesterol   . Difficult intubation 01/2013    /[facility] 01/27/13: anterior cords; unsuccessful with MAC4 and MIL3, successful with #5 Glidescope  . CKD (chronic kidney disease)     Dr.  Justin Mend, [facility]  . Aneurysm ([place]) 01/06/15    brain     Past Surgical History  Procedure Laterality Date  . Appendectomy      child  . 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 Mitral valve reconstruction with cardiopulmonary bypass  01/27/2013  . Tee without cardioversion N/A 04/22/2014    Procedure: TRANSESOPHAGEAL ECHOCARDIOGRAM (TEE);  Surgeon: Thayer Headings, MD;  Location: [place];  Service: Cardiovascular;  Laterality: N/A;  . Radiology with anesthesia N/A 04/23/2014    Procedure: RADIOLOGY WITH ANESTHESIA;  Surgeon: Medication Radiologist, MD;  Location: [place];  Service: Radiology;  Laterality: N/A;  . Radiology with anesthesia N/A 01/06/2015    Procedure: Pipeline Embolization;  Surgeon: Consuella Lose, MD;  Location: [place];  Service: Radiology;  Laterality: N/A;      Social History:  reports that he quit smoking about 6 years ago. His smoking use included Cigarettes. He has a 100 pack-year smoking history. He has never used smokeless tobacco. He reports that he does not drink alcohol or use illicit drugs. Where does patient live--home     and with whom if at home? Wife Can patient participate in ADLs? Yes  Allergies  Allergen Reactions  . Hctz [Hydrochlorothiazide] Other (See Comments)    Renal insufficiency  . Niaspan [Niacin Er] Itching  . Zolpidem Anxiety and Other (See Comments)    Bad dreams    Family History  Problem Relation Age of Onset  . Heart failure Mother   . Diabetes Mother   . Cervical cancer Mother   . Alzheimer's disease Father   . Diabetes Father   . Diabetes Brother       Prior to Admission medications   Medication Sig Start Date End Date Taking? Authorizing Provider  amLODipine (NORVASC) 10 MG tablet Take 1 tablet (10 mg total) by mouth daily. 06/30/14  Yes Velvet Bathe, MD  aspirin EC 325 MG tablet Take 1 tablet (325 mg total) by mouth daily. 09/27/14  Yes Rosalin Hawking, MD  atorvastatin (LIPITOR) 10 MG  tablet Take 10 mg by mouth at bedtime.   Yes Historical Provider, MD  citalopram (CELEXA) 20 MG tablet Take 20 mg by mouth daily. 04/27/14  Yes Allie Bossier, MD  ergocalciferol (VITAMIN D2) 50000 UNITS capsule Take 50,000 Units by mouth every 30 (thirty) days. No specific day   Yes Historical Provider, MD  esomeprazole (NEXIUM) 40 MG capsule Take 40 mg by mouth daily at 12 noon.   Yes Historical Provider, MD  Fe Fum-FA-B Cmp-C-Zn-Mg-Mn-Cu (HEMOCYTE PLUS PO) Take 1 tablet by mouth daily.   Yes Historical Provider, MD  furosemide (LASIX) 40 MG tablet Take 40 mg by mouth daily.   Yes Historical Provider, MD  glimepiride (AMARYL) 2 MG tablet Take 2 mg by mouth daily with breakfast.   Yes Historical Provider, MD  hydrALAZINE (APRESOLINE) 10 MG tablet Take 10 mg by mouth 3 (three) times daily.    Yes Historical Provider, MD  Insulin Glargine (LANTUS SOLOSTAR) 100 UNIT/ML Solostar Pen Inject 8 Units into  the skin daily at 10 pm. 04/27/14  Yes Allie Bossier, MD  metoprolol succinate (TOPROL-XL) 50 MG 24 hr tablet Take 25 mg by mouth 2 (two) times daily. Take with or immediately following a meal.   Yes Historical Provider, MD  Omega-3 Fatty Acids (FISH OIL PO) Take 1 capsule by mouth daily.   Yes Historical Provider, MD  Tamsulosin HCl (FLOMAX) 0.4 MG CAPS Take 0.4 mg by mouth 2 (two) times daily.    Yes Historical Provider, MD  vitamin B-12 (CYANOCOBALAMIN) 1000 MCG tablet Take 1,000 mcg by mouth daily.     Yes Historical Provider, MD  cephALEXin (KEFLEX) 500 MG capsule Take 500 mg by mouth 2 (two) times daily. 07/06/15   Historical Provider, MD  Insulin Pen Needle (AURORA PEN NEEDLES) 29G X 12MM MISC 8 Units by Does not apply route daily. 04/27/14   Allie Bossier, MD  ranitidine (ZANTAC) 150 MG tablet Take 1 tablet (150 mg total) by mouth 2 (two) times daily. 01/07/15   Consuella Lose, MD     Physical Exam: Filed Vitals:   07/13/15 1751  BP: 136/78  Pulse: 100  Temp: 97.6 F (36.4 C)  TempSrc:  Oral  Resp: 20  SpO2: 98%    Constitutional: Vital signs reviewed. Patient is a well-developed and well-nourished in no acute distress and cooperative with exam. Alert and oriented x3.  Head: Normocephalic and atraumatic  Ear: TM normal bilaterally  Mouth: no erythema or exudates, MMM  Eyes: PERRL, EOMI, conjunctivae normal, No scleral icterus.  Neck: Supple, Trachea midline normal ROM, No JVD, mass, thyromegaly, or carotid bruit present.  Cardiovascular: RRR, S1 normal, S2 normal, no MRG, pulses symmetric and intact bilaterally  Pulmonary/Chest: CTAB, no wheezes, rales, or rhonchi  Abdominal: Soft. Non-tender, non-distended, bowel sounds are normal, no masses, organomegaly, or guarding present.  GU: no CVA tenderness Musculoskeletal: No joint deformities, erythema, or stiffness, ROM full and no nontender Ext: Patient with left middle digit wrapped following surgical procedure. no cyanosis, pulses palpable bilaterally (DP and PT)  Hematology: no cervical, inginal, or axillary adenopathy.  Neurological: A&O x3, Strenght is normal and symmetric bilaterally, cranial nerve II-XII are grossly intact, no focal motor deficit, sensory intact to light touch bilaterally.  Skin: Warm, dry and intact. No rash, cyanosis, or clubbing.  Psychiatric: Normal mood and affect. speech and behavior is normal. Judgment and thought content normal. Cognition and memory are normal.      Data Review   Micro Results No results found for this or any previous visit (from the past 240 hour(s)).  Radiology Reports Dg Finger Middle Left  07/13/2015  CLINICAL DATA:  INFECTION in left middle finger. Pt. Nicked the proximal region of the middle phalanx of the left middle finger with a steak knife protruding from dishwasher about 2 weeks ago. On round 2 of antibiotics and finger not getting better. Swelling and pain from this region through distal phalanx. Hx of diabetes mellitus. EXAM: LEFT MIDDLE FINGER 2+V  COMPARISON:  None. FINDINGS: No fracture or dislocation. There is bone resorption along the distal aspect of the middle phalanx of the middle finger and the palmar base of the distal phalanx. There is associated focal osteopenia. There is narrowing of the DIP joint space and surrounding soft tissue swelling. No soft tissue air. The PIP and MCP joints are unremarkable. IMPRESSION: 1. Osteomyelitis involving the distal aspect of the middle phalanx of the left middle finger and the base of the distal phalanx. The changes lie  on each side of the DIP joint concerning for septic arthritis. There is diffuse surrounding soft tissue swelling. Electronically Signed   By: Amie Portland M.D.   On: 07/13/2015 19:51     CBC  Recent Labs Lab 07/13/15 1814  WBC 6.4  HGB 12.9*  HCT 39.4  PLT 144*  MCV 82.3  MCH 26.9  MCHC 32.7  RDW 14.2  LYMPHSABS 1.2  MONOABS 0.4  EOSABS 0.4  BASOSABS 0.0    Chemistries   Recent Labs Lab 07/13/15 1814  NA 140  K 4.0  CL 108  CO2 26  GLUCOSE 99  BUN 23*  CREATININE 1.52*  CALCIUM 9.0  AST 27  ALT 40  ALKPHOS 128*  BILITOT 0.4   ------------------------------------------------------------------------------------------------------------------ CrCl cannot be calculated (Unknown ideal weight.). ------------------------------------------------------------------------------------------------------------------ No results for input(s): HGBA1C in the last 72 hours. ------------------------------------------------------------------------------------------------------------------ No results for input(s): CHOL, HDL, LDLCALC, TRIG, CHOLHDL, LDLDIRECT in the last 72 hours. ------------------------------------------------------------------------------------------------------------------ No results for input(s): TSH, T4TOTAL, T3FREE, THYROIDAB in the last 72 hours.  Invalid input(s):  FREET3 ------------------------------------------------------------------------------------------------------------------ No results for input(s): VITAMINB12, FOLATE, FERRITIN, TIBC, IRON, RETICCTPCT in the last 72 hours.  Coagulation profile No results for input(s): INR, PROTIME in the last 168 hours.  No results for input(s): DDIMER in the last 72 hours.  Cardiac Enzymes No results for input(s): CKMB, TROPONINI, MYOGLOBIN in the last 168 hours.  Invalid input(s): CK ------------------------------------------------------------------------------------------------------------------ Invalid input(s): POCBNP   CBG: No results for input(s): GLUCAP in the last 168 hours.        Assessment/Plan Principal Problem:  Suspected Finger osteomyelitis, left middle finger: Acute. Patient notes a two-week history of worsening swelling and redness without improvement with antibiotics of Augmentin and Keflex. X-ray imaging showing possible signs of osteomyelitis for which the patient was taken by Dr. Izora Ribas for surgery for further evaluation. - Continue vancomycin to be dosed per pharmacy - Follow-up surgical cultures - Checking blood cultures, ESR, CRP, uric acid level - Will need to determine if PICC line necessary in am as following procedure patient reports that he was told that it looked like gout by Dr. Izora Ribas  Type 2 diabetes mellitus (HCC). Per patient history appears to be well controlled as he reports his last hemoglobin A1c 6.9 approximately 1 month ago. -Held oral antihypoglycemic agents -CBGs  before meals and at bedtime   -Sensitive sliding scale insulin     CKD stage 3 due to type 2 diabetes mellitus (HCC) stable. Patient's baseline creatinine is somewhere around 1.5 as of lab work taken from 06/2015. It appears patient has been on lisinopril for some time so this medication was continued    Essential hypertension, benign: Well controlled  -Continue home meds of amlodipine,  metoprolol, hydralazine, and lisinopril  Chronic systolic CHF (congestive heart failure) (HCC) Appears to be stable. Last documented echocardiogram from 04/22/2014 shows a ejection fraction of 30-35%. -Continue meds as seen above    HLD (hyperlipidemia) -Continue  home medications of atorvastatin  S/P mitral valve repair: stable    S/P tricuspid valve repair: stable  OSA (obstructive sleep apnea) patient denies using CPAP at night  Consults Dr. Izora Ribas orthopedic surgery Code Status:   full Family Communication: bedside Disposition Plan: admit   Total time spent 55 minutes.Greater than 50% of this time was spent in counseling, explanation of diagnosis, planning of further management, and coordination of care  Clydie Braun [facility] Pager [phone]  If 7PM-7AM, please contact night-coverage [email or web address] Password [email or web address] 07/13/2015, 11:06 PM

## 2015-07-13 NOTE — ED Notes (Signed)
Pt speaking to wife on phone, to be taken to [place] for surgery.

## 2015-07-13 NOTE — ED Notes (Signed)
Pt reports L middle finger swelling and pain. Reports he accidentally punctured finger several weeks ago with steak knife. Pt reports L middle finger feels tight. Red and painful to touch upon arrival to ED. Able to wiggle digit, capillary refill less than 3 seconds.

## 2015-07-13 NOTE — ED Notes (Signed)
The patient think he stabbed himself with a steak knife three weeks ago.  He says he was reaching in the dishwasher and stabbed it then.  It is his left, middle finger.  The patient said he has been treated with antibiotics and it is getting worse.  The patient is a diabetic and has anemia.  He rates his pain 10/10.

## 2015-07-14 ENCOUNTER — Encounter (HOSPITAL_COMMUNITY): Payer: Self-pay | Admitting: General Practice

## 2015-07-14 DIAGNOSIS — R3915 Urgency of urination: Secondary | ICD-10-CM | POA: Diagnosis present

## 2015-07-14 DIAGNOSIS — Z888 Allergy status to other drugs, medicaments and biological substances status: Secondary | ICD-10-CM | POA: Diagnosis not present

## 2015-07-14 DIAGNOSIS — Z8673 Personal history of transient ischemic attack (TIA), and cerebral infarction without residual deficits: Secondary | ICD-10-CM | POA: Diagnosis not present

## 2015-07-14 DIAGNOSIS — M869 Osteomyelitis, unspecified: Secondary | ICD-10-CM | POA: Diagnosis not present

## 2015-07-14 DIAGNOSIS — E785 Hyperlipidemia, unspecified: Secondary | ICD-10-CM | POA: Diagnosis present

## 2015-07-14 DIAGNOSIS — I130 Hypertensive heart and chronic kidney disease with heart failure and stage 1 through stage 4 chronic kidney disease, or unspecified chronic kidney disease: Secondary | ICD-10-CM | POA: Diagnosis present

## 2015-07-14 DIAGNOSIS — Z794 Long term (current) use of insulin: Secondary | ICD-10-CM | POA: Diagnosis not present

## 2015-07-14 DIAGNOSIS — D509 Iron deficiency anemia, unspecified: Secondary | ICD-10-CM | POA: Diagnosis present

## 2015-07-14 DIAGNOSIS — K219 Gastro-esophageal reflux disease without esophagitis: Secondary | ICD-10-CM | POA: Diagnosis present

## 2015-07-14 DIAGNOSIS — I671 Cerebral aneurysm, nonruptured: Secondary | ICD-10-CM | POA: Diagnosis present

## 2015-07-14 DIAGNOSIS — E1169 Type 2 diabetes mellitus with other specified complication: Secondary | ICD-10-CM | POA: Diagnosis present

## 2015-07-14 DIAGNOSIS — Z87891 Personal history of nicotine dependence: Secondary | ICD-10-CM | POA: Diagnosis not present

## 2015-07-14 DIAGNOSIS — M1A9XX1 Chronic gout, unspecified, with tophus (tophi): Secondary | ICD-10-CM | POA: Diagnosis present

## 2015-07-14 DIAGNOSIS — M86142 Other acute osteomyelitis, left hand: Secondary | ICD-10-CM

## 2015-07-14 DIAGNOSIS — I5022 Chronic systolic (congestive) heart failure: Secondary | ICD-10-CM | POA: Diagnosis present

## 2015-07-14 DIAGNOSIS — N401 Enlarged prostate with lower urinary tract symptoms: Secondary | ICD-10-CM | POA: Diagnosis present

## 2015-07-14 DIAGNOSIS — F411 Generalized anxiety disorder: Secondary | ICD-10-CM | POA: Diagnosis present

## 2015-07-14 DIAGNOSIS — F4312 Post-traumatic stress disorder, chronic: Secondary | ICD-10-CM | POA: Diagnosis present

## 2015-07-14 DIAGNOSIS — N183 Chronic kidney disease, stage 3 (moderate): Secondary | ICD-10-CM | POA: Diagnosis present

## 2015-07-14 DIAGNOSIS — G4733 Obstructive sleep apnea (adult) (pediatric): Secondary | ICD-10-CM | POA: Diagnosis present

## 2015-07-14 DIAGNOSIS — E1122 Type 2 diabetes mellitus with diabetic chronic kidney disease: Secondary | ICD-10-CM | POA: Diagnosis present

## 2015-07-14 HISTORY — DX: Other acute osteomyelitis, left hand: M86.142

## 2015-07-14 LAB — BASIC METABOLIC PANEL
ANION GAP: 5 (ref 5–15)
BUN: 22 mg/dL — ABNORMAL HIGH (ref 6–20)
CHLORIDE: 105 mmol/L (ref 101–111)
CO2: 26 mmol/L (ref 22–32)
CREATININE: 1.44 mg/dL — AB (ref 0.61–1.24)
Calcium: 8.6 mg/dL — ABNORMAL LOW (ref 8.9–10.3)
GFR calc non Af Amer: 48 mL/min — ABNORMAL LOW (ref >60)
GFR, EST AFRICAN AMERICAN: 56 mL/min — AB (ref >60)
Glucose, Bld: 204 mg/dL — ABNORMAL HIGH (ref 65–99)
POTASSIUM: 3.9 mmol/L (ref 3.5–5.1)
SODIUM: 136 mmol/L (ref 135–145)

## 2015-07-14 LAB — CBC
HEMATOCRIT: 35.4 % — AB (ref 39.0–52.0)
HEMOGLOBIN: 11.9 g/dL — AB (ref 13.0–17.0)
MCH: 27.9 pg (ref 26.0–34.0)
MCHC: 33.6 g/dL (ref 30.0–36.0)
MCV: 82.9 fL (ref 78.0–100.0)
Platelets: 122 10*3/uL — ABNORMAL LOW (ref 150–400)
RBC: 4.27 MIL/uL (ref 4.22–5.81)
RDW: 14.6 % (ref 11.5–15.5)
WBC: 6.3 10*3/uL (ref 4.0–10.5)

## 2015-07-14 LAB — GLUCOSE, CAPILLARY
GLUCOSE-CAPILLARY: 138 mg/dL — AB (ref 65–99)
GLUCOSE-CAPILLARY: 176 mg/dL — AB (ref 65–99)

## 2015-07-14 LAB — URIC ACID: Uric Acid, Serum: 11.6 mg/dL — ABNORMAL HIGH (ref 4.4–7.6)

## 2015-07-14 MED ORDER — ONDANSETRON HCL 4 MG PO TABS: 4.0000 mg | ORAL_TABLET | Freq: Four times a day (QID) | ORAL | Status: DC | PRN

## 2015-07-14 MED ORDER — DIPHENHYDRAMINE HCL 25 MG PO CAPS
25.0000 mg | ORAL_CAPSULE | ORAL | Status: DC | PRN
  Filled 2015-07-14: qty 1

## 2015-07-14 MED ORDER — TAMSULOSIN HCL 0.4 MG PO CAPS
0.4000 mg | ORAL_CAPSULE | Freq: Two times a day (BID) | ORAL | Status: DC
  Administered 2015-07-14: 0.4 mg via ORAL
  Filled 2015-07-14: qty 1

## 2015-07-14 MED ORDER — ENOXAPARIN SODIUM 40 MG/0.4ML ~~LOC~~ SOLN
40.0000 mg | SUBCUTANEOUS | Status: DC
  Administered 2015-07-14: 40 mg via SUBCUTANEOUS
  Filled 2015-07-14: qty 0.4

## 2015-07-14 MED ORDER — COLCHICINE 0.6 MG PO TABS: 0.6000 mg | ORAL_TABLET | Freq: Every day | ORAL | Status: DC

## 2015-07-14 MED ORDER — MORPHINE SULFATE (PF) 2 MG/ML IV SOLN
2.0000 mg | INTRAVENOUS | Status: DC | PRN
  Administered 2015-07-14 (×3): 2 mg via INTRAVENOUS
  Filled 2015-07-14 (×3): qty 1

## 2015-07-14 MED ORDER — CITALOPRAM HYDROBROMIDE 20 MG PO TABS
20.0000 mg | ORAL_TABLET | Freq: Every day | ORAL | Status: DC
  Administered 2015-07-14: 20 mg via ORAL
  Filled 2015-07-14: qty 1

## 2015-07-14 MED ORDER — DOXYCYCLINE HYCLATE 100 MG PO TABS: 100.0000 mg | ORAL_TABLET | Freq: Two times a day (BID) | ORAL | Status: AC

## 2015-07-14 MED ORDER — VITAMIN D (ERGOCALCIFEROL) 1.25 MG (50000 UNIT) PO CAPS: 50000.0000 [IU] | ORAL_CAPSULE | ORAL | Status: DC

## 2015-07-14 MED ORDER — INSULIN ASPART 100 UNIT/ML ~~LOC~~ SOLN
0.0000 [IU] | Freq: Three times a day (TID) | SUBCUTANEOUS | Status: DC
  Administered 2015-07-14: 1 [IU] via SUBCUTANEOUS
  Administered 2015-07-14: 2 [IU] via SUBCUTANEOUS

## 2015-07-14 MED ORDER — ATORVASTATIN CALCIUM 10 MG PO TABS
10.0000 mg | ORAL_TABLET | Freq: Every day | ORAL | Status: DC
  Administered 2015-07-14: 10 mg via ORAL
  Filled 2015-07-14: qty 1

## 2015-07-14 MED ORDER — ASPIRIN EC 325 MG PO TBEC
325.0000 mg | DELAYED_RELEASE_TABLET | Freq: Every day | ORAL | Status: DC
  Administered 2015-07-14: 325 mg via ORAL
  Filled 2015-07-14: qty 1

## 2015-07-14 MED ORDER — POLYETHYLENE GLYCOL 3350 17 G PO PACK: 17.0000 g | PACK | Freq: Every day | ORAL | Status: DC | PRN

## 2015-07-14 MED ORDER — METOPROLOL SUCCINATE ER 25 MG PO TB24
25.0000 mg | ORAL_TABLET | Freq: Two times a day (BID) | ORAL | Status: DC
  Administered 2015-07-14 (×2): 25 mg via ORAL
  Filled 2015-07-14 (×2): qty 1

## 2015-07-14 MED ORDER — ONDANSETRON HCL 4 MG/2ML IJ SOLN: 4.0000 mg | Freq: Four times a day (QID) | INTRAMUSCULAR | Status: DC | PRN

## 2015-07-14 MED ORDER — AMLODIPINE BESYLATE 10 MG PO TABS
10.0000 mg | ORAL_TABLET | Freq: Every day | ORAL | Status: DC
  Administered 2015-07-14: 10 mg via ORAL
  Filled 2015-07-14: qty 1

## 2015-07-14 MED ORDER — HYDROCODONE-ACETAMINOPHEN 5-325 MG PO TABS
1.0000 | ORAL_TABLET | ORAL | Status: DC | PRN
  Administered 2015-07-14 (×2): 1 via ORAL
  Filled 2015-07-14 (×2): qty 1

## 2015-07-14 MED ORDER — ENOXAPARIN SODIUM 60 MG/0.6ML ~~LOC~~ SOLN: 50.0000 mg | SUBCUTANEOUS | Status: DC

## 2015-07-14 MED ORDER — VITAMIN B-12 1000 MCG PO TABS
1000.0000 ug | ORAL_TABLET | Freq: Every day | ORAL | Status: DC
  Administered 2015-07-14: 1000 ug via ORAL
  Filled 2015-07-14: qty 1

## 2015-07-14 MED ORDER — FUROSEMIDE 40 MG PO TABS
40.0000 mg | ORAL_TABLET | Freq: Every day | ORAL | Status: DC
  Administered 2015-07-14: 40 mg via ORAL
  Filled 2015-07-14: qty 1

## 2015-07-14 MED ORDER — DIPHENHYDRAMINE HCL 25 MG PO CAPS
50.0000 mg | ORAL_CAPSULE | Freq: Once | ORAL | Status: AC
  Administered 2015-07-14: 50 mg via ORAL
  Filled 2015-07-14: qty 2

## 2015-07-14 MED ORDER — AMOXICILLIN-POT CLAVULANATE 875-125 MG PO TABS: 1.0000 | ORAL_TABLET | Freq: Two times a day (BID) | ORAL | Status: AC

## 2015-07-14 MED ORDER — PANTOPRAZOLE SODIUM 40 MG PO TBEC
40.0000 mg | DELAYED_RELEASE_TABLET | Freq: Every day | ORAL | Status: DC
  Administered 2015-07-14: 40 mg via ORAL
  Filled 2015-07-14: qty 1

## 2015-07-14 MED ORDER — OXYCODONE-ACETAMINOPHEN 5-325 MG PO TABS: 1.0000 | ORAL_TABLET | Freq: Four times a day (QID) | ORAL | Status: DC | PRN

## 2015-07-14 MED ORDER — INSULIN GLARGINE 100 UNIT/ML ~~LOC~~ SOLN
8.0000 [IU] | Freq: Every day | SUBCUTANEOUS | Status: DC
  Filled 2015-07-14: qty 0.08

## 2015-07-14 MED ORDER — HYDRALAZINE HCL 10 MG PO TABS
10.0000 mg | ORAL_TABLET | Freq: Three times a day (TID) | ORAL | Status: DC
  Administered 2015-07-14: 10 mg via ORAL
  Filled 2015-07-14: qty 1

## 2015-07-14 NOTE — Anesthesia Postprocedure Evaluation (Signed)
Anesthesia Post Note  Patient: Barry Haley  Procedure(s) Performed: Procedure(s) (LRB): IRRIGATION AND DEBRIDEMENT LEFT LONG FINGER (Left)  Anesthesia type: MAC  Patient location: PACU  Post pain: Pain level controlled  Post assessment: Post-op Vital signs reviewed  Last Vitals: BP 138/64 mmHg  Pulse 67  Temp(Src) 36.5 C (Oral)  Resp 18  Ht 5\' 10"  (1.778 m)  Wt 227 lb 15.3 oz (103.4 kg)  BMI 32.71 kg/m2  SpO2 98%  Post vital signs: Reviewed  Level of consciousness: awake  Complications: No apparent anesthesia complications

## 2015-07-14 NOTE — Care Management Note (Addendum)
Case Management Note  Patient Details  Name: Barry Haley MRN: [id number] Date of Birth: 01/16/1946  Subjective/Objective:                  Date-07-14-15 Initial Assessment Spoke with patient at the bedside.  Introduced self as Tourist information centre manager and explained role in discharge planning and how to be reached.  Verified patient lives at [place] [id number].  Verified patient anticipates to go home with spouse, at time of discharge.  Patient has DME CPAP through [facility]. Expressed potential need for no other DME.  Patient denied needing help with their medication.  Patient drives, or is driven by wife to MD appointments.  Verified patient has PCP Dr Lin Landsman Patient states they currently receive [facility] services through no one.  Patient was provided choice and selected [facility] for home health needs if they arise.  Plan: CM will continue to follow for discharge planning and [facility] resources.   Carles Collet RN BSN CM [phone]   Action/Plan:  Referral made to [facility] for [facility] RN and infusion services. [facility] will follow for home IV needs as DC plan is made. Addendum: [facility] discharged on PO abx, no HH needed.  Expected Discharge Date:                  Expected Discharge Plan:  Home/Self Care  In-House Referral:     Discharge planning Services  CM Consult  Post Acute Care Choice:    Choice offered to:     DME Arranged:    DME Agency:     HH Arranged:    HH Agency:     Status of Service:  In process, will continue to follow  Medicare Important Message Given:    Date Medicare IM Given:    Medicare IM give by:    Date Additional Medicare IM Given:    Additional Medicare Important Message give by:     If discussed at [place] of Stay Meetings, dates discussed:    Additional Comments:  Carles Collet, RN 07/14/2015, 11:01 AM

## 2015-07-14 NOTE — Progress Notes (Signed)
S:  My finger hurts  O:Blood pressure 138/64, pulse 67, temperature 97.7 F (36.5 C), temperature source Oral, resp. rate 18, height 5\' 10"  (1.778 m), weight 103.4 kg (227 lb 15.3 oz), SpO2 98 %.  LLF, finger with less erythema, mod swelling, skin flaps viable  A:s/p I&D LLF   P: ? Gout as uric acid elevated vs infectious, will f/u on cultures and treat accordingly. Dressing changes, wound care discussed with patient

## 2015-07-14 NOTE — Op Note (Signed)
NAMELAWSON, HOGREFE NO.:  [id number]  MEDICAL RECORD NO.:  [id number]  LOCATION:  5W10C                        FACILITY:  [facility]  PHYSICIAN:  Dennie Bible, MD    DATE OF BIRTH:  03/06/1946  DATE OF PROCEDURE:  07/13/2015 DATE OF DISCHARGE:                              OPERATIVE REPORT   PREOPERATIVE DIAGNOSIS:  Presumed osteomyelitis of the left long finger distal interphalangeal joint.  POSTOPERATIVE DIAGNOSIS:  Presumed osteomyelitis of the left long finger distal interphalangeal joint.  PROCEDURE:  Incision and drainage, and debridement of extensor tendon, bone, and cartilage of the distal interphalangeal joint.  Cultures were taken.  ANESTHESIA:  Local with sedation.  ESTIMATED BLOOD LOSS:  Minimal.  COMPLICATIONS:  No acute complications.  SPECIMENS:  There was a tissue specimen sent for both culture and crystal evaluation and then there were separate culture swabs sent for Gram stain and cultures.  INDICATIONS:  Mr. Hermoso is 69 year old gentleman who has been troubled for approximately 2 weeks now with a swollen and red left long finger.  He originally states that a steak knife punctured the dorsal aspect of his finger, after that the finger became red and swollen.  He presented to his primary care physician.  He has been placed on 2 different courses of antibiotics.  The finger is better; however remains red, swollen mostly over the DIP joint.  X-ray examination revealed some moth-eaten type appearance of the bone suggested of osteomyelitis and I was consulted.  Risks, benefits, and alternatives of surgical debridement were discussed with the patient including surgical debridement and long-term antibiotics. He agreed with the treatment plan.  Consent was obtained.  PROCEDURE IN DETAIL:  Patient was taken to the operating room, placed supine on the operating room table.  A time-out was performed identifying the correct finger and  procedure.  Anesthesia was administered.  Local anesthetic was infiltrated around the base of the long finger circumferentially for a nice regional finger block, this was performed with plain lidocaine mixed with plain 0.25% Marcaine.  Next, an Esmarch was used around the wrist for tourniquet and an "H" type incision was made overlying the DIP joint.  Skin flaps were raised both proximally and distally exposing the extensor tendon and it's distal insertion.  A small incision was made on each side of this extensor tendon.  The tendon was elevated.  Immediately, there was some very whitish almost chalky-appearing material that was seen in the soft tissues adjacent to the tendon and at the DIP joint, questionable whether this was subacute type infectious material or whether this was actually a gouty material, this tissue was cultured and some of the tissue was placed in saline for tissue analysis.  Next, irrigation of the DIP joint was performed for about 1 L.  A curette was used to scrape the soft tissues and actually the bone until nice edge was found. Thorough irrigation of this with 2 additional L for a total of 3 L of saline was performed.  Hemostasis was obtained with bipolar.  The incision was loosely approximated with 5-0 nylon and a Xeroform sterile dressing and a finger splint were applied. Patient tolerated procedure well, awakened from  anesthesia without difficulty.     Dennie Bible, MD     HCC/MEDQ  D:  07/13/2015  T:  07/14/2015  Job:  [id number]

## 2015-07-14 NOTE — Discharge Summary (Addendum)
Barry Haley, is a 69 y.o. male  DOB [birth year]  MRN [id number].  Admission date:  07/13/2015  Admitting Physician  Norval Morton, MD  Discharge Date:  07/14/2015   Primary MD  [facility] Angelique Blonder., MD  Recommendations for primary care physician for things to follow:  - Patient to follow with hand surgery next week Dr.Harill Coley regarding final results of the drain cultures and wound check.   Admission Diagnosis  Finger osteomyelitis, left (HCC) [M86.9]   Discharge Diagnosis  Finger osteomyelitis, left (HCC) [M86.9]  Principal Problem:   Finger osteomyelitis, left (HCC) Active Problems:   S/P mitral valve repair   S/P tricuspid valve repair   Chronic systolic CHF (congestive heart failure) (HCC)   OSA (obstructive sleep apnea)   CKD stage 3 due to type 2 diabetes mellitus (HCC)   Essential hypertension, benign   HLD (hyperlipidemia)   Type 2 diabetes mellitus (HCC)   Osteomyelitis of hand, left, acute ([place])      Past Medical History  Diagnosis Date  . Hypertension   . Hyperlipidemia   . Cerebral aneurysm, nonruptured   . Epistaxis   . Anxiety state, unspecified   . Unspecified cerebral artery occlusion with cerebral infarction   . Other shock without mention of trauma   . Personal history of other disorder of urinary system   . Personal history of other diseases of digestive system   . Hx of migraines   . Meningitis 2010  . Sleep apnea     does not wear his cpap  . Diabetes ([place])     IDDm x 3 years  . Chronic post-traumatic stress disorder (PTSD)   . Cataracts, bilateral   . Shortness of breath dyspnea   . Anemia 2016    evaluated by Dr Hinton Rao @ [facility] [place] [facility]  . Anemia, iron deficiency   . H/O epistaxis   . Anterior cerebral aneurysm 2016  . Neuropathy ([place])   . Seborrheic dermatitis   . GERD (gastroesophageal reflux disease)   . History of hiatal hernia   .  Benign prostatic hyperplasia (BPH) with urinary urgency   . Nocturia more than twice per night   . High cholesterol   . Difficult intubation 01/2013    /[facility] 01/27/13: anterior cords; unsuccessful with MAC4 and MIL3, successful with #5 Glidescope  . CKD (chronic kidney disease)     Dr. Justin Mend, [facility]  . Aneurysm ([place]) 01/06/15    brain    Past Surgical History  Procedure Laterality Date  . Appendectomy      child  . 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 Mitral valve reconstruction with cardiopulmonary bypass  01/27/2013  . Tee without cardioversion N/A 04/22/2014    Procedure: TRANSESOPHAGEAL ECHOCARDIOGRAM (TEE);  Surgeon: Thayer Headings, MD;  Location: [place];  Service: Cardiovascular;  Laterality: N/A;  . Radiology with anesthesia N/A 04/23/2014    Procedure: RADIOLOGY WITH ANESTHESIA;  Surgeon: Medication Radiologist, MD;  Location: [place];  Service: Radiology;  Laterality: N/A;  . Radiology with anesthesia N/A 01/06/2015    Procedure: Pipeline Embolization;  Surgeon: Consuella Lose, MD;  Location: [place];  Service: Radiology;  Laterality: N/A;  . Infected skin debridement Left 07/13/2015    left long finger  . I&d extremity Left 07/13/2015    Procedure: IRRIGATION AND DEBRIDEMENT LEFT LONG FINGER;  Surgeon: Dayna Barker, MD;  Location: [place];  Service: Plastics;  Laterality: Left;       History of present illness and  Hospital Course:     Kindly see H&P for history of present illness and admission details, please review complete Labs, Consult reports and Test reports for all details in brief  HPI  from the history and physical done on the day of admission 11/9  Barry Haley is a 69 year old male with past medical history HTN, HLD, IDDM, CKD, iron deficiency anemia, OSA not on CPAP, PTSD, cerebral aneurysm,s/p MV and TV repair; who presents with complaints of pain and swelling his left middle finger after sustaining a cut to it on a steak  knife while emptying the dishwasher 2 weeks ago. Patient notes that he initially went to his primary care provider regarding the cut. His PCP, Dr. Lin Landsman placed him on antibiotics Augmentin for approximately 10 days and then Keflex for approximately 5 days. However, the finger did not improve and he noted progressively worsening redness, swelling, and moderate pain despite antibiotic use. At that time it was advised that he see a hand specialist, but it was going to take approximately 2 weeks to get into the office and therefore he was advised to proceed to the emergency department for further evaluation. Patient notes multiple other medical problems that have all been relatively stable including his insulin-dependent diabetes. Patient reports that his last hemoglobin A1c was approximately 1 month ago on 6.9. Otherwise he notes that he had a cerebral angiogram about 2 weeks ago to reevaluate a RICA aneurysm after he had underwent Pipeline embolization earlier in the year. He states that everything was fine with the procedure and he said no complications. Note his baseline creatinine is somewhere around 1.51.  After the patient was seen in the emergency department x-rays were obtained and showed signs of osteomyelitis involving the distal aspect of the middle phalanx of the left finger. Patient was evaluated by Dr. Lenon Curt of orthopedics and it was determined to take the patient to surgery for further evaluation.     Hospital Course   Left middle finger infection versus gout - Patient presents with left finger erythema, swelling and tenderness, and surgery consult greatly appreciated, initial imaging showing evidence of osteomyelitis, status post incision and drainage and debridement of extensor tendon, bone and cartilage of the distal interphalangeal joint, cultures were sent, no organisms seen on Gram stain, patient had elevated uric acid, discussed with hand surgery Dr. Lenon Curt, who felt patient  presentation most likely related to gouty arthritis, given findings in OR,  tophi  like material, and elevated uric acid, where a  trial of colchicine should be started, so we'll start him on colchicine 1 tablet oral daily , he would be able to follow on final culture results as an outpatient and make further determination about management, will discharge on doxycycline/Augmentin for next 10 days pending final culture results and hand surgery follow-up as an outpatient.   diabetes mellitus - Continue home medication on discharge  CK D stage III - Appears to be stable, around baseline   Hypertension - Continue with home medication  Chronic systolic CHF - Continue with home medication  Discharge Condition:  Stable   Follow UP  Follow-up Information    Follow up with Rayvon Char, MD. Call in 1 week.   Specialty:  General Surgery   Why:  Wound Check, and culture results check.   Contact information:   [place] [id number] [phone]         Discharge Instructions  and  Discharge Medications     Discharge Instructions    Diet - low sodium heart healthy    Complete by:  As directed      Discharge instructions    Complete by:  As directed   Follow with Primary MD REDDING Angelique Blonder., MD in 7 days   Get CBC, CMP, 2 view Chest X ray checked  by Primary MD next visit.    Activity: As tolerated with Full fall precautions use walker/cane & assistance as needed   Disposition Home    Diet: Heart Healthy , carbohydrate modified , with feeding assistance and aspiration precautions.  For Heart failure patients - Check your Weight same time everyday, if you gain over 2 pounds, or you develop in leg swelling, experience more shortness of breath or chest pain, call your Primary MD immediately. Follow Cardiac Low Salt Diet and 1.5 lit/day fluid restriction.   On your next visit with your primary care physician please Get Medicines  reviewed and adjusted.   Please request your Prim.MD to go over all Hospital Tests and Procedure/Radiological results at the follow up, please get all Hospital records sent to your Prim MD by signing hospital release before you go home.   If you experience worsening of your admission symptoms, develop shortness of breath, life threatening emergency, suicidal or homicidal thoughts you must seek medical attention immediately by calling 911 or calling your MD immediately  if symptoms less severe.  You Must read complete instructions/literature along with all the possible adverse reactions/side effects for all the Medicines you take and that have been prescribed to you. Take any new Medicines after you have completely understood and accpet all the possible adverse reactions/side effects.   Do not drive, operating heavy machinery, perform activities at heights, swimming or participation in water activities or provide baby sitting services if your were admitted for syncope or siezures until you have seen by Primary MD or a Neurologist and advised to do so again.  Do not drive when taking Pain medications.    Do not take more than prescribed Pain, Sleep and Anxiety Medications  Special Instructions: If you have smoked or chewed Tobacco  in the last 2 yrs please stop smoking, stop any regular Alcohol  and or any Recreational drug use.  Wear Seat belts while driving.   Please note  You were cared for by a hospitalist during your hospital stay. If you have any questions about your discharge medications or the care you received while you were in the hospital after you are discharged, you can call the unit and asked to speak with the hospitalist on call if the hospitalist that took care of you is not available. Once you are discharged, your primary care physician will handle any further medical issues. Please note that NO REFILLS for any discharge medications will be authorized once you are discharged,  as it is imperative that you return to your primary care physician (or establish a relationship with a primary care physician if you do not have one) for your  aftercare needs so that they can reassess your need for medications and monitor your lab values.     Increase activity slowly    Complete by:  As directed             Medication List    STOP taking these medications        cephALEXin 500 MG capsule  Commonly known as:  KEFLEX      TAKE these medications        amLODipine 10 MG tablet  Commonly known as:  NORVASC  Take 1 tablet (10 mg total) by mouth daily.     amoxicillin-clavulanate 875-125 MG tablet  Commonly known as:  AUGMENTIN  Take 1 tablet by mouth 2 (two) times daily.     aspirin EC 325 MG tablet  Take 1 tablet (325 mg total) by mouth daily.     atorvastatin 10 MG tablet  Commonly known as:  LIPITOR  Take 10 mg by mouth at bedtime.     citalopram 20 MG tablet  Commonly known as:  CELEXA  Take 20 mg by mouth daily.     colchicine 0.6 MG tablet  Take 1 tablet (0.6 mg total) by mouth daily.     doxycycline 100 MG tablet  Commonly known as:  VIBRA-TABS  Take 1 tablet (100 mg total) by mouth 2 (two) times daily.     ergocalciferol 50000 UNITS capsule  Commonly known as:  VITAMIN D2  Take 50,000 Units by mouth every 30 (thirty) days. No specific day     esomeprazole 40 MG capsule  Commonly known as:  NEXIUM  Take 40 mg by mouth daily at 12 noon.     FISH OIL PO  Take 1 capsule by mouth daily.     furosemide 40 MG tablet  Commonly known as:  LASIX  Take 40 mg by mouth daily.     glimepiride 2 MG tablet  Commonly known as:  AMARYL  Take 2 mg by mouth daily with breakfast.     HEMOCYTE PLUS PO  Take 1 tablet by mouth daily.     hydrALAZINE 10 MG tablet  Commonly known as:  APRESOLINE  Take 10 mg by mouth 3 (three) times daily.     Insulin Glargine 100 UNIT/ML Solostar Pen  Commonly known as:  LANTUS SOLOSTAR  Inject 8 Units into the skin  daily at 10 pm.     Insulin Pen Needle 29G X 12MM Misc  Commonly known as:  AURORA PEN NEEDLES  8 Units by Does not apply route daily.     metoprolol succinate 50 MG 24 hr tablet  Commonly known as:  TOPROL-XL  Take 25 mg by mouth 2 (two) times daily. Take with or immediately following a meal.     oxyCODONE-acetaminophen 5-325 MG tablet  Commonly known as:  PERCOCET  Take 1 tablet by mouth every 6 (six) hours as needed for severe pain.     ranitidine 150 MG tablet  Commonly known as:  ZANTAC  Take 1 tablet (150 mg total) by mouth 2 (two) times daily.     tamsulosin 0.4 MG Caps capsule  Commonly known as:  FLOMAX  Take 0.4 mg by mouth 2 (two) times daily.     vitamin B-12 1000 MCG tablet  Commonly known as:  CYANOCOBALAMIN  Take 1,000 mcg by mouth daily.          Diet and Activity recommendation: See Discharge Instructions above   Consults obtained -  Hand surgery   Major procedures and Radiology Reports - PLEASE review detailed and final reports for all details, in brief -   06/12/2015 Incision and drainage, and debridement of extensor tendon,bone, and cartilage of the distal interphalangeal joint. Cultures were taken.  Dg Finger Middle Left  07/13/2015  CLINICAL DATA:  INFECTION in left middle finger. Pt. Nicked the proximal region of the middle phalanx of the left middle finger with a steak knife protruding from dishwasher about 2 weeks ago. On round 2 of antibiotics and finger not getting better. Swelling and pain from this region through distal phalanx. Hx of diabetes mellitus. EXAM: LEFT MIDDLE FINGER 2+V COMPARISON:  None. FINDINGS: No fracture or dislocation. There is bone resorption along the distal aspect of the middle phalanx of the middle finger and the palmar base of the distal phalanx. There is associated focal osteopenia. There is narrowing of the DIP joint space and surrounding soft tissue swelling. No soft tissue air. The PIP and MCP joints are  unremarkable. IMPRESSION: 1. Osteomyelitis involving the distal aspect of the middle phalanx of the left middle finger and the base of the distal phalanx. The changes lie on each side of the DIP joint concerning for septic arthritis. There is diffuse surrounding soft tissue swelling. Electronically Signed   By: Lajean Manes M.D.   On: 07/13/2015 19:51    Micro Results     Recent Results (from the past 240 hour(s))  Anaerobic culture     Status: None (Preliminary result)   Collection Time: 07/13/15 10:47 PM  Result Value Ref Range Status   Specimen Description ABSCESS LEFT FINGER  Final   Special Requests NONE  Final   Gram Stain   Final    FEW WBC PRESENT,BOTH PMN AND MONONUCLEAR NO SQUAMOUS EPITHELIAL CELLS SEEN NO ORGANISMS SEEN Performed at [facility]    Culture PENDING  Incomplete   Report Status PENDING  Incomplete  Culture, routine-abscess     Status: None (Preliminary result)   Collection Time: 07/13/15 10:47 PM  Result Value Ref Range Status   Specimen Description ABSCESS LEFT FINGER  Final   Special Requests NONE  Final   Gram Stain   Final    FEW WBC PRESENT,BOTH PMN AND MONONUCLEAR NO SQUAMOUS EPITHELIAL CELLS SEEN NO ORGANISMS SEEN Performed at [facility]    Culture NO GROWTH Performed at [facility]   Final   Report Status PENDING  Incomplete       Today   Subjective:   Barry Haley today has no headache,no chest abdominal pain,no new weakness tingling or numbness, feels much better today.   Objective:   Blood pressure 138/64, pulse 67, temperature 97.7 F (36.5 C), temperature source Oral, resp. rate 18, height 5\' 10"  (1.778 m), weight 103.4 kg (227 lb 15.3 oz), SpO2 98 %.   Intake/Output Summary (Last 24 hours) at 07/14/15 1447 Last data filed at 07/14/15 0933  Gross per 24 hour  Intake    536 ml  Output      0 ml  Net    536 ml    Exam Awake Alert, Oriented x 3, No new F.N deficits, Normal  affect [place].AT,PERRAL Supple Neck,No JVD, No cervical lymphadenopathy appriciated.  Symmetrical Chest wall movement, Good air movement bilaterally, CTAB RRR,No Gallops,Rubs or new Murmurs, No Parasternal Heave +ve B.Sounds, Abd Soft, Non tender, No organomegaly appriciated, No rebound -guarding or rigidity. No Cyanosis, Clubbing or edema, No new Rash or bruise, left middle finger  bandaged postoperatively.  Data Review   CBC w Diff: Lab Results  Component Value Date   WBC 6.3 07/14/2015   HGB 11.9* 07/14/2015   HCT 35.4* 07/14/2015   PLT 122* 07/14/2015   LYMPHOPCT 19 07/13/2015   MONOPCT 6 07/13/2015   EOSPCT 7 07/13/2015   BASOPCT 0 07/13/2015    CMP: Lab Results  Component Value Date   NA 136 07/14/2015   K 3.9 07/14/2015   CL 105 07/14/2015   CO2 26 07/14/2015   BUN 22* 07/14/2015   CREATININE 1.44* 07/14/2015   PROT 7.3 07/13/2015   ALBUMIN 3.4* 07/13/2015   BILITOT 0.4 07/13/2015   ALKPHOS 128* 07/13/2015   AST 27 07/13/2015   ALT 40 07/13/2015  .   Total Time in preparing paper work, data evaluation and todays exam - 35 minutes  ELGERGAWY, DAWOOD M.D on 07/14/2015 at 2:47 PM  [facility]   Office  [phone]

## 2015-07-14 NOTE — Discharge Instructions (Signed)
Follow with Primary MD REDDING Angelique Blonder., MD in 7 days   Get CBC, CMP, 2 view Chest X ray checked  by Primary MD next visit.    Activity: As tolerated with Full fall precautions use walker/cane & assistance as needed   Disposition Home    Diet: Heart Healthy , Carbohydrate modified , with feeding assistance and aspiration precautions.  For Heart failure patients - Check your Weight same time everyday, if you gain over 2 pounds, or you develop in leg swelling, experience more shortness of breath or chest pain, call your Primary MD immediately. Follow Cardiac Low Salt Diet and 1.5 lit/day fluid restriction.   On your next visit with your primary care physician please Get Medicines reviewed and adjusted.   Please request your Prim.MD to go over all Hospital Tests and Procedure/Radiological results at the follow up, please get all Hospital records sent to your Prim MD by signing hospital release before you go home.   If you experience worsening of your admission symptoms, develop shortness of breath, life threatening emergency, suicidal or homicidal thoughts you must seek medical attention immediately by calling 911 or calling your MD immediately  if symptoms less severe.  You Must read complete instructions/literature along with all the possible adverse reactions/side effects for all the Medicines you take and that have been prescribed to you. Take any new Medicines after you have completely understood and accpet all the possible adverse reactions/side effects.   Do not drive, operating heavy machinery, perform activities at heights, swimming or participation in water activities or provide baby sitting services if your were admitted for syncope or siezures until you have seen by Primary MD or a Neurologist and advised to do so again.  Do not drive when taking Pain medications.    Do not take more than prescribed Pain, Sleep and Anxiety Medications  Special Instructions: If you have  smoked or chewed Tobacco  in the last 2 yrs please stop smoking, stop any regular Alcohol  and or any Recreational drug use.  Wear Seat belts while driving.   Please note  You were cared for by a hospitalist during your hospital stay. If you have any questions about your discharge medications or the care you received while you were in the hospital after you are discharged, you can call the unit and asked to speak with the hospitalist on call if the hospitalist that took care of you is not available. Once you are discharged, your primary care physician will handle any further medical issues. Please note that NO REFILLS for any discharge medications will be authorized once you are discharged, as it is imperative that you return to your primary care physician (or establish a relationship with a primary care physician if you do not have one) for your aftercare needs so that they can reassess your need for medications and monitor your lab values.

## 2015-07-14 NOTE — Progress Notes (Signed)
Pharmacist Provided - Patient Medication Education Prior to Discharge   Barry Haley is an 69 y.o. male who presented to [facility] on 07/13/2015 with a chief complaint of  Chief Complaint  Patient presents with  . Finger Injury    The patient think he stabbed himself with a steak knife three weeks ago.  He says he was reaching in the dishwasher and stabbed it then.  It is his left, middle finger.       [x]  Patient will be discharged with 3 new medications  The following medications were discussed with the patient: Augmentin, doxycycline, colchicine  Pain Control medications: []  Yes    []  No  Diabetes Medications: []  Yes    []  No  Heart Failure Medications: []  Yes    []  No  Anticoagulation Medications:  []  Yes    []  No  Antibiotics at discharge: [x]  Yes    []  No  Allergy Assessment Completed and Updated: []  Yes    [x]  No Identified Patient Allergies:  Allergies  Allergen Reactions  . Hctz [Hydrochlorothiazide] Other (See Comments)    Renal insufficiency  . Niaspan [Niacin Er] Itching  . Zolpidem Anxiety and Other (See Comments)    Bad dreams     Medication Adherence Assessment: []  Excellent (no doses missed/week)      [x]  Good (1 dose missed/week)      []  Partial (2-3 doses missed/week)      []  Poor (>3 doses missed/week)  Barriers to Obtaining Medications: []  Yes [x]  No   Assessment: Barry Haley remembered the names of his new medications and expressed understanding on the importance of completing his antibiotics and the side effects that may occur.  Time spent preparing for discharge counseling: 15 min Time spent counseling patient: 10 min  Joya San, PharmD Clinical Pharmacy Resident Pager # [phone] 07/14/2015 4:48 PM

## 2015-07-14 NOTE — Progress Notes (Signed)
Utilization review completed. Blaise Palladino, RN, BSN. 

## 2015-07-17 LAB — CULTURE, ROUTINE-ABSCESS: Culture: NO GROWTH

## 2015-07-18 LAB — ANAEROBIC CULTURE

## 2015-07-21 DIAGNOSIS — M109 Gout, unspecified: Secondary | ICD-10-CM | POA: Diagnosis not present

## 2015-07-21 DIAGNOSIS — I509 Heart failure, unspecified: Secondary | ICD-10-CM | POA: Diagnosis not present

## 2015-08-03 ENCOUNTER — Ambulatory Visit (INDEPENDENT_AMBULATORY_CARE_PROVIDER_SITE_OTHER): Payer: Medicare Other | Admitting: Neurology

## 2015-08-03 ENCOUNTER — Encounter: Payer: Self-pay | Admitting: Neurology

## 2015-08-03 VITALS — BP 125/66 | HR 95 | Ht 70.0 in | Wt 234.6 lb

## 2015-08-03 DIAGNOSIS — Z794 Long term (current) use of insulin: Secondary | ICD-10-CM

## 2015-08-03 DIAGNOSIS — I10 Essential (primary) hypertension: Secondary | ICD-10-CM

## 2015-08-03 DIAGNOSIS — N183 Chronic kidney disease, stage 3 unspecified: Secondary | ICD-10-CM

## 2015-08-03 DIAGNOSIS — E785 Hyperlipidemia, unspecified: Secondary | ICD-10-CM | POA: Diagnosis not present

## 2015-08-03 DIAGNOSIS — I63431 Cerebral infarction due to embolism of right posterior cerebral artery: Secondary | ICD-10-CM | POA: Diagnosis not present

## 2015-08-03 DIAGNOSIS — I671 Cerebral aneurysm, nonruptured: Secondary | ICD-10-CM

## 2015-08-03 DIAGNOSIS — E1122 Type 2 diabetes mellitus with diabetic chronic kidney disease: Secondary | ICD-10-CM | POA: Insufficient documentation

## 2015-08-03 DIAGNOSIS — I6340 Cerebral infarction due to embolism of unspecified cerebral artery: Secondary | ICD-10-CM

## 2015-08-03 HISTORY — DX: Type 2 diabetes mellitus with diabetic chronic kidney disease: E11.22

## 2015-08-03 HISTORY — DX: Chronic kidney disease, stage 3 unspecified: N18.30

## 2015-08-03 MED ORDER — ASPIRIN EC 325 MG PO TBEC: 325.0000 mg | DELAYED_RELEASE_TABLET | Freq: Every day | ORAL | Status: DC

## 2015-08-03 NOTE — Patient Instructions (Addendum)
-   continue ASA and lipitor for stroke prevention. May consider change to plavix in the future after anemia work up. - follow up with GI for work up of anemia. - follow up with Dr. Kathyrn Sheriff for aneurysm follow up.  - continue to follow up with cardiology for CHF and HTN - Follow up with your primary care physician for stroke risk factor modification. Recommend maintain blood pressure goal <130/80, diabetes with hemoglobin A1c goal below 6.5% and lipids with LDL cholesterol goal below 70 mg/dL.  - check BP and glucose at home - follow up in 6 months

## 2015-08-03 NOTE — Progress Notes (Signed)
STROKE NEUROLOGY FOLLOW UP NOTE  NAME: Barry Haley DOB: Aug 16, 1946  REASON FOR VISIT: stroke follow up HISTORY FROM: chart and pt  Today we had the pleasure of seeing Barry Haley in follow-up at our Neurology Clinic. Pt was accompanied by no one.   History Summary For detailed info please refer to my note on 04/16/14.  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 In 2014, he again developed sepsis with bacteremia and TEE showed endocarditis. He had MV and TV repair in [facility]. Recently developed CHF and followed up with Dr. Bridgett Larsson in [facility] cardiology. He started to have left hemianopia and left visual disturbance mid August and concerns for a new stroke. During last clinic visit, I ordered MRI and TEE and EEG. However, before these being done, he was admitted to [facility] due to worsening generalized weakness and confusion. Found to have DM and DKA and A1C 13.7 as well as AKI. He was started on insulin drip and then subq. His MRI done should no acute stroke and TEE showed PFO and EF 35-40% without vegetations, EEG negative for seizure. His visual disturbance was considered as recrudescence of previous right occipital hemorrhagic infarct in 2011. He was continued on ASA and sent to CIR.   05/27/14 follow up - the patient has been doing well. He has OT to follow twice a week. His visual disturbance is gone. Blood sugar at home 110-120s. BP stable today 125/54. He is on insulin subq for DM control. He has PCP to see on 10/7 and cardiologist to f/u on 06/10/14. 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   09/27/14 follow up - pt was doing OK with neurology symptoms. However, he was admitted in 06/2014 for CHF exacerbation. He was resumed on lasix and much improved. He currently following up with his cardiology in [place]. His ASA was increased  to 325mg . His BP today in clinic was 140/49. Otherwise, he does not have neuro complains. Dr. Cleotilde Neer office has contacted him for appointment but he hold it off due to CHF exacerbation at that time.   02/01/15 follow up - pt has been doing well. 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 Pt tolerate well and procedure successful. Currently, he is on ASA and plavix. And Plan is to continue plavix for 6 months and then repeat cerebral angio. Pt BP today 136/65 and he stated that his glucose up and down at home but recent A1C was 7.6, significantly improved from before. No other complains.   Interval History During the interval time, pt has been doing well. He followed with Dr. Kathyrn Sheriff for aneurysm s/p pipeline stent and seems doing well, his dual antiplatelet was changed to ASA 325mg  alone. He had left middle finger gout earlier this month and was treated surgically. He stated that his last A1C was 6.6. He checks BP at home around 125/70. And his glucose at home around 115-118. He has appointment in 2 days with GI for EGD and colonoscopy for anemia work up.  REVIEW OF SYSTEMS: Full 14 system review of systems performed and notable only for those listed below and in HPI above, all others are negative:  Constitutional: N/A  Cardiovascular: murmur  Ear/Nose/Throat: N/A  Skin: itching  Eyes: N/A  Respiratory:   Gastroitestinal: N/A  Genitourinary: N/A Hematology/Lymphatic: anemia  Endocrine: excessive thirst  Musculoskeletal: joint swelling Allergy/Immunology:   Neurological: Psychiatric: agitation, nervous, anxious  The following represents the patient's updated allergies and side effects list: Allergies  Allergen Reactions  . Hctz [Hydrochlorothiazide] Other (See Comments)    Renal insufficiency  . Niaspan [Niacin Er] Itching  . Zolpidem Anxiety and Other (See Comments)    Bad dreams    Labs since last visit of relevance  include the following: Results for orders placed or performed during the hospital encounter of 07/13/15  Anaerobic culture  Result Value Ref Range   Specimen Description ABSCESS LEFT FINGER    Special Requests NONE    Gram Stain      FEW WBC PRESENT,BOTH PMN AND MONONUCLEAR NO SQUAMOUS EPITHELIAL CELLS SEEN NO ORGANISMS SEEN Performed at [facility]    Culture      NO ANAEROBES ISOLATED Performed at [facility]    Report Status 07/18/2015 FINAL   Culture, routine-abscess  Result Value Ref Range   Specimen Description ABSCESS LEFT FINGER    Special Requests NONE    Gram Stain      FEW WBC PRESENT,BOTH PMN AND MONONUCLEAR NO SQUAMOUS EPITHELIAL CELLS SEEN NO ORGANISMS SEEN Performed at [facility]    Culture      NO GROWTH 3 DAYS Performed at [facility]    Report Status 07/17/2015 FINAL   Comprehensive metabolic panel  Result Value Ref Range   Sodium 140 135 - 145 mmol/L   Potassium 4.0 3.5 - 5.1 mmol/L   Chloride 108 101 - 111 mmol/L   CO2 26 22 - 32 mmol/L   Glucose, Bld 99 65 - 99 mg/dL   BUN 23 (H) 6 - 20 mg/dL   Creatinine, Ser 1.52 (H) 0.61 - 1.24 mg/dL   Calcium 9.0 8.9 - 10.3 mg/dL   Total Protein 7.3 6.5 - 8.1 g/dL   Albumin 3.4 (L) 3.5 - 5.0 g/dL   AST 27 15 - 41 U/L   ALT 40 17 - 63 U/L   Alkaline Phosphatase 128 (H) 38 - 126 U/L   Total Bilirubin 0.4 0.3 - 1.2 mg/dL   GFR calc non Af Amer 45 (L) >60 mL/min   GFR calc Af Amer 52 (L) >60 mL/min   Anion gap 6 5 - 15  CBC with Differential  Result Value Ref Range   WBC 6.4 4.0 - 10.5 K/uL   RBC 4.79 4.22 - 5.81 MIL/uL   Hemoglobin 12.9 (L) 13.0 - 17.0 g/dL   HCT 39.4 39.0 - 52.0 %   MCV 82.3 78.0 - 100.0 fL   MCH 26.9 26.0 - 34.0 pg   MCHC 32.7 30.0 - 36.0 g/dL   RDW 14.2 11.5 - 15.5 %   Platelets 144 (L) 150 - 400 K/uL   Neutrophils Relative % 68 %   Neutro Abs 4.3 1.7 - 7.7 K/uL   Lymphocytes Relative 19 %   Lymphs Abs 1.2 0.7 - 4.0 K/uL   Monocytes  Relative 6 %   Monocytes Absolute 0.4 0.1 - 1.0 K/uL   Eosinophils Relative 7 %   Eosinophils Absolute 0.4 0.0 - 0.7 K/uL   Basophils Relative 0 %   Basophils Absolute 0.0 0.0 - 0.1 K/uL  Uric acid  Result Value Ref Range   Uric Acid, Serum 11.6 (H) 4.4 - 7.6 mg/dL  Glucose, capillary  Result Value Ref Range   Glucose-Capillary 70 65 - 99 mg/dL  CBC  Result Value Ref Range  WBC 6.3 4.0 - 10.5 K/uL   RBC 4.27 4.22 - 5.81 MIL/uL   Hemoglobin 11.9 (L) 13.0 - 17.0 g/dL   HCT 35.4 (L) 39.0 - 52.0 %   MCV 82.9 78.0 - 100.0 fL   MCH 27.9 26.0 - 34.0 pg   MCHC 33.6 30.0 - 36.0 g/dL   RDW 14.6 11.5 - 15.5 %   Platelets 122 (L) 150 - 400 K/uL  Basic metabolic panel  Result Value Ref Range   Sodium 136 135 - 145 mmol/L   Potassium 3.9 3.5 - 5.1 mmol/L   Chloride 105 101 - 111 mmol/L   CO2 26 22 - 32 mmol/L   Glucose, Bld 204 (H) 65 - 99 mg/dL   BUN 22 (H) 6 - 20 mg/dL   Creatinine, Ser 1.44 (H) 0.61 - 1.24 mg/dL   Calcium 8.6 (L) 8.9 - 10.3 mg/dL   GFR calc non Af Amer 48 (L) >60 mL/min   GFR calc Af Amer 56 (L) >60 mL/min   Anion gap 5 5 - 15  Glucose, capillary  Result Value Ref Range   Glucose-Capillary 138 (H) 65 - 99 mg/dL  Glucose, capillary  Result Value Ref Range   Glucose-Capillary 176 (H) 65 - 99 mg/dL  I-Stat CG4 Lactic Acid, ED  (not at [facility])  Result Value Ref Range   Lactic Acid, Venous 0.45 (L) 0.5 - 2.0 mmol/L  I-Stat CG4 Lactic Acid, ED  (not at [facility])  Result Value Ref Range   Lactic Acid, Venous 0.78 0.5 - 2.0 mmol/L    The neurologically relevant items on the patient's problem list were reviewed on today's visit.  Neurologic Examination  A problem focused neurological exam (12 or more points of the single system neurologic examination, vital signs counts as 1 point, cranial nerves count for 8 points) was performed.  Blood pressure 125/66, pulse 95, height 5\' 10"  (1.778 m), weight 234 lb 9.6 oz (106.414 kg).  General - Well nourished, well developed,  in no apparent distress.  Ophthalmologic - Sharp disc margins OU.  Cardiovascular - Regular rate and rhythm with no murmur.  Mental Status -  Level of arousal and orientation to time, place, and person were intact. Language including expression, naming, repetition, comprehension was assessed and found intact. Attention span and concentration were impaired. Fund of Knowledge was impaired, not knowing previous presidents.  Cranial Nerves II - XII - II - Visual field intact OU. III, IV, VI - Extraocular movements intact. V - Facial sensation intact bilaterally. VII - Facial movement intact bilaterally. VIII - Hearing & vestibular intact bilaterally. X - Palate elevates symmetrically. XI - Chin turning & shoulder shrug intact bilaterally. XII - Tongue protrusion intact.  Motor Strength - The patient's strength was normal in all extremities and pronator drift was absent.  Bulk was normal and fasciculations were absent.   Motor Tone - Muscle tone was assessed at the neck and appendages and was normal.  Reflexes - The patient's reflexes were normal in all extremities and he had no pathological reflexes.  Sensory - Light touch, temperature/pinprick were assessed and were normal.    Coordination - The patient had normal movements in the hands and feet with no ataxia or dysmetria.  Tremor was absent.  Gait and Station - large based gait but steady.  Data reviewed: I personally reviewed the images and agree with the radiology interpretations.  Ct Head Wo Contrast  04/21/2014 IMPRESSION: 1. No acute intracranial abnormality. 2. Atrophy and chronic microvascular  white matter ischemic changes.   CTA head and neck 11/13/10 1. 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 Stable anterior communicating artery aneurysm 54mm. 3. No acute or focal abnormality otherwise. 4. Remote encephalomalacia of the right occipital lobe. 5. Areas of remote encephalomalacia and calcification  in the posterior left frontal lobe.  CUS 01/2013  This is an abnormal carotid duplex ultrasound with color flow imaging. .  There is 50-75% stenosis of the left ICA by Doppler criteria, although this  is likely at the low end of the range. No severe luminal narrowing noted,  although there is moderate plaque. The Vertebrals demonstrate foward flow  bilaterally . Insonation at multiple depths was attempted of the  [facility]. This is an abnormal Transcranial  Doppler Ultrasound examination. The right ACA velocity is slightly elevated,  but does not meet criteria for stenosis and most likely reflects hyperemia or  collateral flow. The resistance is increased in multiple vessels, which may  point to diffuse atherosclerosis or distal small vessel disease. However,  there is no evidence of stenosis, vasospasm, or flow diversion. There was no  previous study available for direct comparison.   CUS 04/2014 - 1-39% internal carotid artery stenosis bilaterally. Vertebral arteries are patent with antegrade flow.   TEE - Impression: No vegetations seen EF 35-40%  + PFO ( markedly positive)  He is bradycardic and LV function is moderately reduced.  I have DCd his Diltiazem for now..  Transthoracic echo to be done.   TTE - Left ventricle: Systolic function was moderately to severely reduced. The estimated ejection fraction was in the range of 30% to 35%. - Aortic valve: No evidence of vegetation. - Mitral valve: No evidence of vegetation. - Tricuspid valve: No evidence of vegetation.   EEG - Summary This is a severely abnormal EEG due to the presence of severe background slowing indicative of bihemispheric dysfunction which is a nonspecific finding seen in a variety of degenerative, toxic, metabolic and ischemic etiologies. No definite epileptiform activity is noted.   MRI head and MRA neck and head 04/23/14 Negative for acute infarct Numerous areas of chronic  hemorrhage in the brain. Question  hypertension versus cerebral amyloid.  Diffuse intracranial atherosclerotic disease without large vessel occlusion.  2 mm aneurysm anterior communicating artery.  Question severe stenosis proximal right common carotid artery versus  artifact. Favor artifact. Both carotid bifurcations are patent  bilaterally  Atherosclerotic disease in the proximal vertebral arteries. Focal  area of signal loss in the proximal right vertebral artery may be  due to severe stenosis or artifact.   Component     Latest Ref Rng 04/16/2014 04/24/2014  Cholesterol, Total     100 - 199 mg/dL 142   Triglycerides     0 - 149 mg/dL 196 (H)   HDL     >39 mg/dL 25 (L)   VLDL Cholesterol Cal     5 - 40 mg/dL 39   LDL (calc)     0 - 99 mg/dL 78   Total CHOL/HDL Ratio     0.0 - 5.0 ratio units 5.7 (H)   TSH     0.450 - 4.500 uIU/mL 1.400   Free T4     0.82 - 1.77 ng/dL 1.56   Hemoglobin A1C     4.8 - 5.6 % 13.7 (H)   Estimated average glucose      346   Vitamin B-12     211 - 911 pg/mL  1640 (H)  Assessment: As you may recall, he is a 69 y.o. 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 MRI negative for acute stroke, TEE no vegetation and EEG no seizure. His neuro presentation was thought to be recrudescence of his old right occipital stroke. He recovered well after glucose under control. On follow up, his neuro stable and no recurrent neuro symptoms. He has CHF and following with local cardiologist. He had pipeline stent for his right cavernous ICA aneurysm in 01/2015, currently on ASA and plavix. Followed up with Dr. Kathyrn Sheriff in 07/2015 showed good result and plavix discontinued. He has GI appointment in 2 days for anemia work up, will  continue ASA for now. May consider plavix monotherapy in the future.  Plan:  - continue ASA and lipitor for stroke prevention. May consider change to plavix in the future after anemia work up. - follow up with GI for work up of anemia. - follow up with Dr. Kathyrn Sheriff for aneurysm follow up.  - continue to follow up with cardiology for CHF and HTN - Follow up with your primary care physician for stroke risk factor modification. Recommend maintain blood pressure goal <130/80, diabetes with hemoglobin A1c goal below 6.5% and lipids with LDL cholesterol goal below 70 mg/dL.  - check BP and glucose at home - follow up in 6 months  I spent more than 25 minutes of face to face time with the patient. Greater than 50% of time was spent in counseling and coordination of care. We have discussed about options of antiplatelet therapy and the rationale of decision about ASA at this time.   No orders of the defined types were placed in this encounter.    Meds ordered this encounter  Medications  . oxyCODONE-acetaminophen (PERCOCET/ROXICET) 5-325 MG tablet    Sig: Take by mouth every 4 (four) hours as needed for severe pain.  Marland Kitchen aspirin EC 325 MG tablet    Sig: Take 1 tablet (325 mg total) by mouth daily.    Dispense:  30 tablet    Refill:  0    Patient Instructions  - continue ASA and lipitor for stroke prevention. May consider change to plavix in the future after anemia work up. - follow up with GI for work up of anemia. - follow up with Dr. Kathyrn Sheriff for aneurysm follow up.  - continue to follow up with cardiology for CHF and HTN - Follow up with your primary care physician for stroke risk factor modification. Recommend maintain blood pressure goal <130/80, diabetes with hemoglobin A1c goal below 6.5% and lipids with LDL cholesterol goal below 70 mg/dL.  - check BP and glucose at home - follow up in 6 months    Rosalin Hawking, MD PhD [facility] [address], [place], [place] [address] [phone]

## 2015-08-05 DIAGNOSIS — K210 Gastro-esophageal reflux disease with esophagitis: Secondary | ICD-10-CM | POA: Diagnosis not present

## 2015-08-05 DIAGNOSIS — D500 Iron deficiency anemia secondary to blood loss (chronic): Secondary | ICD-10-CM | POA: Diagnosis not present

## 2015-09-07 DIAGNOSIS — E1165 Type 2 diabetes mellitus with hyperglycemia: Secondary | ICD-10-CM | POA: Diagnosis not present

## 2015-09-07 DIAGNOSIS — M109 Gout, unspecified: Secondary | ICD-10-CM | POA: Diagnosis not present

## 2015-09-23 DIAGNOSIS — M109 Gout, unspecified: Secondary | ICD-10-CM | POA: Diagnosis not present

## 2015-09-23 DIAGNOSIS — E1143 Type 2 diabetes mellitus with diabetic autonomic (poly)neuropathy: Secondary | ICD-10-CM | POA: Diagnosis not present

## 2015-09-29 ENCOUNTER — Other Ambulatory Visit: Payer: Self-pay

## 2015-09-29 DIAGNOSIS — D500 Iron deficiency anemia secondary to blood loss (chronic): Secondary | ICD-10-CM | POA: Diagnosis not present

## 2015-09-29 DIAGNOSIS — Z7984 Long term (current) use of oral hypoglycemic drugs: Secondary | ICD-10-CM | POA: Diagnosis not present

## 2015-09-29 DIAGNOSIS — Z86010 Personal history of colonic polyps: Secondary | ICD-10-CM | POA: Diagnosis not present

## 2015-09-29 DIAGNOSIS — Z951 Presence of aortocoronary bypass graft: Secondary | ICD-10-CM | POA: Diagnosis not present

## 2015-09-29 DIAGNOSIS — K2970 Gastritis, unspecified, without bleeding: Secondary | ICD-10-CM | POA: Diagnosis not present

## 2015-09-29 DIAGNOSIS — K219 Gastro-esophageal reflux disease without esophagitis: Secondary | ICD-10-CM | POA: Diagnosis not present

## 2015-09-29 DIAGNOSIS — N400 Enlarged prostate without lower urinary tract symptoms: Secondary | ICD-10-CM | POA: Diagnosis not present

## 2015-09-29 DIAGNOSIS — I480 Paroxysmal atrial fibrillation: Secondary | ICD-10-CM | POA: Diagnosis not present

## 2015-09-29 DIAGNOSIS — D125 Benign neoplasm of sigmoid colon: Secondary | ICD-10-CM | POA: Diagnosis not present

## 2015-09-29 DIAGNOSIS — D124 Benign neoplasm of descending colon: Secondary | ICD-10-CM | POA: Diagnosis not present

## 2015-09-29 DIAGNOSIS — F419 Anxiety disorder, unspecified: Secondary | ICD-10-CM | POA: Diagnosis not present

## 2015-09-29 DIAGNOSIS — K5730 Diverticulosis of large intestine without perforation or abscess without bleeding: Secondary | ICD-10-CM | POA: Diagnosis not present

## 2015-09-29 DIAGNOSIS — E119 Type 2 diabetes mellitus without complications: Secondary | ICD-10-CM | POA: Diagnosis not present

## 2015-09-29 DIAGNOSIS — G4730 Sleep apnea, unspecified: Secondary | ICD-10-CM | POA: Diagnosis not present

## 2015-09-29 DIAGNOSIS — I509 Heart failure, unspecified: Secondary | ICD-10-CM | POA: Diagnosis not present

## 2015-09-29 DIAGNOSIS — E785 Hyperlipidemia, unspecified: Secondary | ICD-10-CM | POA: Diagnosis not present

## 2015-09-29 DIAGNOSIS — K2960 Other gastritis without bleeding: Secondary | ICD-10-CM | POA: Diagnosis not present

## 2015-09-29 DIAGNOSIS — D509 Iron deficiency anemia, unspecified: Secondary | ICD-10-CM | POA: Diagnosis not present

## 2015-09-29 DIAGNOSIS — Z87891 Personal history of nicotine dependence: Secondary | ICD-10-CM | POA: Diagnosis not present

## 2015-09-29 DIAGNOSIS — K648 Other hemorrhoids: Secondary | ICD-10-CM | POA: Diagnosis not present

## 2015-09-29 DIAGNOSIS — Z79899 Other long term (current) drug therapy: Secondary | ICD-10-CM | POA: Diagnosis not present

## 2015-09-29 DIAGNOSIS — F4310 Post-traumatic stress disorder, unspecified: Secondary | ICD-10-CM | POA: Diagnosis not present

## 2015-09-29 DIAGNOSIS — M109 Gout, unspecified: Secondary | ICD-10-CM | POA: Diagnosis not present

## 2015-09-29 DIAGNOSIS — Z9049 Acquired absence of other specified parts of digestive tract: Secondary | ICD-10-CM | POA: Diagnosis not present

## 2015-09-29 DIAGNOSIS — I10 Essential (primary) hypertension: Secondary | ICD-10-CM | POA: Diagnosis not present

## 2015-09-29 DIAGNOSIS — K2900 Acute gastritis without bleeding: Secondary | ICD-10-CM | POA: Diagnosis not present

## 2015-09-29 DIAGNOSIS — G43909 Migraine, unspecified, not intractable, without status migrainosus: Secondary | ICD-10-CM | POA: Diagnosis not present

## 2015-09-29 DIAGNOSIS — G4733 Obstructive sleep apnea (adult) (pediatric): Secondary | ICD-10-CM | POA: Diagnosis not present

## 2015-09-29 DIAGNOSIS — Z8673 Personal history of transient ischemic attack (TIA), and cerebral infarction without residual deficits: Secondary | ICD-10-CM | POA: Diagnosis not present

## 2015-10-06 ENCOUNTER — Telehealth: Payer: Self-pay | Admitting: Neurology

## 2015-10-06 DIAGNOSIS — I4891 Unspecified atrial fibrillation: Secondary | ICD-10-CM | POA: Diagnosis not present

## 2015-10-06 DIAGNOSIS — E118 Type 2 diabetes mellitus with unspecified complications: Secondary | ICD-10-CM | POA: Diagnosis not present

## 2015-10-06 DIAGNOSIS — Z8679 Personal history of other diseases of the circulatory system: Secondary | ICD-10-CM | POA: Diagnosis not present

## 2015-10-06 DIAGNOSIS — Z87448 Personal history of other diseases of urinary system: Secondary | ICD-10-CM | POA: Diagnosis not present

## 2015-10-06 DIAGNOSIS — Z9889 Other specified postprocedural states: Secondary | ICD-10-CM | POA: Diagnosis not present

## 2015-10-06 DIAGNOSIS — I10 Essential (primary) hypertension: Secondary | ICD-10-CM | POA: Diagnosis not present

## 2015-10-06 DIAGNOSIS — E785 Hyperlipidemia, unspecified: Secondary | ICD-10-CM | POA: Diagnosis not present

## 2015-10-06 NOTE — Telephone Encounter (Signed)
Pt called said he saw Dr Jyl Heinz -Cardiologist [phone] this morning and he has been dx with irregular heartbeat. Echocardiogram scheduled for 10/20/2015. Pt said he had labs today, checked thyroid function and will also need blood thinner medication that cardiologist will prescribe. Pt said could go on Eliquis depending on what labs show. Pt aware Dr Erlinda Hong is out of the office until next week. FYI only

## 2015-10-06 NOTE — Telephone Encounter (Signed)
Thank you for the message. Please let the pt know that he should let Dr. Kathyrn Sheriff know also since he treated his aneurysm. Also probably he should let his GI doctor know as he has seen his GI doctor for anemia. I am OK for him to take strong blood thinner from stroke standpoint. Thanks.  Rosalin Hawking, MD PhD Stroke Neurology 10/06/2015 3:10 PM

## 2015-10-07 NOTE — Telephone Encounter (Signed)
LFt vm for patient about Dr Erlinda Hong advice.

## 2015-10-11 NOTE — Telephone Encounter (Signed)
Patient is returning your call and can be reached [phone].  Thanks!

## 2015-10-12 DIAGNOSIS — N401 Enlarged prostate with lower urinary tract symptoms: Secondary | ICD-10-CM | POA: Diagnosis not present

## 2015-10-12 DIAGNOSIS — R972 Elevated prostate specific antigen [PSA]: Secondary | ICD-10-CM | POA: Diagnosis not present

## 2015-10-12 NOTE — Telephone Encounter (Addendum)
Rn receive incoming call from patient about his schedule test.Pt has not started Eliquis because his blood work showed elevated creatine levels. Pt has appt schedule for February 16 for echo,and they will repeat the blood work. Pt was on plavix but his cardiologist stated he needs a stronger blood thinner.  PT stated he has told his GI and neurosurgery about the labs, eliquis issue. Rn stated a message will be sent to Dr. Erlinda Hong.

## 2015-10-12 NOTE — Telephone Encounter (Signed)
LFt vm for patient to call back.

## 2015-10-13 NOTE — Telephone Encounter (Signed)
Dr.Xu saw message about patients elevated kidney function, and that patient is being seen by his cardiologist.

## 2015-10-20 DIAGNOSIS — I4891 Unspecified atrial fibrillation: Secondary | ICD-10-CM | POA: Diagnosis not present

## 2015-10-20 DIAGNOSIS — I517 Cardiomegaly: Secondary | ICD-10-CM | POA: Diagnosis not present

## 2015-10-24 DIAGNOSIS — N189 Chronic kidney disease, unspecified: Secondary | ICD-10-CM | POA: Diagnosis not present

## 2015-10-24 DIAGNOSIS — D509 Iron deficiency anemia, unspecified: Secondary | ICD-10-CM | POA: Diagnosis not present

## 2015-12-15 DIAGNOSIS — M109 Gout, unspecified: Secondary | ICD-10-CM | POA: Diagnosis not present

## 2015-12-15 DIAGNOSIS — E119 Type 2 diabetes mellitus without complications: Secondary | ICD-10-CM | POA: Diagnosis not present

## 2015-12-19 DIAGNOSIS — E1165 Type 2 diabetes mellitus with hyperglycemia: Secondary | ICD-10-CM | POA: Diagnosis not present

## 2015-12-19 DIAGNOSIS — K029 Dental caries, unspecified: Secondary | ICD-10-CM | POA: Diagnosis not present

## 2015-12-19 DIAGNOSIS — R531 Weakness: Secondary | ICD-10-CM | POA: Diagnosis not present

## 2015-12-19 DIAGNOSIS — R7989 Other specified abnormal findings of blood chemistry: Secondary | ICD-10-CM | POA: Diagnosis not present

## 2015-12-19 DIAGNOSIS — R079 Chest pain, unspecified: Secondary | ICD-10-CM | POA: Diagnosis not present

## 2015-12-22 DIAGNOSIS — E1165 Type 2 diabetes mellitus with hyperglycemia: Secondary | ICD-10-CM | POA: Diagnosis not present

## 2015-12-22 DIAGNOSIS — K0510 Chronic gingivitis, plaque induced: Secondary | ICD-10-CM | POA: Diagnosis not present

## 2015-12-22 DIAGNOSIS — Z1389 Encounter for screening for other disorder: Secondary | ICD-10-CM | POA: Diagnosis not present

## 2015-12-22 DIAGNOSIS — M109 Gout, unspecified: Secondary | ICD-10-CM | POA: Diagnosis not present

## 2016-01-05 DIAGNOSIS — Z9889 Other specified postprocedural states: Secondary | ICD-10-CM | POA: Diagnosis not present

## 2016-01-05 DIAGNOSIS — I4891 Unspecified atrial fibrillation: Secondary | ICD-10-CM | POA: Diagnosis not present

## 2016-01-05 DIAGNOSIS — Z8679 Personal history of other diseases of the circulatory system: Secondary | ICD-10-CM | POA: Diagnosis not present

## 2016-01-05 DIAGNOSIS — E785 Hyperlipidemia, unspecified: Secondary | ICD-10-CM | POA: Diagnosis not present

## 2016-01-05 DIAGNOSIS — I10 Essential (primary) hypertension: Secondary | ICD-10-CM | POA: Diagnosis not present

## 2016-01-05 DIAGNOSIS — E118 Type 2 diabetes mellitus with unspecified complications: Secondary | ICD-10-CM | POA: Diagnosis not present

## 2016-01-05 DIAGNOSIS — Z87448 Personal history of other diseases of urinary system: Secondary | ICD-10-CM | POA: Diagnosis not present

## 2016-01-31 ENCOUNTER — Ambulatory Visit (INDEPENDENT_AMBULATORY_CARE_PROVIDER_SITE_OTHER): Payer: Medicare Other | Admitting: Neurology

## 2016-01-31 ENCOUNTER — Encounter: Payer: Self-pay | Admitting: Neurology

## 2016-01-31 VITALS — BP 108/67 | HR 96 | Ht 70.0 in | Wt 228.6 lb

## 2016-01-31 DIAGNOSIS — N183 Chronic kidney disease, stage 3 unspecified: Secondary | ICD-10-CM

## 2016-01-31 DIAGNOSIS — I671 Cerebral aneurysm, nonruptured: Secondary | ICD-10-CM | POA: Diagnosis not present

## 2016-01-31 DIAGNOSIS — E785 Hyperlipidemia, unspecified: Secondary | ICD-10-CM | POA: Diagnosis not present

## 2016-01-31 DIAGNOSIS — I481 Persistent atrial fibrillation: Secondary | ICD-10-CM | POA: Diagnosis not present

## 2016-01-31 DIAGNOSIS — I10 Essential (primary) hypertension: Secondary | ICD-10-CM | POA: Diagnosis not present

## 2016-01-31 DIAGNOSIS — Z794 Long term (current) use of insulin: Secondary | ICD-10-CM | POA: Diagnosis not present

## 2016-01-31 DIAGNOSIS — E1122 Type 2 diabetes mellitus with diabetic chronic kidney disease: Secondary | ICD-10-CM | POA: Diagnosis not present

## 2016-01-31 DIAGNOSIS — I4819 Other persistent atrial fibrillation: Secondary | ICD-10-CM

## 2016-01-31 DIAGNOSIS — I63431 Cerebral infarction due to embolism of right posterior cerebral artery: Secondary | ICD-10-CM

## 2016-01-31 HISTORY — DX: Other persistent atrial fibrillation: I48.19

## 2016-01-31 NOTE — Patient Instructions (Signed)
-   continue ASA and lipitor for stroke prevention.  - follow up with cardiology for possible afib and consider stronger blood thinner such as eliquis - follow up with kidney doctor for evaluation - follow up with Dr. Kathyrn Sheriff for aneurysm follow up.  - Follow up with your primary care physician for stroke risk factor modification. Recommend maintain blood pressure goal <130/80, diabetes with hemoglobin A1c goal below 6.5% and lipids with LDL cholesterol goal below 70 mg/dL.  - follow up in 6 months

## 2016-01-31 NOTE — Progress Notes (Signed)
STROKE NEUROLOGY FOLLOW UP NOTE  NAME: Barry Haley DOB: 1945/10/12  REASON FOR VISIT: stroke follow up HISTORY FROM: chart and pt  Today we had the pleasure of seeing Barry Haley in follow-up at our Neurology Clinic. Pt was accompanied by no one.   History Summary For detailed info please refer to my note on 04/16/14.  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 In 2014, he again developed sepsis with bacteremia and TEE showed endocarditis. He had MV and TV repair in [facility]. Recently developed CHF and followed up with Dr. Bridgett Larsson in [facility] cardiology. He started to have left hemianopia and left visual disturbance mid August and concerns for a new stroke. During last clinic visit, I ordered MRI and TEE and EEG. However, before these being done, he was admitted to [facility] due to worsening generalized weakness and confusion. Found to have DM and DKA and A1C 13.7 as well as AKI. He was started on insulin drip and then subq. His MRI done should no acute stroke and TEE showed PFO and EF 35-40% without vegetations, EEG negative for seizure. His visual disturbance was considered as recrudescence of previous right occipital hemorrhagic infarct in 2011. He was continued on ASA and sent to CIR.   05/27/14 follow up - the patient has been doing well. He has OT to follow twice a week. His visual disturbance is gone. Blood sugar at home 110-120s. BP stable today 125/54. He is on insulin subq for DM control. He has PCP to see on 10/7 and cardiologist to f/u on 06/10/14. 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   09/27/14 follow up - pt was doing OK with neurology symptoms. However, he was admitted in 06/2014 for CHF exacerbation. He was resumed on lasix and much improved. He currently following up with his cardiology in [place]. His  ASA was increased to 325mg . His BP today in clinic was 140/49. Otherwise, he does not have neuro complains. Dr. Cleotilde Neer office has contacted him for appointment but he hold it off due to CHF exacerbation at that time.   02/01/15 follow up - pt has been doing well. 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 Pt tolerate well and procedure successful. Currently, he is on ASA and plavix. And Plan is to continue plavix for 6 months and then repeat cerebral angio. Pt BP today 136/65 and he stated that his glucose up and down at home but recent A1C was 7.6, significantly improved from before. No other complains.   08/03/15 follow up - pt has been doing well. He followed with Dr. Kathyrn Sheriff for aneurysm s/p pipeline stent and seems doing well, his dual antiplatelet was changed to ASA 325mg  alone. He had left middle finger gout earlier this month and was treated surgically. He stated that his last A1C was 6.6. He checks BP at home around 125/70. And his glucose at home around 115-118. He has appointment in 2 days with GI for EGD and colonoscopy for anemia work up.  Interval History During the interval time, pt has been doing well from stroke standpoint. No recurrent stroke like symptoms. As per pt, he had anemia work up with GI, had EGD/colonoscopy, both negative. He followed with Dr. Kathyrn Sheriff and was told aneurysm was repaired well. He had cardiology follow up and  found to have afib. However, due to elevated Cre, he was still on ASA and not on DOAC yet. He is going to see nephrology next week for evaluation. His glucose still not in good control. After his dental work, his glucose super high and was sent to ER received insulin treatment. His home dose insulin was increased after. Today BP 108/66.   REVIEW OF SYSTEMS: Full 14 system review of systems performed and notable only for those listed below and in HPI above, all others are negative:  Constitutional:  N/A  Cardiovascular: murmur  Ear/Nose/Throat: N/A  Skin:  Eyes: N/A  Respiratory:   Gastroitestinal: N/A  Genitourinary: N/A Hematology/Lymphatic:  Endocrine:  Musculoskeletal:  Allergy/Immunology:   Neurological: restless leg, apnea Psychiatric: agitation, nervous, anxious  The following represents the patient's updated allergies and side effects list: Allergies  Allergen Reactions  . Hctz [Hydrochlorothiazide] Other (See Comments)    Renal insufficiency  . Niaspan [Niacin Er] Itching  . Zolpidem Anxiety and Other (See Comments)    Bad dreams Bad dreams    Labs since last visit of relevance include the following: Results for orders placed or performed during the hospital encounter of 07/13/15  Anaerobic culture  Result Value Ref Range   Specimen Description ABSCESS LEFT FINGER    Special Requests NONE    Gram Stain      FEW WBC PRESENT,BOTH PMN AND MONONUCLEAR NO SQUAMOUS EPITHELIAL CELLS SEEN NO ORGANISMS SEEN Performed at [facility]    Culture      NO ANAEROBES ISOLATED Performed at [facility]    Report Status 07/18/2015 FINAL   Culture, routine-abscess  Result Value Ref Range   Specimen Description ABSCESS LEFT FINGER    Special Requests NONE    Gram Stain      FEW WBC PRESENT,BOTH PMN AND MONONUCLEAR NO SQUAMOUS EPITHELIAL CELLS SEEN NO ORGANISMS SEEN Performed at [facility]    Culture      NO GROWTH 3 DAYS Performed at [facility]    Report Status 07/17/2015 FINAL   Comprehensive metabolic panel  Result Value Ref Range   Sodium 140 135 - 145 mmol/L   Potassium 4.0 3.5 - 5.1 mmol/L   Chloride 108 101 - 111 mmol/L   CO2 26 22 - 32 mmol/L   Glucose, Bld 99 65 - 99 mg/dL   BUN 23 (H) 6 - 20 mg/dL   Creatinine, Ser 1.52 (H) 0.61 - 1.24 mg/dL   Calcium 9.0 8.9 - 10.3 mg/dL   Total Protein 7.3 6.5 - 8.1 g/dL   Albumin 3.4 (L) 3.5 - 5.0 g/dL   AST 27 15 - 41 U/L   ALT 40 17 - 63 U/L   Alkaline Phosphatase 128  (H) 38 - 126 U/L   Total Bilirubin 0.4 0.3 - 1.2 mg/dL   GFR calc non Af Amer 45 (L) >60 mL/min   GFR calc Af Amer 52 (L) >60 mL/min   Anion gap 6 5 - 15  CBC with Differential  Result Value Ref Range   WBC 6.4 4.0 - 10.5 K/uL   RBC 4.79 4.22 - 5.81 MIL/uL   Hemoglobin 12.9 (L) 13.0 - 17.0 g/dL   HCT 39.4 39.0 - 52.0 %   MCV 82.3 78.0 - 100.0 fL   MCH 26.9 26.0 - 34.0 pg   MCHC 32.7 30.0 - 36.0 g/dL   RDW 14.2 11.5 - 15.5 %   Platelets 144 (L) 150 - 400 K/uL   Neutrophils  Relative % 68 %   Neutro Abs 4.3 1.7 - 7.7 K/uL   Lymphocytes Relative 19 %   Lymphs Abs 1.2 0.7 - 4.0 K/uL   Monocytes Relative 6 %   Monocytes Absolute 0.4 0.1 - 1.0 K/uL   Eosinophils Relative 7 %   Eosinophils Absolute 0.4 0.0 - 0.7 K/uL   Basophils Relative 0 %   Basophils Absolute 0.0 0.0 - 0.1 K/uL  Uric acid  Result Value Ref Range   Uric Acid, Serum 11.6 (H) 4.4 - 7.6 mg/dL  Glucose, capillary  Result Value Ref Range   Glucose-Capillary 70 65 - 99 mg/dL  CBC  Result Value Ref Range   WBC 6.3 4.0 - 10.5 K/uL   RBC 4.27 4.22 - 5.81 MIL/uL   Hemoglobin 11.9 (L) 13.0 - 17.0 g/dL   HCT 35.4 (L) 39.0 - 52.0 %   MCV 82.9 78.0 - 100.0 fL   MCH 27.9 26.0 - 34.0 pg   MCHC 33.6 30.0 - 36.0 g/dL   RDW 14.6 11.5 - 15.5 %   Platelets 122 (L) 150 - 400 K/uL  Basic metabolic panel  Result Value Ref Range   Sodium 136 135 - 145 mmol/L   Potassium 3.9 3.5 - 5.1 mmol/L   Chloride 105 101 - 111 mmol/L   CO2 26 22 - 32 mmol/L   Glucose, Bld 204 (H) 65 - 99 mg/dL   BUN 22 (H) 6 - 20 mg/dL   Creatinine, Ser 1.44 (H) 0.61 - 1.24 mg/dL   Calcium 8.6 (L) 8.9 - 10.3 mg/dL   GFR calc non Af Amer 48 (L) >60 mL/min   GFR calc Af Amer 56 (L) >60 mL/min   Anion gap 5 5 - 15  Glucose, capillary  Result Value Ref Range   Glucose-Capillary 138 (H) 65 - 99 mg/dL  Glucose, capillary  Result Value Ref Range   Glucose-Capillary 176 (H) 65 - 99 mg/dL  I-Stat CG4 Lactic Acid, ED  (not at [facility])  Result Value Ref Range     Lactic Acid, Venous 0.45 (L) 0.5 - 2.0 mmol/L  I-Stat CG4 Lactic Acid, ED  (not at [facility])  Result Value Ref Range   Lactic Acid, Venous 0.78 0.5 - 2.0 mmol/L    The neurologically relevant items on the patient's problem list were reviewed on today's visit.  Neurologic Examination  A problem focused neurological exam (12 or more points of the single system neurologic examination, vital signs counts as 1 point, cranial nerves count for 8 points) was performed.  Blood pressure 108/67, pulse 96, height 5\' 10"  (1.778 m), weight 228 lb 9.6 oz (103.692 kg).  General - Well nourished, well developed, in no apparent distress.  Ophthalmologic - Sharp disc margins OU.  Cardiovascular - irregularly irregular heart rate and rhythm.  Mental Status -  Level of arousal and orientation to time, place, and person were intact. Language including expression, naming, repetition, comprehension was assessed and found intact. Attention span and concentration were impaired. Fund of Knowledge was impaired, not knowing previous presidents.  Cranial Nerves II - XII - II - Visual field intact OU. III, IV, VI - Extraocular movements intact. V - Facial sensation intact bilaterally. VII - Facial movement intact bilaterally. VIII - Hearing & vestibular intact bilaterally. X - Palate elevates symmetrically. XI - Chin turning & shoulder shrug intact bilaterally. XII - Tongue protrusion intact.  Motor Strength - The patient's strength was normal in all extremities and pronator drift was absent.  Bulk  was normal and fasciculations were absent.   Motor Tone - Muscle tone was assessed at the neck and appendages and was normal.  Reflexes - The patient's reflexes were normal in all extremities and he had no pathological reflexes.  Sensory - Light touch, temperature/pinprick were assessed and were normal.    Coordination - The patient had normal movements in the hands and feet with no ataxia or dysmetria.  Tremor  was absent.  Gait and Station - large based gait but steady.  Data reviewed: I personally reviewed the images and agree with the radiology interpretations.  Ct Head Wo Contrast  04/21/2014 IMPRESSION: 1. No acute intracranial abnormality. 2. Atrophy and chronic microvascular white matter ischemic changes.   CTA head and neck 11/13/10 1. 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 Stable anterior communicating artery aneurysm 29mm. 3. No acute or focal abnormality otherwise. 4. Remote encephalomalacia of the right occipital lobe. 5. Areas of remote encephalomalacia and calcification in the posterior left frontal lobe.  CUS 01/2013  This is an abnormal carotid duplex ultrasound with color flow imaging. .  There is 50-75% stenosis of the left ICA by Doppler criteria, although this  is likely at the low end of the range. No severe luminal narrowing noted,  although there is moderate plaque. The Vertebrals demonstrate foward flow  bilaterally . Insonation at multiple depths was attempted of the  [facility]. This is an abnormal Transcranial  Doppler Ultrasound examination. The right ACA velocity is slightly elevated,  but does not meet criteria for stenosis and most likely reflects hyperemia or  collateral flow. The resistance is increased in multiple vessels, which may  point to diffuse atherosclerosis or distal small vessel disease. However,  there is no evidence of stenosis, vasospasm, or flow diversion. There was no  previous study available for direct comparison.   CUS 04/2014 - 1-39% internal carotid artery stenosis bilaterally. Vertebral arteries are patent with antegrade flow.   TEE - Impression: No vegetations seen EF 35-40%  + PFO ( markedly positive)  He is bradycardic and LV function is moderately reduced.  I have DCd his Diltiazem for now..  Transthoracic echo to be done.   TTE - Left ventricle: Systolic function was  moderately to severely reduced. The estimated ejection fraction was in the range of 30% to 35%. - Aortic valve: No evidence of vegetation. - Mitral valve: No evidence of vegetation. - Tricuspid valve: No evidence of vegetation.   EEG - Summary This is a severely abnormal EEG due to the presence of severe background slowing indicative of bihemispheric dysfunction which is a nonspecific finding seen in a variety of degenerative, toxic, metabolic and ischemic etiologies. No definite epileptiform activity is noted.   MRI head and MRA neck and head 04/23/14 Negative for acute infarct Numerous areas of chronic hemorrhage in the brain. Question  hypertension versus cerebral amyloid.  Diffuse intracranial atherosclerotic disease without large vessel occlusion.  2 mm aneurysm anterior communicating artery.  Question severe stenosis proximal right common carotid artery versus  artifact. Favor artifact. Both carotid bifurcations are patent  bilaterally  Atherosclerotic disease in the proximal vertebral arteries. Focal  area of signal loss in the proximal right vertebral artery may be  due to severe stenosis or artifact.   Component     Latest Ref Rng 04/16/2014 04/24/2014  Cholesterol, Total     100 - 199 mg/dL 142   Triglycerides     0 - 149 mg/dL 196 (H)  HDL     >39 mg/dL 25 (L)   VLDL Cholesterol Cal     5 - 40 mg/dL 39   LDL (calc)     0 - 99 mg/dL 78   Total CHOL/HDL Ratio     0.0 - 5.0 ratio units 5.7 (H)   TSH     0.450 - 4.500 uIU/mL 1.400   Free T4     0.82 - 1.77 ng/dL 1.56   Hemoglobin A1C     4.8 - 5.6 % 13.7 (H)   Estimated average glucose      346   Vitamin B-12     211 - 911 pg/mL  1640 (H)    Assessment: As you may recall, he is a 70 y.o. 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 MRI negative for acute stroke, TEE no vegetation and EEG no seizure. His neuro presentation was thought to be recrudescence of his old right occipital stroke. He recovered well after glucose under control. On follow up, his neuro stable and no recurrent neuro symptoms. He has CHF and following with local cardiologist. He had pipeline stent for his right cavernous ICA aneurysm in 01/2015, currently on ASA and plavix. Followed up with Dr. Kathyrn Sheriff in 07/2015 showed good result and plavix discontinued. He has EGD/colonoscopy for anemia work up, but unrevealing. Cardiology found that pt has afib and plan for [facility] but currently still on ASA due to elevated Cre. Has nephrologist appointment in 2 days.   Plan:  - continue ASA and lipitor for stroke prevention for now.  - follow up with cardiology for afib and consider DOACs such as eliquis - follow up with nephrology for evaluation of elevated Cre - follow up with Dr. Kathyrn Sheriff for aneurysm follow up.  - Follow up with your primary care physician for stroke risk factor modification. Recommend maintain blood pressure goal <130/80, diabetes with hemoglobin A1c goal below 6.5% and lipids with LDL cholesterol goal below 70 mg/dL.  - follow up in 6 months  I spent more than 25 minutes of face to face time with the patient. Greater than 50% of time was spent in counseling and coordination of care. We have discussed about options of anticoagulation, follow up with nephrology and cardiology, as well as glucose control.   No orders of the defined types were placed in this encounter.    Meds ordered this encounter  Medications  . DISCONTD: aspirin (GOODSENSE ASPIRIN) 325 MG tablet    Sig: Take 325 mg by mouth. Reported on 01/31/2016  . Insulin Glargine (LANTUS SOLOSTAR) 100 UNIT/ML Solostar Pen    Sig: Inject into the skin. Take 34 units at pm  . glimepiride (AMARYL) 4 MG tablet    Sig:   . DISCONTD:  Fe Fum-FA-B Cmp-C-Zn-Mg-Mn-Cu (HEMOCYTE PLUS) 106-1 MG CAPS    Sig: Reported on 01/31/2016    There are no Patient Instructions on file for this visit.  Rosalin Hawking, MD PhD [facility] [address] [place], [place] [address] [phone]

## 2016-02-01 DIAGNOSIS — D631 Anemia in chronic kidney disease: Secondary | ICD-10-CM | POA: Diagnosis not present

## 2016-02-01 DIAGNOSIS — N2581 Secondary hyperparathyroidism of renal origin: Secondary | ICD-10-CM | POA: Diagnosis not present

## 2016-02-01 DIAGNOSIS — N183 Chronic kidney disease, stage 3 (moderate): Secondary | ICD-10-CM | POA: Diagnosis not present

## 2016-02-13 DIAGNOSIS — Z9889 Other specified postprocedural states: Secondary | ICD-10-CM | POA: Diagnosis not present

## 2016-02-13 DIAGNOSIS — E785 Hyperlipidemia, unspecified: Secondary | ICD-10-CM | POA: Diagnosis not present

## 2016-02-13 DIAGNOSIS — I4891 Unspecified atrial fibrillation: Secondary | ICD-10-CM | POA: Diagnosis not present

## 2016-02-13 DIAGNOSIS — Z87448 Personal history of other diseases of urinary system: Secondary | ICD-10-CM | POA: Diagnosis not present

## 2016-02-13 DIAGNOSIS — E118 Type 2 diabetes mellitus with unspecified complications: Secondary | ICD-10-CM | POA: Diagnosis not present

## 2016-02-13 DIAGNOSIS — Z794 Long term (current) use of insulin: Secondary | ICD-10-CM | POA: Diagnosis not present

## 2016-02-13 DIAGNOSIS — Z8679 Personal history of other diseases of the circulatory system: Secondary | ICD-10-CM | POA: Diagnosis not present

## 2016-02-13 DIAGNOSIS — I10 Essential (primary) hypertension: Secondary | ICD-10-CM | POA: Diagnosis not present

## 2016-04-10 DIAGNOSIS — N401 Enlarged prostate with lower urinary tract symptoms: Secondary | ICD-10-CM | POA: Diagnosis not present

## 2016-04-10 DIAGNOSIS — R972 Elevated prostate specific antigen [PSA]: Secondary | ICD-10-CM | POA: Diagnosis not present

## 2016-04-12 DIAGNOSIS — E1165 Type 2 diabetes mellitus with hyperglycemia: Secondary | ICD-10-CM | POA: Diagnosis not present

## 2016-04-18 DIAGNOSIS — Z1389 Encounter for screening for other disorder: Secondary | ICD-10-CM | POA: Diagnosis not present

## 2016-04-18 DIAGNOSIS — Z9181 History of falling: Secondary | ICD-10-CM | POA: Diagnosis not present

## 2016-04-18 DIAGNOSIS — E1165 Type 2 diabetes mellitus with hyperglycemia: Secondary | ICD-10-CM | POA: Diagnosis not present

## 2016-04-18 DIAGNOSIS — Z0000 Encounter for general adult medical examination without abnormal findings: Secondary | ICD-10-CM | POA: Diagnosis not present

## 2016-05-21 DIAGNOSIS — E118 Type 2 diabetes mellitus with unspecified complications: Secondary | ICD-10-CM | POA: Diagnosis not present

## 2016-05-21 DIAGNOSIS — E785 Hyperlipidemia, unspecified: Secondary | ICD-10-CM | POA: Diagnosis not present

## 2016-05-21 DIAGNOSIS — I10 Essential (primary) hypertension: Secondary | ICD-10-CM | POA: Diagnosis not present

## 2016-05-21 DIAGNOSIS — I4891 Unspecified atrial fibrillation: Secondary | ICD-10-CM | POA: Diagnosis not present

## 2016-05-21 DIAGNOSIS — Z9889 Other specified postprocedural states: Secondary | ICD-10-CM | POA: Diagnosis not present

## 2016-05-21 DIAGNOSIS — Z87448 Personal history of other diseases of urinary system: Secondary | ICD-10-CM | POA: Diagnosis not present

## 2016-06-15 ENCOUNTER — Ambulatory Visit (HOSPITAL_COMMUNITY)
Admission: EM | Admit: 2016-06-15 | Discharge: 2016-06-15 | Disposition: A | Discharge status: Home / Self Care | Payer: Medicare Other | Attending: Emergency Medicine | Admitting: Emergency Medicine

## 2016-06-15 ENCOUNTER — Ambulatory Visit (INDEPENDENT_AMBULATORY_CARE_PROVIDER_SITE_OTHER): Payer: Medicare Other

## 2016-06-15 ENCOUNTER — Encounter (HOSPITAL_COMMUNITY): Payer: Self-pay | Admitting: Emergency Medicine

## 2016-06-15 DIAGNOSIS — S93402A Sprain of unspecified ligament of left ankle, initial encounter: Secondary | ICD-10-CM | POA: Diagnosis not present

## 2016-06-15 DIAGNOSIS — M25572 Pain in left ankle and joints of left foot: Secondary | ICD-10-CM | POA: Diagnosis not present

## 2016-06-15 NOTE — ED Triage Notes (Signed)
Pt reports he inverted his left ankle yest in his driveway... Reports he stepped in a hole   Sx today include: pain, swelling and unable to bear wt  Brought back on wheelchair.... A&O x4... NAD

## 2016-06-15 NOTE — ED Provider Notes (Signed)
CSN: [id number]     Arrival date & time 06/15/16  1355 History   None    Chief Complaint  Patient presents with  . Ankle Injury   (Consider location/radiation/quality/duration/timing/severity/associated sxs/prior Treatment) 70 yr old caucasian male presents to UC with 1 day hx of left ankle pain after turning ankle/ stepping in a hole, hurts to bear wt now. Took tramadol at home for pain.    The history is provided by the patient. No language interpreter was used.  Ankle Pain  Location:  Ankle (left lateral) Injury: yes   Mechanism of injury comment:  Stepped in hole Ankle location:  L ankle Pain details:    Quality:  Aching   Radiates to:  Does not radiate   Severity:  Moderate   Onset quality:  Sudden   Duration:  1 day   Timing:  Constant   Progression:  Worsening Chronicity:  New Dislocation: no   Prior injury to area:  No Worsened by:  Bearing weight Associated symptoms: swelling     Past Medical History:  Diagnosis Date  . Anemia 2016   evaluated by Dr Hinton Rao @ [facility]  . Anemia, iron deficiency   . Aneurysm ([place]) 01/06/15   brain  . Anterior cerebral aneurysm 2016  . Anxiety state, unspecified   . Benign prostatic hyperplasia (BPH) with urinary urgency   . Cataracts, bilateral   . Cerebral aneurysm, nonruptured   . Chronic post-traumatic stress disorder (PTSD)   . CKD (chronic kidney disease)    Dr. Justin Mend, [facility]  . Diabetes ([place])    IDDm x 3 years  . Difficult intubation 01/2013   /[facility] 01/27/13: anterior cords; unsuccessful with MAC4 and MIL3, successful with #5 Glidescope  . Epistaxis   . GERD (gastroesophageal reflux disease)   . H/O epistaxis   . High cholesterol   . History of hiatal hernia   . Hx of migraines   . Hyperlipidemia   . Hypertension   . Meningitis 2010  . Neuropathy ([place])   . Nocturia more than twice per night   . Other shock without mention of trauma   . Personal history of other diseases of  digestive system   . Personal history of other disorder of urinary system   . Seborrheic dermatitis   . Shortness of breath dyspnea   . Sleep apnea    does not wear his cpap  . Unspecified cerebral artery occlusion with cerebral infarction    Past Surgical History:  Procedure Laterality Date  . APPENDECTOMY     child  . I&D EXTREMITY Left 07/13/2015   Procedure: IRRIGATION AND DEBRIDEMENT LEFT LONG FINGER;  Surgeon: Dayna Barker, MD;  Location: [place];  Service: Plastics;  Laterality: Left;  . INFECTED SKIN DEBRIDEMENT Left 07/13/2015   left long finger  . MINIMALLY INVASIVE TRICUSPID VALVE REPAIR  01/27/2013  . mitral valve reconstruction with cardiopulmonary bypass  01/27/2013  . RADIOLOGY WITH ANESTHESIA N/A 04/23/2014   Procedure: RADIOLOGY WITH ANESTHESIA;  Surgeon: Medication Radiologist, MD;  Location: [place];  Service: Radiology;  Laterality: N/A;  . RADIOLOGY WITH ANESTHESIA N/A 01/06/2015   Procedure: Pipeline Embolization;  Surgeon: Consuella Lose, MD;  Location: [place];  Service: Radiology;  Laterality: N/A;  . TEE WITHOUT CARDIOVERSION N/A 04/22/2014   Procedure: TRANSESOPHAGEAL ECHOCARDIOGRAM (TEE);  Surgeon: Thayer Headings, MD;  Location: [facility] ENDOSCOPY;  Service: Cardiovascular;  Laterality: N/A;   Family History  Problem Relation Age of Onset  . Heart  failure Mother   . Diabetes Mother   . Cervical cancer Mother   . Alzheimer's disease Father   . Diabetes Father   . Diabetes Brother    Social History  Substance Use Topics  . Smoking status: Former Smoker    Packs/day: 2.50    Years: 40.00    Types: Cigarettes    Quit date: 10/04/2008  . Smokeless tobacco: Never Used  . Alcohol use No     Comment: quit: 10/04/2008- rarely now    Review of Systems  Constitutional: Negative.   HENT: Negative.   Eyes: Negative.   Respiratory: Negative.   Cardiovascular: Negative.   Gastrointestinal: Negative.   Endocrine: Negative.   Genitourinary: Negative.   Musculoskeletal:  Positive for arthralgias, gait problem and joint swelling.  Skin: Negative for rash.  Allergic/Immunologic: Negative.   Neurological: Negative for weakness and numbness.  Hematological: Negative.   Psychiatric/Behavioral: Negative.   All other systems reviewed and are negative.   Allergies  Hctz [hydrochlorothiazide]; Niaspan [niacin er]; and Zolpidem  Home Medications   Prior to Admission medications   Medication Sig Start Date End Date Taking? Authorizing Provider  amLODipine (NORVASC) 10 MG tablet Take 1 tablet (10 mg total) by mouth daily. 06/30/14  Yes Velvet Bathe, MD  aspirin EC 325 MG tablet Take 1 tablet (325 mg total) by mouth daily. 08/03/15  Yes Rosalin Hawking, MD  atorvastatin (LIPITOR) 10 MG tablet Take 10 mg by mouth at bedtime.   Yes Historical Provider, MD  citalopram (CELEXA) 20 MG tablet Take 20 mg by mouth daily. 04/27/14  Yes Allie Bossier, MD  esomeprazole (NEXIUM) 40 MG capsule Take 40 mg by mouth daily at 12 noon.   Yes Historical Provider, MD  furosemide (LASIX) 40 MG tablet Take 40 mg by mouth daily.   Yes Historical Provider, MD  glimepiride (AMARYL) 4 MG tablet  01/11/16  Yes Historical Provider, MD  hydrALAZINE (APRESOLINE) 10 MG tablet Take 10 mg by mouth 3 (three) times daily.    Yes Historical Provider, MD  metoprolol succinate (TOPROL-XL) 50 MG 24 hr tablet Take 25 mg by mouth 2 (two) times daily. Take with or immediately following a meal.   Yes Historical Provider, MD  Omega-3 Fatty Acids (FISH OIL PO) Take 1 capsule by mouth daily.   Yes Historical Provider, MD  Tamsulosin HCl (FLOMAX) 0.4 MG CAPS Take 0.4 mg by mouth 2 (two) times daily.    Yes Historical Provider, MD  vitamin B-12 (CYANOCOBALAMIN) 1000 MCG tablet Take 1,000 mcg by mouth daily.     Yes Historical Provider, MD  ergocalciferol (VITAMIN D2) 50000 UNITS capsule Take 50,000 Units by mouth every 30 (thirty) days. No specific day    Historical Provider, MD  Fe Fum-FA-B Cmp-C-Zn-Mg-Mn-Cu (HEMOCYTE  PLUS PO) Take 1 tablet by mouth daily.    Historical Provider, MD  Insulin Glargine (LANTUS SOLOSTAR) 100 UNIT/ML Solostar Pen Inject into the skin. Take 34 units at pm 04/27/14   Historical Provider, MD  Insulin Pen Needle (AURORA PEN NEEDLES) 29G X 12MM MISC 8 Units by Does not apply route daily. 04/27/14   Allie Bossier, MD   Meds Ordered and Administered this Visit  Medications - No data to display  BP 129/55 (BP Location: Left Arm)   Pulse 73   Temp 98.4 F (36.9 C) (Oral)   Resp 12   SpO2 96%  No data found.   Physical Exam  Constitutional: He is oriented to person, place, and time.  Vital signs are normal. He appears well-developed and well-nourished. He is active and cooperative.  Non-toxic appearance. He does not have a sickly appearance. He does not appear ill. He appears distressed.  HENT:  Head: Normocephalic.  Right Ear: Tympanic membrane normal.  Left Ear: Tympanic membrane normal.  Nose: Nose normal.  Mouth/Throat: Uvula is midline and mucous membranes are normal.  Eyes: Pupils are equal, round, and reactive to light.  Neck: Trachea normal and normal range of motion. Muscular tenderness present. No Brudzinski's sign and no Kernig's sign noted.  Cardiovascular: Normal rate and regular rhythm.   Pulmonary/Chest: Effort normal and breath sounds normal.  Musculoskeletal: He exhibits tenderness. He exhibits no deformity.       Right shoulder: He exhibits no bony tenderness, no swelling, no effusion, no crepitus, no deformity, no laceration, normal pulse and normal strength.       Left ankle: He exhibits decreased range of motion and swelling. He exhibits no ecchymosis, no deformity, no laceration and normal pulse. Tenderness. Lateral malleolus tenderness found.  Neurological: He is alert and oriented to person, place, and time. No cranial nerve deficit or sensory deficit. Gait abnormal. GCS eye subscore is 4. GCS verbal subscore is 5. GCS motor subscore is 6.  Abnormal gait  d/t pain/injury  Skin: Skin is warm and dry. No rash noted.  Psychiatric: He has a normal mood and affect. His speech is normal and behavior is normal.  Nursing note and vitals reviewed.   Urgent Care Course   Clinical Course    Procedures (including critical care time)  Labs Review Labs Reviewed - No data to display  Imaging Review Dg Ankle Complete Left  Result Date: 06/15/2016 CLINICAL DATA:  Per pt: yesterday getting out of truck stepped down hard, that's the only thing he can think of that caused the pain in the left ankle. Today can not bear weight onto the left foot and ankle. No prior injury to the left ankle. Patient is a diabetic EXAM: LEFT ANKLE COMPLETE - 3+ VIEW COMPARISON:  None. FINDINGS: There is no evidence of fracture, dislocation, or joint effusion. There is no evidence of arthropathy or other focal bone abnormality. Soft tissues are unremarkable. IMPRESSION: Negative. Electronically Signed   By: Franki Cabot M.D.   On: 06/15/2016 15:26         MDM   1. Moderate left ankle sprain, initial encounter     1510: Xray ordered  1555: Ace wrap ordered, pt has walker at home.Your xray was negative for active fracture/dislocation/etc. You have sprained your left ankle. Rest,ice, elevate, wear ace wrap, use walker at home. Follow up with PCP next week if pain persists, may need referralto Ortho at that time. Return to UC as needed. Take home meds as directed.   Pt is NV intact pre/post ace wrap.   Tori Milks, NP 25/85/27 [id number]

## 2016-06-15 NOTE — Discharge Instructions (Signed)
Your xray was negative for active fracture/dislocation/etc. You have sprained your left ankle. Rest,ice, elevate, wear ace wrap, use walker at home. Follow up with PCP next week if pain persists, may need referralto Ortho at that time. Return to UC as needed. Take home meds as directed.

## 2016-07-24 DIAGNOSIS — D631 Anemia in chronic kidney disease: Secondary | ICD-10-CM | POA: Diagnosis not present

## 2016-07-24 DIAGNOSIS — N183 Chronic kidney disease, stage 3 (moderate): Secondary | ICD-10-CM | POA: Diagnosis not present

## 2016-07-24 DIAGNOSIS — N2581 Secondary hyperparathyroidism of renal origin: Secondary | ICD-10-CM | POA: Diagnosis not present

## 2016-07-24 DIAGNOSIS — Z23 Encounter for immunization: Secondary | ICD-10-CM | POA: Diagnosis not present

## 2016-07-25 DIAGNOSIS — M109 Gout, unspecified: Secondary | ICD-10-CM | POA: Diagnosis not present

## 2016-07-25 DIAGNOSIS — E1143 Type 2 diabetes mellitus with diabetic autonomic (poly)neuropathy: Secondary | ICD-10-CM | POA: Diagnosis not present

## 2016-07-25 DIAGNOSIS — Z1389 Encounter for screening for other disorder: Secondary | ICD-10-CM | POA: Diagnosis not present

## 2016-07-25 DIAGNOSIS — D638 Anemia in other chronic diseases classified elsewhere: Secondary | ICD-10-CM | POA: Diagnosis not present

## 2016-07-25 DIAGNOSIS — M25572 Pain in left ankle and joints of left foot: Secondary | ICD-10-CM | POA: Diagnosis not present

## 2016-07-25 DIAGNOSIS — Z79899 Other long term (current) drug therapy: Secondary | ICD-10-CM | POA: Diagnosis not present

## 2016-08-02 ENCOUNTER — Ambulatory Visit (INDEPENDENT_AMBULATORY_CARE_PROVIDER_SITE_OTHER): Payer: Medicare Other | Admitting: Neurology

## 2016-08-02 ENCOUNTER — Encounter: Payer: Self-pay | Admitting: Neurology

## 2016-08-02 VITALS — BP 103/61 | HR 76 | Ht 70.0 in | Wt 236.8 lb

## 2016-08-02 DIAGNOSIS — Z794 Long term (current) use of insulin: Secondary | ICD-10-CM | POA: Diagnosis not present

## 2016-08-02 DIAGNOSIS — I6340 Cerebral infarction due to embolism of unspecified cerebral artery: Secondary | ICD-10-CM | POA: Diagnosis not present

## 2016-08-02 DIAGNOSIS — I10 Essential (primary) hypertension: Secondary | ICD-10-CM

## 2016-08-02 DIAGNOSIS — E785 Hyperlipidemia, unspecified: Secondary | ICD-10-CM | POA: Diagnosis not present

## 2016-08-02 DIAGNOSIS — I671 Cerebral aneurysm, nonruptured: Secondary | ICD-10-CM

## 2016-08-02 DIAGNOSIS — I4819 Other persistent atrial fibrillation: Secondary | ICD-10-CM

## 2016-08-02 DIAGNOSIS — E1159 Type 2 diabetes mellitus with other circulatory complications: Secondary | ICD-10-CM | POA: Diagnosis not present

## 2016-08-02 DIAGNOSIS — I481 Persistent atrial fibrillation: Secondary | ICD-10-CM

## 2016-08-02 NOTE — Progress Notes (Signed)
STROKE NEUROLOGY FOLLOW UP NOTE  NAME: Barry Haley DOB: 1945-12-15  REASON FOR VISIT: stroke follow up HISTORY FROM: chart and pt  Today we had the pleasure of seeing Barry Haley in follow-up at our Neurology Clinic. Pt was accompanied by no one.   History Summary For detailed info please refer to my note on 04/16/14.  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 In 2014, he again developed sepsis with bacteremia and TEE showed endocarditis. He had MV and TV repair in [facility]. Recently developed CHF and followed up with Dr. Bridgett Larsson in [facility] cardiology. He started to have left hemianopia and left visual disturbance mid August and concerns for a new stroke. During last clinic visit, I ordered MRI and TEE and EEG. However, before these being done, he was admitted to [facility] due to worsening generalized weakness and confusion. Found to have DM and DKA and A1C 13.7 as well as AKI. He was started on insulin drip and then subq. His MRI done should no acute stroke and TEE showed PFO and EF 35-40% without vegetations, EEG negative for seizure. His visual disturbance was considered as recrudescence of previous right occipital hemorrhagic infarct in 2011. He was continued on ASA and sent to CIR.   05/27/14 follow up - the patient has been doing well. He has OT to follow twice a week. His visual disturbance is gone. Blood sugar at home 110-120s. BP stable today 125/54. He is on insulin subq for DM control. He has PCP to see on 10/7 and cardiologist to f/u on 06/10/14. 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   09/27/14 follow up - pt was doing OK with neurology symptoms. However, he was admitted in 06/2014 for CHF exacerbation. He was resumed on lasix and much improved. He currently following up with his cardiology in [place]. His  ASA was increased to 325mg . His BP today in clinic was 140/49. Otherwise, he does not have neuro complains. Dr. Cleotilde Neer office has contacted him for appointment but he hold it off due to CHF exacerbation at that time.   02/01/15 follow up - pt has been doing well. 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 Pt tolerate well and procedure successful. Currently, he is on ASA and plavix. And Plan is to continue plavix for 6 months and then repeat cerebral angio. Pt BP today 136/65 and he stated that his glucose up and down at home but recent A1C was 7.6, significantly improved from before. No other complains.   08/03/15 follow up - pt has been doing well. He followed with Dr. Kathyrn Sheriff for aneurysm s/p pipeline stent and seems doing well, his dual antiplatelet was changed to ASA 325mg  alone. He had left middle finger gout earlier this month and was treated surgically. He stated that his last A1C was 6.6. He checks BP at home around 125/70. And his glucose at home around 115-118. He has appointment in 2 days with GI for EGD and colonoscopy for anemia work up.  01/31/16 follow up - pt has been doing well from stroke standpoint. No recurrent stroke like symptoms. As per pt, he had anemia work up with GI, had EGD/colonoscopy, both negative. He followed with Dr. Kathyrn Sheriff and was told aneurysm was repaired well. He had cardiology follow up and found to  have afib. However, due to elevated Cre, he was still on ASA and not on DOAC yet. He is going to see nephrology next week for evaluation. His glucose still not in good control. After his dental work, his glucose super high and was sent to ER received insulin treatment. His home dose insulin was increased after. Today BP 108/66.   Interval History During the interval time, pt has been doing well from stroke standpoint. He has been on eliquis 5mg  bid. His ASA was decreased from 325mg  to 81mg . As per pt, he has no  more nose bleed since the change. His glucose still fluctuates and he is following with PCP for glucose control. His BP at home 110s/70s and 120s in doctors office but today only 103/61.   REVIEW OF SYSTEMS: Full 14 system review of systems performed and notable only for those listed below and in HPI above, all others are negative:  Constitutional: N/A  Cardiovascular: murmur  Ear/Nose/Throat: N/A  Skin:  Eyes: N/A  Respiratory:   Gastroitestinal: N/A  Genitourinary: N/A Hematology/Lymphatic:  Endocrine:  Musculoskeletal:  Allergy/Immunology:   Neurological: walking difficulty, memory loss Psychiatric:  Sleep: restless leg, apnea  The following represents the patient's updated allergies and side effects list: Allergies  Allergen Reactions  . Hctz [Hydrochlorothiazide] Other (See Comments)    Renal insufficiency  . Niaspan [Niacin Er] Itching  . Zolpidem Anxiety and Other (See Comments)    Bad dreams Bad dreams    Labs since last visit of relevance include the following: Results for orders placed or performed during the hospital encounter of 07/13/15  Anaerobic culture  Result Value Ref Range   Specimen Description ABSCESS LEFT FINGER    Special Requests NONE    Gram Stain      FEW WBC PRESENT,BOTH PMN AND MONONUCLEAR NO SQUAMOUS EPITHELIAL CELLS SEEN NO ORGANISMS SEEN Performed at [facility]    Culture      NO ANAEROBES ISOLATED Performed at [facility]    Report Status 07/18/2015 FINAL   Culture, routine-abscess  Result Value Ref Range   Specimen Description ABSCESS LEFT FINGER    Special Requests NONE    Gram Stain      FEW WBC PRESENT,BOTH PMN AND MONONUCLEAR NO SQUAMOUS EPITHELIAL CELLS SEEN NO ORGANISMS SEEN Performed at [facility]    Culture      NO GROWTH 3 DAYS Performed at [facility]    Report Status 07/17/2015 FINAL   Comprehensive metabolic panel  Result Value Ref Range   Sodium 140 135 - 145 mmol/L    Potassium 4.0 3.5 - 5.1 mmol/L   Chloride 108 101 - 111 mmol/L   CO2 26 22 - 32 mmol/L   Glucose, Bld 99 65 - 99 mg/dL   BUN 23 (H) 6 - 20 mg/dL   Creatinine, Ser 1.52 (H) 0.61 - 1.24 mg/dL   Calcium 9.0 8.9 - 10.3 mg/dL   Total Protein 7.3 6.5 - 8.1 g/dL   Albumin 3.4 (L) 3.5 - 5.0 g/dL   AST 27 15 - 41 U/L   ALT 40 17 - 63 U/L   Alkaline Phosphatase 128 (H) 38 - 126 U/L   Total Bilirubin 0.4 0.3 - 1.2 mg/dL   GFR calc non Af Amer 45 (L) >60 mL/min   GFR calc Af Amer 52 (L) >60 mL/min   Anion gap 6 5 - 15  CBC with Differential  Result Value Ref Range   WBC 6.4 4.0 -  10.5 K/uL   RBC 4.79 4.22 - 5.81 MIL/uL   Hemoglobin 12.9 (L) 13.0 - 17.0 g/dL   HCT 39.4 39.0 - 52.0 %   MCV 82.3 78.0 - 100.0 fL   MCH 26.9 26.0 - 34.0 pg   MCHC 32.7 30.0 - 36.0 g/dL   RDW 14.2 11.5 - 15.5 %   Platelets 144 (L) 150 - 400 K/uL   Neutrophils Relative % 68 %   Neutro Abs 4.3 1.7 - 7.7 K/uL   Lymphocytes Relative 19 %   Lymphs Abs 1.2 0.7 - 4.0 K/uL   Monocytes Relative 6 %   Monocytes Absolute 0.4 0.1 - 1.0 K/uL   Eosinophils Relative 7 %   Eosinophils Absolute 0.4 0.0 - 0.7 K/uL   Basophils Relative 0 %   Basophils Absolute 0.0 0.0 - 0.1 K/uL  Uric acid  Result Value Ref Range   Uric Acid, Serum 11.6 (H) 4.4 - 7.6 mg/dL  Glucose, capillary  Result Value Ref Range   Glucose-Capillary 70 65 - 99 mg/dL  CBC  Result Value Ref Range   WBC 6.3 4.0 - 10.5 K/uL   RBC 4.27 4.22 - 5.81 MIL/uL   Hemoglobin 11.9 (L) 13.0 - 17.0 g/dL   HCT 35.4 (L) 39.0 - 52.0 %   MCV 82.9 78.0 - 100.0 fL   MCH 27.9 26.0 - 34.0 pg   MCHC 33.6 30.0 - 36.0 g/dL   RDW 14.6 11.5 - 15.5 %   Platelets 122 (L) 150 - 400 K/uL  Basic metabolic panel  Result Value Ref Range   Sodium 136 135 - 145 mmol/L   Potassium 3.9 3.5 - 5.1 mmol/L   Chloride 105 101 - 111 mmol/L   CO2 26 22 - 32 mmol/L   Glucose, Bld 204 (H) 65 - 99 mg/dL   BUN 22 (H) 6 - 20 mg/dL   Creatinine, Ser 1.44 (H) 0.61 - 1.24 mg/dL   Calcium 8.6  (L) 8.9 - 10.3 mg/dL   GFR calc non Af Amer 48 (L) >60 mL/min   GFR calc Af Amer 56 (L) >60 mL/min   Anion gap 5 5 - 15  Glucose, capillary  Result Value Ref Range   Glucose-Capillary 138 (H) 65 - 99 mg/dL  Glucose, capillary  Result Value Ref Range   Glucose-Capillary 176 (H) 65 - 99 mg/dL  I-Stat CG4 Lactic Acid, ED  (not at [facility])  Result Value Ref Range   Lactic Acid, Venous 0.45 (L) 0.5 - 2.0 mmol/L  I-Stat CG4 Lactic Acid, ED  (not at [facility])  Result Value Ref Range   Lactic Acid, Venous 0.78 0.5 - 2.0 mmol/L    The neurologically relevant items on the patient's problem list were reviewed on today's visit.  Neurologic Examination  A problem focused neurological exam (12 or more points of the single system neurologic examination, vital signs counts as 1 point, cranial nerves count for 8 points) was performed.  Blood pressure 103/61, pulse 76, height 5\' 10"  (1.778 m), weight 236 lb 12.8 oz (107.4 kg).  General - Well nourished, well developed, in no apparent distress.  Ophthalmologic - Sharp disc margins OU.  Cardiovascular - irregularly irregular heart rate and rhythm.  Mental Status -  Level of arousal and orientation to time, place, and person were intact. Language including expression, naming, repetition, comprehension was assessed and found intact.  Cranial Nerves II - XII - II - Visual field intact OU. III, IV, VI - Extraocular movements intact. V -  Facial sensation intact bilaterally. VII - Facial movement intact bilaterally. VIII - Hearing & vestibular intact bilaterally. X - Palate elevates symmetrically. XI - Chin turning & shoulder shrug intact bilaterally. XII - Tongue protrusion intact.  Motor Strength - The patient's strength was normal in all extremities and pronator drift was absent.  Bulk was normal and fasciculations were absent.   Motor Tone - Muscle tone was assessed at the neck and appendages and was normal.  Reflexes - The patient's reflexes  were normal in all extremities and he had no pathological reflexes.  Sensory - Light touch, temperature/pinprick were assessed and were normal.    Coordination - The patient had normal movements in the hands and feet with no ataxia or dysmetria.  Tremor was absent.  Gait and Station - large based gait but steady.  Data reviewed: I personally reviewed the images and agree with the radiology interpretations.  Ct Head Wo Contrast  04/21/2014 IMPRESSION: 1. No acute intracranial abnormality. 2. Atrophy and chronic microvascular white matter ischemic changes.   CTA head and neck 11/13/10 1. 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 Stable anterior communicating artery aneurysm 2mm. 3. No acute or focal abnormality otherwise. 4. Remote encephalomalacia of the right occipital lobe. 5. Areas of remote encephalomalacia and calcification in the posterior left frontal lobe.  CUS 01/2013  This is an abnormal carotid duplex ultrasound with color flow imaging. .  There is 50-75% stenosis of the left ICA by Doppler criteria, although this  is likely at the low end of the range. No severe luminal narrowing noted,  although there is moderate plaque. The Vertebrals demonstrate foward flow  bilaterally . Insonation at multiple depths was attempted of the  [facility]. This is an abnormal Transcranial  Doppler Ultrasound examination. The right ACA velocity is slightly elevated,  but does not meet criteria for stenosis and most likely reflects hyperemia or  collateral flow. The resistance is increased in multiple vessels, which may  point to diffuse atherosclerosis or distal small vessel disease. However,  there is no evidence of stenosis, vasospasm, or flow diversion. There was no  previous study available for direct comparison.   CUS 04/2014 - 1-39% internal carotid artery stenosis bilaterally. Vertebral arteries are patent with antegrade flow.   TEE  - Impression: No vegetations seen EF 35-40%  + PFO ( markedly positive)  He is bradycardic and LV function is moderately reduced.  I have DCd his Diltiazem for now..  Transthoracic echo to be done.   TTE - Left ventricle: Systolic function was moderately to severely reduced. The estimated ejection fraction was in the range of 30% to 35%. - Aortic valve: No evidence of vegetation. - Mitral valve: No evidence of vegetation. - Tricuspid valve: No evidence of vegetation.   EEG - Summary This is a severely abnormal EEG due to the presence of severe background slowing indicative of bihemispheric dysfunction which is a nonspecific finding seen in a variety of degenerative, toxic, metabolic and ischemic etiologies. No definite epileptiform activity is noted.   MRI head and MRA neck and head 04/23/14 Negative for acute infarct Numerous areas of chronic hemorrhage in the brain. Question  hypertension versus cerebral amyloid.  Diffuse intracranial atherosclerotic disease without large vessel occlusion.  2 mm aneurysm anterior communicating artery.  Question severe stenosis proximal right common carotid artery versus  artifact. Favor artifact. Both carotid bifurcations are patent  bilaterally  Atherosclerotic disease in the proximal vertebral arteries. Focal  area of signal  loss in the proximal right vertebral artery may be  due to severe stenosis or artifact.   Component     Latest Ref Rng 04/16/2014 04/24/2014  Cholesterol, Total     100 - 199 mg/dL 142   Triglycerides     0 - 149 mg/dL 196 (H)   HDL     >39 mg/dL 25 (L)   VLDL Cholesterol Cal     5 - 40 mg/dL 39   LDL (calc)     0 - 99 mg/dL 78   Total CHOL/HDL Ratio     0.0 - 5.0 ratio units 5.7 (H)   TSH     0.450 - 4.500 uIU/mL 1.400   Free T4     0.82 - 1.77 ng/dL 1.56   Hemoglobin A1C     4.8 - 5.6 % 13.7 (H)   Estimated average glucose      346   Vitamin B-12     211 - 911 pg/mL  1640 (H)    Assessment: As you may  recall, he is a 70 y.o. 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 MRI negative for acute stroke, TEE no vegetation and EEG no seizure. His neuro presentation was thought to be recrudescence of his old right occipital stroke. He recovered well after glucose under control. On follow up, his neuro stable and no recurrent neuro symptoms. He has CHF and following with local cardiologist. He had pipeline stent for his right cavernous ICA aneurysm in 01/2015, currently on ASA and plavix. Followed up with Dr. Kathyrn Sheriff in 07/2015 showed good result and plavix discontinued. He had EGD/colonoscopy for anemia work up, but unrevealing. Cardiology found that pt has afib and now on eliquis 5mg  bid along with ASA 81mg . DM still not in good control. BP stable but today is slightly low.  Plan:  - continue ASA, eliquis and lipitor for stroke prevention.  - follow up with cardiology and nephrology  - check BP at home and record. If constantly high or low, please call PCP for medication adjustment.  - Follow up with your primary care physician for stroke risk factor modification. Recommend maintain blood pressure goal <130/80, diabetes with hemoglobin A1c goal below 6.5% and lipids with LDL cholesterol goal below 70 mg/dL.  - home exercise and healthy diet - follow up as needed.    No orders of the defined types were placed in this encounter.   Meds ordered this encounter  Medications  . IRON PO    Sig: Take by mouth.  . Ferrous Fumarate (HEMOCYTE - 106 MG FE) 324 (106 Fe) MG TABS tablet    Sig: Take by mouth.  Arne Cleveland 5 MG TABS tablet    Patient Instructions  - continue ASA, eliquis and lipitor for stroke prevention.  - follow up with cardiology and nephrology  - check  BP at home and record. If constantly high or low, please call PCP for medication adjustment.  - Follow up with your primary care physician for stroke risk factor modification. Recommend maintain blood pressure goal <130/80, diabetes with hemoglobin A1c goal below 6.5% and lipids with LDL cholesterol goal below 70 mg/dL.  - home exercise and healthy diet - follow up as needed.    Rosalin Hawking, MD PhD [facility] [address], [place], [place] [address] [phone]

## 2016-08-02 NOTE — Patient Instructions (Addendum)
-   continue ASA, eliquis and lipitor for stroke prevention.  - follow up with cardiology and nephrology  - check BP at home and record. If constantly high or low, please call PCP for medication adjustment.  - Follow up with your primary care physician for stroke risk factor modification. Recommend maintain blood pressure goal <130/80, diabetes with hemoglobin A1c goal below 6.5% and lipids with LDL cholesterol goal below 70 mg/dL.  - home exercise and healthy diet - follow up as needed.

## 2016-09-11 DIAGNOSIS — M109 Gout, unspecified: Secondary | ICD-10-CM | POA: Diagnosis not present

## 2016-10-10 DIAGNOSIS — M109 Gout, unspecified: Secondary | ICD-10-CM | POA: Diagnosis not present

## 2016-10-10 DIAGNOSIS — E1165 Type 2 diabetes mellitus with hyperglycemia: Secondary | ICD-10-CM | POA: Diagnosis not present

## 2016-10-10 DIAGNOSIS — Z1389 Encounter for screening for other disorder: Secondary | ICD-10-CM | POA: Diagnosis not present

## 2016-10-11 DIAGNOSIS — R972 Elevated prostate specific antigen [PSA]: Secondary | ICD-10-CM | POA: Diagnosis not present

## 2016-10-11 DIAGNOSIS — N401 Enlarged prostate with lower urinary tract symptoms: Secondary | ICD-10-CM | POA: Diagnosis not present

## 2016-11-07 DIAGNOSIS — M109 Gout, unspecified: Secondary | ICD-10-CM | POA: Diagnosis not present

## 2016-11-19 DIAGNOSIS — E118 Type 2 diabetes mellitus with unspecified complications: Secondary | ICD-10-CM | POA: Diagnosis not present

## 2016-11-19 DIAGNOSIS — Z87448 Personal history of other diseases of urinary system: Secondary | ICD-10-CM | POA: Diagnosis not present

## 2016-11-19 DIAGNOSIS — I10 Essential (primary) hypertension: Secondary | ICD-10-CM | POA: Diagnosis not present

## 2016-11-19 DIAGNOSIS — I4891 Unspecified atrial fibrillation: Secondary | ICD-10-CM | POA: Diagnosis not present

## 2016-11-19 DIAGNOSIS — Z9889 Other specified postprocedural states: Secondary | ICD-10-CM | POA: Diagnosis not present

## 2016-11-19 DIAGNOSIS — E785 Hyperlipidemia, unspecified: Secondary | ICD-10-CM | POA: Diagnosis not present

## 2016-12-05 DIAGNOSIS — Z6837 Body mass index (BMI) 37.0-37.9, adult: Secondary | ICD-10-CM | POA: Diagnosis not present

## 2016-12-05 DIAGNOSIS — E1165 Type 2 diabetes mellitus with hyperglycemia: Secondary | ICD-10-CM | POA: Diagnosis not present

## 2017-01-29 DIAGNOSIS — N183 Chronic kidney disease, stage 3 (moderate): Secondary | ICD-10-CM | POA: Diagnosis not present

## 2017-01-29 DIAGNOSIS — N2581 Secondary hyperparathyroidism of renal origin: Secondary | ICD-10-CM | POA: Diagnosis not present

## 2017-01-29 DIAGNOSIS — D631 Anemia in chronic kidney disease: Secondary | ICD-10-CM | POA: Diagnosis not present

## 2017-01-29 DIAGNOSIS — Z6835 Body mass index (BMI) 35.0-35.9, adult: Secondary | ICD-10-CM | POA: Diagnosis not present

## 2017-03-05 DIAGNOSIS — E1165 Type 2 diabetes mellitus with hyperglycemia: Secondary | ICD-10-CM | POA: Diagnosis not present

## 2017-03-08 DIAGNOSIS — R945 Abnormal results of liver function studies: Secondary | ICD-10-CM | POA: Diagnosis not present

## 2017-03-08 DIAGNOSIS — D638 Anemia in other chronic diseases classified elsewhere: Secondary | ICD-10-CM | POA: Diagnosis not present

## 2017-03-08 DIAGNOSIS — R0609 Other forms of dyspnea: Secondary | ICD-10-CM | POA: Diagnosis not present

## 2017-03-08 DIAGNOSIS — Z6837 Body mass index (BMI) 37.0-37.9, adult: Secondary | ICD-10-CM | POA: Diagnosis not present

## 2017-03-08 DIAGNOSIS — Z1389 Encounter for screening for other disorder: Secondary | ICD-10-CM | POA: Diagnosis not present

## 2017-03-08 DIAGNOSIS — E1165 Type 2 diabetes mellitus with hyperglycemia: Secondary | ICD-10-CM | POA: Diagnosis not present

## 2017-03-15 DIAGNOSIS — R7989 Other specified abnormal findings of blood chemistry: Secondary | ICD-10-CM | POA: Diagnosis not present

## 2017-03-15 DIAGNOSIS — R945 Abnormal results of liver function studies: Secondary | ICD-10-CM | POA: Diagnosis not present

## 2017-04-10 DIAGNOSIS — R972 Elevated prostate specific antigen [PSA]: Secondary | ICD-10-CM | POA: Diagnosis not present

## 2017-04-10 DIAGNOSIS — N401 Enlarged prostate with lower urinary tract symptoms: Secondary | ICD-10-CM | POA: Diagnosis not present

## 2017-05-16 DIAGNOSIS — E1165 Type 2 diabetes mellitus with hyperglycemia: Secondary | ICD-10-CM | POA: Diagnosis not present

## 2017-05-22 DIAGNOSIS — E1165 Type 2 diabetes mellitus with hyperglycemia: Secondary | ICD-10-CM | POA: Diagnosis not present

## 2017-05-22 DIAGNOSIS — Z6836 Body mass index (BMI) 36.0-36.9, adult: Secondary | ICD-10-CM | POA: Diagnosis not present

## 2017-05-22 DIAGNOSIS — Z23 Encounter for immunization: Secondary | ICD-10-CM | POA: Diagnosis not present

## 2017-05-22 DIAGNOSIS — Z0000 Encounter for general adult medical examination without abnormal findings: Secondary | ICD-10-CM | POA: Diagnosis not present

## 2017-05-24 ENCOUNTER — Telehealth: Payer: Self-pay

## 2017-05-24 NOTE — Telephone Encounter (Signed)
Attempted to contact pt to make appt if he plans to continue his care with Dr. Geraldo Pitter. Recent OV and labs has been sent form PCP. RRR Has not seen pt since 05/2016.  LM to call back.

## 2017-07-07 ENCOUNTER — Other Ambulatory Visit: Payer: Self-pay | Admitting: Cardiology

## 2017-07-09 ENCOUNTER — Encounter: Payer: Self-pay | Admitting: Cardiology

## 2017-07-09 ENCOUNTER — Ambulatory Visit (INDEPENDENT_AMBULATORY_CARE_PROVIDER_SITE_OTHER): Payer: Medicare Other | Admitting: Cardiology

## 2017-07-09 VITALS — BP 128/58 | HR 51 | Ht 70.0 in | Wt 235.0 lb

## 2017-07-09 DIAGNOSIS — Z9889 Other specified postprocedural states: Secondary | ICD-10-CM

## 2017-07-09 DIAGNOSIS — I481 Persistent atrial fibrillation: Secondary | ICD-10-CM | POA: Diagnosis not present

## 2017-07-09 DIAGNOSIS — I10 Essential (primary) hypertension: Secondary | ICD-10-CM

## 2017-07-09 DIAGNOSIS — E782 Mixed hyperlipidemia: Secondary | ICD-10-CM | POA: Insufficient documentation

## 2017-07-09 DIAGNOSIS — E1121 Type 2 diabetes mellitus with diabetic nephropathy: Secondary | ICD-10-CM | POA: Diagnosis not present

## 2017-07-09 DIAGNOSIS — R0600 Dyspnea, unspecified: Secondary | ICD-10-CM

## 2017-07-09 DIAGNOSIS — I4819 Other persistent atrial fibrillation: Secondary | ICD-10-CM

## 2017-07-09 DIAGNOSIS — R0609 Other forms of dyspnea: Secondary | ICD-10-CM | POA: Diagnosis not present

## 2017-07-09 HISTORY — DX: Other specified postprocedural states: Z98.890

## 2017-07-09 HISTORY — DX: Dyspnea, unspecified: R06.00

## 2017-07-09 HISTORY — DX: Mixed hyperlipidemia: E78.2

## 2017-07-09 HISTORY — DX: Other forms of dyspnea: R06.09

## 2017-07-09 MED ORDER — ELIQUIS 5 MG PO TABS: 5.0000 mg | ORAL_TABLET | Freq: Two times a day (BID) | ORAL | 3 refills | Status: DC

## 2017-07-09 MED ORDER — NITROGLYCERIN 0.4 MG SL SUBL: 0.4000 mg | SUBLINGUAL_TABLET | SUBLINGUAL | 6 refills | Status: DC | PRN

## 2017-07-09 NOTE — Patient Instructions (Addendum)
Medication Instructions:  Your physician has recommended you make the following change in your medication:  START nitroglycerin as needed for chest pain REFILL for Eliquis was sent  Eliquis samples given  Labwork: None  Testing/Procedures: Your physician has requested that you have en exercise stress myoview. For further information please visit [email or web address]. Please follow instruction sheet, as given.    Follow-Up: Your physician recommends that you schedule a follow-up appointment in: 6 months   Any Other Special Instructions Will Be Listed Below (If Applicable).     If you need a refill on your cardiac medications before your next appointment, please call your pharmacy.   [place], RN, BSN

## 2017-07-09 NOTE — Progress Notes (Signed)
Cardiology Office Note:    Date:  07/09/2017   ID:  Barry Haley, DOB May 26, 1946, MRN [id number]  PCP:  Angelina Sheriff, MD  Cardiologist:  Jenean Lindau, MD   Referring MD: Angelina Sheriff, MD    ASSESSMENT:    1. Essential hypertension, benign   2. Persistent atrial fibrillation ([place])   3. Type 2 diabetes mellitus with diabetic nephropathy, without long-term current use of insulin ([place])   4. History of mitral valve repair   5. History of tricuspid valve repair   6. Mixed dyslipidemia    PLAN:    In order of problems listed above:  1. I discussed my findings with the patient extensively.  He shortness of breath is of concern and I will have him undergo echocardiographic testing for valvular heart disease and also exercise Cardiolite testing.  This will help me assess for any objective evidence of coronary artery disease. 2. His blood pressure stable 3. Diabetes mellitus and diet was discussed and this is followed by his primary care physician. 4. Sublingual nitroglycerin prescription was sent, its protocol and 911 protocol explained and the patient vocalized understanding questions were answered to the patient's satisfaction 5. Patient will be seen in follow-up appointment in 6 months or earlier if the patient has any concerns.    Medication Adjustments/Labs and Tests Ordered: Current medicines are reviewed at length with the patient today.  Concerns regarding medicines are outlined above.  No orders of the defined types were placed in this encounter.  No orders of the defined types were placed in this encounter.    History of Present Illness:    Barry Haley is a 71 y.o. male who is being seen today for the evaluation of shortness of breath on exertion at the request of Angelina Sheriff, MD.  This patient has been under my care in my previous practice.  He is here now to transfer his care and be established with my current practice.   Patient is a pleasant gentleman.  He has a history of atrial ablation, diabetes mellitus with renal insufficiency and diabetic nephropathy.  He is undergone mitral valve repair and tricuspid valve repair.  He denies any problems at this time except shortness of breath on exertion.  He tells me that this has been around for the past several weeks.  No chest pain orthopnea or PND.  At the time of my evaluation he is alert awake oriented in no distress.  Past Medical History:  Diagnosis Date  . Anemia 2016   evaluated by Dr Hinton Rao @ [place] [facility]  . Anemia, iron deficiency   . Aneurysm ([place]) 01/06/15   brain  . Anterior cerebral aneurysm 2016  . Anxiety state, unspecified   . Benign prostatic hyperplasia (BPH) with urinary urgency   . Cataracts, bilateral   . Cerebral aneurysm, nonruptured   . Chronic post-traumatic stress disorder (PTSD)   . CKD (chronic kidney disease)    Dr. Justin Mend, [facility]  . Diabetes ([place])    IDDm x 3 years  . Difficult intubation 01/2013   /[facility] 01/27/13: anterior cords; unsuccessful with MAC4 and MIL3, successful with #5 Glidescope  . Epistaxis   . GERD (gastroesophageal reflux disease)   . H/O epistaxis   . High cholesterol   . History of hiatal hernia   . Hx of migraines   . Hyperlipidemia   . Hypertension   . Meningitis 2010  . Neuropathy   .  Nocturia more than twice per night   . Other shock without mention of trauma   . Personal history of other diseases of digestive system   . Personal history of other disorder of urinary system   . Seborrheic dermatitis   . Shortness of breath dyspnea   . Sleep apnea    does not wear his cpap  . Unspecified cerebral artery occlusion with cerebral infarction     Past Surgical History:  Procedure Laterality Date  . APPENDECTOMY     child  . INFECTED SKIN DEBRIDEMENT Left 07/13/2015   left long finger  . MINIMALLY INVASIVE TRICUSPID VALVE REPAIR  01/27/2013  . mitral valve reconstruction  with cardiopulmonary bypass  01/27/2013    Current Medications: Current Meds  Medication Sig  . amLODipine (NORVASC) 10 MG tablet Take 1 tablet (10 mg total) by mouth daily.  Marland Kitchen aspirin EC 81 MG tablet Take 81 mg daily by mouth.  Marland Kitchen atorvastatin (LIPITOR) 10 MG tablet Take 10 mg by mouth at bedtime.  . citalopram (CELEXA) 20 MG tablet Take 20 mg by mouth daily.  Marland Kitchen ELIQUIS 5 MG TABS tablet   . ergocalciferol (VITAMIN D2) 50000 UNITS capsule Take 50,000 Units by mouth every 30 (thirty) days. No specific day  . esomeprazole (NEXIUM) 40 MG capsule Take 40 mg by mouth daily at 12 noon.  . furosemide (LASIX) 40 MG tablet Take 40 mg by mouth daily.  Marland Kitchen glimepiride (AMARYL) 4 MG tablet Take 4 mg 2 (two) times daily by mouth.   . hydrALAZINE (APRESOLINE) 10 MG tablet Take 10 mg by mouth 3 (three) times daily.   . Insulin Glargine (LANTUS SOLOSTAR) 100 UNIT/ML Solostar Pen Inject 45 Units daily into the skin.   . metoprolol succinate (TOPROL-XL) 50 MG 24 hr tablet Take 25 mg by mouth 2 (two) times daily. Take with or immediately following a meal.  . Omega-3 Fatty Acids (FISH OIL PO) Take 1 capsule by mouth daily.  . Tamsulosin HCl (FLOMAX) 0.4 MG CAPS Take 0.4 mg by mouth 2 (two) times daily.   . vitamin B-12 (CYANOCOBALAMIN) 1000 MCG tablet Take 1,000 mcg by mouth daily.       Allergies:   Hctz [hydrochlorothiazide]; Niaspan [niacin er]; and Zolpidem   Social History   Socioeconomic History  . Marital status: Married    Spouse name: Joelene Millin  . Number of children: 1  . Years of education: HS  . Highest education level: None  Social Needs  . Financial resource strain: None  . Food insecurity - worry: None  . Food insecurity - inability: None  . Transportation needs - medical: None  . Transportation needs - non-medical: None  Occupational History  . Occupation: Retired    Fish farm manager: [employer]  Tobacco Use  . Smoking status: Former Smoker    Packs/day: 2.50    Years: 40.00    Pack years:  100.00    Types: Cigarettes    Last attempt to quit: 10/04/2008    Years since quitting: 8.7  . Smokeless tobacco: Never Used  Substance and Sexual Activity  . Alcohol use: No    Alcohol/week: 0.0 oz    Comment: quit: 10/04/2008- rarely now  . Drug use: No  . Sexual activity: No  Other Topics Concern  . None  Social History Narrative   Patient is married Joelene Millin) and lives at home with his wife.   Patient has one adult child.   Patient is retired.   Patient has a high school  education.   Patient is right-handed.   Caffeine Use: Sodas- 3 cups   Patient lives at home with family.      Family History: The patient's family history includes Alzheimer's disease in his father; Cervical cancer in his mother; Diabetes in his brother, father, and mother; Heart failure in his mother.  ROS:   Please see the history of present illness.    All other systems reviewed and are negative.  EKGs/Labs/Other Studies Reviewed:    The following studies were reviewed today: I reviewed EKG and this revealed sinus rhythm with nonspecific ST changes   Recent Labs: No results found for requested labs within last 8760 hours.  Recent Lipid Panel    Component Value Date/Time   CHOL 142 04/16/2014 1604   TRIG 196 (H) 04/16/2014 1604   HDL 25 (L) 04/16/2014 1604   CHOLHDL 5.7 (H) 04/16/2014 1604   CHOLHDL 6.2 01/04/2010 0700   VLDL 22 01/04/2010 0700   LDLCALC 78 04/16/2014 1604    Physical Exam:    VS:  BP (!) 128/58   Pulse (!) 51   Ht 5\' 10"  (1.778 m)   Wt 235 lb (106.6 kg)   SpO2 98%   BMI 33.72 kg/m     Wt Readings from Last 3 Encounters:  07/09/17 235 lb (106.6 kg)  08/02/16 236 lb 12.8 oz (107.4 kg)  01/31/16 228 lb 9.6 oz (103.7 kg)     GEN: Patient is in no acute distress HEENT: Normal NECK: No JVD; No carotid bruits LYMPHATICS: No lymphadenopathy CARDIAC: S1 S2 regular, 2/6 systolic murmur at the apex. RESPIRATORY:  Clear to auscultation without rales, wheezing or rhonchi   ABDOMEN: Soft, non-tender, non-distended MUSCULOSKELETAL:  No edema; No deformity  SKIN: Warm and dry NEUROLOGIC:  Alert and oriented x 3 PSYCHIATRIC:  Normal affect    Signed, Jenean Lindau, MD  07/09/2017 3:15 PM    [facility]

## 2017-07-18 ENCOUNTER — Ambulatory Visit (HOSPITAL_COMMUNITY): Payer: Medicare Other | Attending: Cardiology

## 2017-07-18 ENCOUNTER — Other Ambulatory Visit: Payer: Self-pay

## 2017-07-18 ENCOUNTER — Ambulatory Visit (HOSPITAL_BASED_OUTPATIENT_CLINIC_OR_DEPARTMENT_OTHER): Payer: Medicare Other

## 2017-07-18 ENCOUNTER — Telehealth: Payer: Self-pay | Admitting: Cardiology

## 2017-07-18 VITALS — Ht 70.0 in | Wt 235.0 lb

## 2017-07-18 DIAGNOSIS — R0609 Other forms of dyspnea: Secondary | ICD-10-CM

## 2017-07-18 DIAGNOSIS — E785 Hyperlipidemia, unspecified: Secondary | ICD-10-CM | POA: Diagnosis not present

## 2017-07-18 DIAGNOSIS — R9439 Abnormal result of other cardiovascular function study: Secondary | ICD-10-CM | POA: Diagnosis not present

## 2017-07-18 DIAGNOSIS — I4891 Unspecified atrial fibrillation: Secondary | ICD-10-CM | POA: Diagnosis not present

## 2017-07-18 DIAGNOSIS — Z9889 Other specified postprocedural states: Secondary | ICD-10-CM | POA: Diagnosis not present

## 2017-07-18 DIAGNOSIS — E119 Type 2 diabetes mellitus without complications: Secondary | ICD-10-CM | POA: Diagnosis not present

## 2017-07-18 DIAGNOSIS — I119 Hypertensive heart disease without heart failure: Secondary | ICD-10-CM | POA: Insufficient documentation

## 2017-07-18 LAB — MYOCARDIAL PERFUSION IMAGING
LHR: 0.38
Rest HR: 48 {beats}/min
SDS: 4
SRS: 6
SSS: 10
TID: 0.92

## 2017-07-18 MED ORDER — REGADENOSON 0.4 MG/5ML IV SOLN
0.4000 mg | Freq: Once | INTRAVENOUS | Status: AC
  Administered 2017-07-18: 0.4 mg via INTRAVENOUS

## 2017-07-18 MED ORDER — TECHNETIUM TC 99M TETROFOSMIN IV KIT
10.9000 | PACK | Freq: Once | INTRAVENOUS | Status: AC | PRN
  Administered 2017-07-18: 10.9 via INTRAVENOUS
  Filled 2017-07-18: qty 11

## 2017-07-18 MED ORDER — TECHNETIUM TC 99M TETROFOSMIN IV KIT
32.9000 | PACK | Freq: Once | INTRAVENOUS | Status: AC | PRN
  Administered 2017-07-18: 32.9 via INTRAVENOUS
  Filled 2017-07-18: qty 33

## 2017-07-18 NOTE — Telephone Encounter (Signed)
Pt states that he has Iron deficiency and that was taking Hemocyte Plus previously and would like to know if Dr. Geraldo Pitter could start him back on it again. I mentioned to the pt that we don't have any recent lab work and that is something we would need to see where he stands before starting a medication as such, and this is really something that should be managed by the PCP or a Hematologist. Pt states that he does have a Hematologist and would reach back out to them and see if he could get any help form them. Pt verbalized understanding to all of this and didn't have any further questions.

## 2017-07-18 NOTE — Telephone Encounter (Signed)
Returning your call and wants you to call him ib Hemocite Plus? To [facility] in [place]

## 2017-07-30 DIAGNOSIS — R945 Abnormal results of liver function studies: Secondary | ICD-10-CM | POA: Diagnosis not present

## 2017-07-30 DIAGNOSIS — Z79899 Other long term (current) drug therapy: Secondary | ICD-10-CM | POA: Diagnosis not present

## 2017-07-30 DIAGNOSIS — Z6838 Body mass index (BMI) 38.0-38.9, adult: Secondary | ICD-10-CM | POA: Diagnosis not present

## 2017-07-30 DIAGNOSIS — R5383 Other fatigue: Secondary | ICD-10-CM | POA: Diagnosis not present

## 2017-07-30 DIAGNOSIS — Z23 Encounter for immunization: Secondary | ICD-10-CM | POA: Diagnosis not present

## 2017-07-30 DIAGNOSIS — D638 Anemia in other chronic diseases classified elsewhere: Secondary | ICD-10-CM | POA: Diagnosis not present

## 2017-07-30 DIAGNOSIS — L209 Atopic dermatitis, unspecified: Secondary | ICD-10-CM | POA: Diagnosis not present

## 2017-07-30 DIAGNOSIS — D519 Vitamin B12 deficiency anemia, unspecified: Secondary | ICD-10-CM | POA: Diagnosis not present

## 2017-07-30 DIAGNOSIS — M109 Gout, unspecified: Secondary | ICD-10-CM | POA: Diagnosis not present

## 2017-07-30 DIAGNOSIS — E1165 Type 2 diabetes mellitus with hyperglycemia: Secondary | ICD-10-CM | POA: Diagnosis not present

## 2017-07-30 DIAGNOSIS — J984 Other disorders of lung: Secondary | ICD-10-CM | POA: Diagnosis not present

## 2017-08-02 DIAGNOSIS — Z6835 Body mass index (BMI) 35.0-35.9, adult: Secondary | ICD-10-CM | POA: Diagnosis not present

## 2017-08-02 DIAGNOSIS — D631 Anemia in chronic kidney disease: Secondary | ICD-10-CM | POA: Diagnosis not present

## 2017-08-02 DIAGNOSIS — N183 Chronic kidney disease, stage 3 (moderate): Secondary | ICD-10-CM | POA: Diagnosis not present

## 2017-08-02 DIAGNOSIS — Z23 Encounter for immunization: Secondary | ICD-10-CM | POA: Diagnosis not present

## 2017-08-02 DIAGNOSIS — N2581 Secondary hyperparathyroidism of renal origin: Secondary | ICD-10-CM | POA: Diagnosis not present

## 2017-08-07 DIAGNOSIS — Z122 Encounter for screening for malignant neoplasm of respiratory organs: Secondary | ICD-10-CM | POA: Diagnosis not present

## 2017-08-07 DIAGNOSIS — Z87891 Personal history of nicotine dependence: Secondary | ICD-10-CM | POA: Diagnosis not present

## 2017-08-07 DIAGNOSIS — I700 Atherosclerosis of aorta: Secondary | ICD-10-CM | POA: Diagnosis not present

## 2017-08-08 DIAGNOSIS — Z136 Encounter for screening for cardiovascular disorders: Secondary | ICD-10-CM | POA: Diagnosis not present

## 2017-08-08 DIAGNOSIS — Z87891 Personal history of nicotine dependence: Secondary | ICD-10-CM | POA: Diagnosis not present

## 2017-08-08 DIAGNOSIS — F17210 Nicotine dependence, cigarettes, uncomplicated: Secondary | ICD-10-CM | POA: Diagnosis not present

## 2017-08-13 DIAGNOSIS — K760 Fatty (change of) liver, not elsewhere classified: Secondary | ICD-10-CM | POA: Diagnosis not present

## 2017-08-13 DIAGNOSIS — K210 Gastro-esophageal reflux disease with esophagitis: Secondary | ICD-10-CM | POA: Diagnosis not present

## 2017-10-23 DIAGNOSIS — N401 Enlarged prostate with lower urinary tract symptoms: Secondary | ICD-10-CM | POA: Diagnosis not present

## 2017-10-23 DIAGNOSIS — R351 Nocturia: Secondary | ICD-10-CM | POA: Diagnosis not present

## 2017-10-30 DIAGNOSIS — D519 Vitamin B12 deficiency anemia, unspecified: Secondary | ICD-10-CM | POA: Diagnosis not present

## 2017-10-30 DIAGNOSIS — M109 Gout, unspecified: Secondary | ICD-10-CM | POA: Diagnosis not present

## 2017-10-30 DIAGNOSIS — E1165 Type 2 diabetes mellitus with hyperglycemia: Secondary | ICD-10-CM | POA: Diagnosis not present

## 2017-11-06 DIAGNOSIS — E1143 Type 2 diabetes mellitus with diabetic autonomic (poly)neuropathy: Secondary | ICD-10-CM | POA: Diagnosis not present

## 2017-11-06 DIAGNOSIS — Z9181 History of falling: Secondary | ICD-10-CM | POA: Diagnosis not present

## 2017-11-06 DIAGNOSIS — Z6837 Body mass index (BMI) 37.0-37.9, adult: Secondary | ICD-10-CM | POA: Diagnosis not present

## 2017-11-06 DIAGNOSIS — M109 Gout, unspecified: Secondary | ICD-10-CM | POA: Diagnosis not present

## 2017-11-14 DIAGNOSIS — H25043 Posterior subcapsular polar age-related cataract, bilateral: Secondary | ICD-10-CM | POA: Diagnosis not present

## 2017-11-14 DIAGNOSIS — H25813 Combined forms of age-related cataract, bilateral: Secondary | ICD-10-CM | POA: Diagnosis not present

## 2017-11-14 DIAGNOSIS — H35033 Hypertensive retinopathy, bilateral: Secondary | ICD-10-CM | POA: Diagnosis not present

## 2017-11-14 DIAGNOSIS — H2181 Floppy iris syndrome: Secondary | ICD-10-CM | POA: Diagnosis not present

## 2017-11-14 DIAGNOSIS — E119 Type 2 diabetes mellitus without complications: Secondary | ICD-10-CM | POA: Diagnosis not present

## 2017-11-27 DIAGNOSIS — H2513 Age-related nuclear cataract, bilateral: Secondary | ICD-10-CM | POA: Diagnosis not present

## 2017-11-27 DIAGNOSIS — H2512 Age-related nuclear cataract, left eye: Secondary | ICD-10-CM | POA: Diagnosis not present

## 2017-12-10 DIAGNOSIS — H2589 Other age-related cataract: Secondary | ICD-10-CM | POA: Diagnosis not present

## 2017-12-10 DIAGNOSIS — H2512 Age-related nuclear cataract, left eye: Secondary | ICD-10-CM | POA: Diagnosis not present

## 2017-12-10 DIAGNOSIS — H25812 Combined forms of age-related cataract, left eye: Secondary | ICD-10-CM | POA: Diagnosis not present

## 2017-12-10 DIAGNOSIS — H2181 Floppy iris syndrome: Secondary | ICD-10-CM | POA: Diagnosis not present

## 2017-12-19 DIAGNOSIS — H2511 Age-related nuclear cataract, right eye: Secondary | ICD-10-CM | POA: Diagnosis not present

## 2017-12-31 DIAGNOSIS — H25811 Combined forms of age-related cataract, right eye: Secondary | ICD-10-CM | POA: Diagnosis not present

## 2017-12-31 DIAGNOSIS — H2511 Age-related nuclear cataract, right eye: Secondary | ICD-10-CM | POA: Diagnosis not present

## 2017-12-31 DIAGNOSIS — H2181 Floppy iris syndrome: Secondary | ICD-10-CM | POA: Diagnosis not present

## 2018-01-22 DIAGNOSIS — J209 Acute bronchitis, unspecified: Secondary | ICD-10-CM | POA: Diagnosis not present

## 2018-03-11 DIAGNOSIS — Z1339 Encounter for screening examination for other mental health and behavioral disorders: Secondary | ICD-10-CM | POA: Diagnosis not present

## 2018-03-11 DIAGNOSIS — E1165 Type 2 diabetes mellitus with hyperglycemia: Secondary | ICD-10-CM | POA: Diagnosis not present

## 2018-03-11 DIAGNOSIS — M109 Gout, unspecified: Secondary | ICD-10-CM | POA: Diagnosis not present

## 2018-03-11 DIAGNOSIS — Z1331 Encounter for screening for depression: Secondary | ICD-10-CM | POA: Diagnosis not present

## 2018-03-17 ENCOUNTER — Ambulatory Visit (INDEPENDENT_AMBULATORY_CARE_PROVIDER_SITE_OTHER): Payer: Medicare Other | Admitting: Cardiology

## 2018-03-17 ENCOUNTER — Encounter: Payer: Self-pay | Admitting: Cardiology

## 2018-03-17 VITALS — BP 122/62 | HR 55 | Ht 70.0 in | Wt 239.0 lb

## 2018-03-17 DIAGNOSIS — I10 Essential (primary) hypertension: Secondary | ICD-10-CM

## 2018-03-17 DIAGNOSIS — I481 Persistent atrial fibrillation: Secondary | ICD-10-CM

## 2018-03-17 DIAGNOSIS — I5022 Chronic systolic (congestive) heart failure: Secondary | ICD-10-CM | POA: Diagnosis not present

## 2018-03-17 DIAGNOSIS — G4733 Obstructive sleep apnea (adult) (pediatric): Secondary | ICD-10-CM

## 2018-03-17 DIAGNOSIS — Z9889 Other specified postprocedural states: Secondary | ICD-10-CM

## 2018-03-17 DIAGNOSIS — E1100 Type 2 diabetes mellitus with hyperosmolarity without nonketotic hyperglycemic-hyperosmolar coma (NKHHC): Secondary | ICD-10-CM

## 2018-03-17 DIAGNOSIS — I4819 Other persistent atrial fibrillation: Secondary | ICD-10-CM

## 2018-03-17 NOTE — Patient Instructions (Addendum)

## 2018-03-17 NOTE — Progress Notes (Signed)
Cardiology Office Note:    Date:  03/17/2018   ID:  Barry Haley, DOB [birth year], MRN [id number]  PCP:  Angelina Sheriff, MD  Cardiologist:  Jenean Lindau, MD   Referring MD: Angelina Sheriff, MD    ASSESSMENT:    1. Chronic systolic CHF (congestive heart failure) ([place])   2. Essential hypertension, benign   3. Persistent atrial fibrillation ([place])   4. OSA (obstructive sleep apnea)   5. Type 2 diabetes mellitus with hyperosmolarity without coma, without long-term current use of insulin ([place])   6. S/P mitral valve repair   7. S/P tricuspid valve repair    PLAN:    In order of problems listed above:  1. Primary differential stressed with the patient.  Importance of compliance with diet and medication stressed and he vocalized understanding.  His blood work is managed by his primary care physician including lipids. 2. He appears stable from a cardiac standpoint.  I discussed diet to reduce weight.  Importance of regular exercise stressed and he vocalized understanding. 3. He will be back in 6 months or earlier if he has any questions or concerns.  Overall his cardiac status appears to be stable. 4. I discussed with the patient atrial fibrillation, disease process. Management and therapy including rate and rhythm control, anticoagulation benefits and potential risks were discussed extensively with the patient. Patient had multiple questions which were answered to patient's satisfaction. 5.  Medication Adjustments/Labs and Tests Ordered: Current medicines are reviewed at length with the patient today.  Concerns regarding medicines are outlined above.  No orders of the defined types were placed in this encounter.  No orders of the defined types were placed in this encounter.    No chief complaint on file.    History of Present Illness:    Barry Haley is a 72 y.o. male.  Patient has past medical history of essential hypertension.  He has undergone  mitral valve and tricuspid valve repair.  He has history of atrial fibrillation and is on anticoagulation.  He denies any problems at this time and takes care of activities of daily living.  No chest pain orthopnea or PND.  He leads a sedentary lifestyle and tells me that he has gained significant weight.  At the time of my evaluation, the patient is alert awake oriented and in no distress.  Past Medical History:  Diagnosis Date  . Anemia 2016   evaluated by Dr Hinton Rao @ [facility]  . Anemia, iron deficiency   . Aneurysm ([place]) 01/06/15   brain  . Anterior cerebral aneurysm 2016  . Anxiety state, unspecified   . Benign prostatic hyperplasia (BPH) with urinary urgency   . Cataracts, bilateral   . Cerebral aneurysm, nonruptured   . Chronic post-traumatic stress disorder (PTSD)   . CKD (chronic kidney disease)    Dr. Justin Mend, [facility]  . Diabetes ([place])    IDDm x 3 years  . Difficult intubation 01/2013   /[facility] 01/27/13: anterior cords; unsuccessful with MAC4 and MIL3, successful with #5 Glidescope  . Epistaxis   . GERD (gastroesophageal reflux disease)   . H/O epistaxis   . High cholesterol   . History of hiatal hernia   . Hx of migraines   . Hyperlipidemia   . Hypertension   . Meningitis 2010  . Neuropathy   . Nocturia more than twice per night   . Other shock without mention of trauma   . Personal  history of other diseases of digestive system   . Personal history of other disorder of urinary system   . Seborrheic dermatitis   . Shortness of breath dyspnea   . Sleep apnea    does not wear his cpap  . Unspecified cerebral artery occlusion with cerebral infarction     Past Surgical History:  Procedure Laterality Date  . APPENDECTOMY     child  . I&D EXTREMITY Left 07/13/2015   Procedure: IRRIGATION AND DEBRIDEMENT LEFT LONG FINGER;  Surgeon: Dayna Barker, MD;  Location: [facility];  Service: Plastics;  Laterality: Left;  . INFECTED SKIN DEBRIDEMENT Left  07/13/2015   left long finger  . MINIMALLY INVASIVE TRICUSPID VALVE REPAIR  01/27/2013  . mitral valve reconstruction with cardiopulmonary bypass  01/27/2013  . RADIOLOGY WITH ANESTHESIA N/A 04/23/2014   Procedure: RADIOLOGY WITH ANESTHESIA;  Surgeon: Medication Radiologist, MD;  Location: [facility];  Service: Radiology;  Laterality: N/A;  . RADIOLOGY WITH ANESTHESIA N/A 01/06/2015   Procedure: Pipeline Embolization;  Surgeon: Consuella Lose, MD;  Location: [place];  Service: Radiology;  Laterality: N/A;  . TEE WITHOUT CARDIOVERSION N/A 04/22/2014   Procedure: TRANSESOPHAGEAL ECHOCARDIOGRAM (TEE);  Surgeon: Thayer Headings, MD;  Location: [facility] ENDOSCOPY;  Service: Cardiovascular;  Laterality: N/A;    Current Medications: Current Meds  Medication Sig  . amLODipine (NORVASC) 10 MG tablet Take 1 tablet (10 mg total) by mouth daily.  Marland Kitchen aspirin EC 81 MG tablet Take 81 mg daily by mouth.  Marland Kitchen atorvastatin (LIPITOR) 10 MG tablet Take 10 mg by mouth at bedtime.  . citalopram (CELEXA) 20 MG tablet Take 20 mg by mouth daily.  Marland Kitchen ELIQUIS 5 MG TABS tablet Take 1 tablet (5 mg total) 2 (two) times daily by mouth.  . ergocalciferol (VITAMIN D2) 50000 UNITS capsule Take 50,000 Units by mouth every 30 (thirty) days. No specific day  . esomeprazole (NEXIUM) 40 MG capsule Take 40 mg by mouth daily at 12 noon.  . Fe Fum-FA-B Cmp-C-Zn-Mg-Mn-Cu (HEMOCYTE PLUS PO) Take 1 tablet by mouth daily.  . furosemide (LASIX) 40 MG tablet Take 40 mg by mouth daily.  Marland Kitchen glimepiride (AMARYL) 4 MG tablet Take 4 mg 2 (two) times daily by mouth.   . hydrALAZINE (APRESOLINE) 10 MG tablet Take 10 mg by mouth 3 (three) times daily.   . Insulin Glargine (LANTUS SOLOSTAR) 100 UNIT/ML Solostar Pen Inject 45 Units daily into the skin.   . Insulin Pen Needle (AURORA PEN NEEDLES) 29G X 12MM MISC 8 Units by Does not apply route daily.  . IRON PO Take by mouth.  . metoprolol succinate (TOPROL-XL) 50 MG 24 hr tablet Take 25 mg by mouth 2 (two) times  daily. Take with or immediately following a meal.  . Omega-3 Fatty Acids (FISH OIL PO) Take 1 capsule by mouth daily.  . Tamsulosin HCl (FLOMAX) 0.4 MG CAPS Take 0.4 mg by mouth 2 (two) times daily.   . vitamin B-12 (CYANOCOBALAMIN) 1000 MCG tablet Take 1,000 mcg by mouth daily.       Allergies:   Hctz [hydrochlorothiazide]; Niaspan [niacin er]; and Zolpidem   Social History   Socioeconomic History  . Marital status: Married    Spouse name: Joelene Millin  . Number of children: 1  . Years of education: HS  . Highest education level: Not on file  Occupational History  . Occupation: Retired    Fish farm manager: [employer]  Social Needs  . Financial resource strain: Not on file  . Food insecurity:  Worry: Not on file    Inability: Not on file  . Transportation needs:    Medical: Not on file    Non-medical: Not on file  Tobacco Use  . Smoking status: Former Smoker    Packs/day: 2.50    Years: 40.00    Pack years: 100.00    Types: Cigarettes    Last attempt to quit: 10/04/2008    Years since quitting: 9.4  . Smokeless tobacco: Never Used  Substance and Sexual Activity  . Alcohol use: No    Alcohol/week: 0.0 oz    Comment: quit: 10/04/2008- rarely now  . Drug use: No  . Sexual activity: Never  Lifestyle  . Physical activity:    Days per week: Not on file    Minutes per session: Not on file  . Stress: Not on file  Relationships  . Social connections:    Talks on phone: Not on file    Gets together: Not on file    Attends religious service: Not on file    Active member of club or organization: Not on file    Attends meetings of clubs or organizations: Not on file    Relationship status: Not on file  Other Topics Concern  . Not on file  Social History Narrative   Patient is married Joelene Millin) and lives at home with his wife.   Patient has one adult child.   Patient is retired.   Patient has a high school education.   Patient is right-handed.   Caffeine Use: Sodas- 3 cups   Patient  lives at home with family.      Family History: The patient's family history includes Alzheimer's disease in his father; Cervical cancer in his mother; Diabetes in his brother, father, and mother; Heart failure in his mother.  ROS:   Please see the history of present illness.    All other systems reviewed and are negative.  EKGs/Labs/Other Studies Reviewed:    The following studies were reviewed today: I discussed my findings with the patient at length.  EKG reveals sinus rhythm and nonspecific ST-T changes.   Recent Labs: No results found for requested labs within last 8760 hours.  Recent Lipid Panel    Component Value Date/Time   CHOL 142 04/16/2014 1604   TRIG 196 (H) 04/16/2014 1604   HDL 25 (L) 04/16/2014 1604   CHOLHDL 5.7 (H) 04/16/2014 1604   CHOLHDL 6.2 01/04/2010 0700   VLDL 22 01/04/2010 0700   LDLCALC 78 04/16/2014 1604    Physical Exam:    VS:  BP 122/62 (BP Location: Right Arm, Patient Position: Sitting, Cuff Size: Normal)   Pulse (!) 55   Ht 5\' 10"  (1.778 m)   Wt 239 lb (108.4 kg)   SpO2 98%   BMI 34.29 kg/m     Wt Readings from Last 3 Encounters:  03/17/18 239 lb (108.4 kg)  07/18/17 235 lb (106.6 kg)  07/09/17 235 lb (106.6 kg)     GEN: Patient is in no acute distress HEENT: Normal NECK: No JVD; No carotid bruits LYMPHATICS: No lymphadenopathy CARDIAC: Hear sounds regular, 2/6 systolic murmur at the apex. RESPIRATORY:  Clear to auscultation without rales, wheezing or rhonchi  ABDOMEN: Soft, non-tender, non-distended MUSCULOSKELETAL:  No edema; No deformity  SKIN: Warm and dry NEUROLOGIC:  Alert and oriented x 3 PSYCHIATRIC:  Normal affect   Signed, Jenean Lindau, MD  03/17/2018 1:13 PM    [facility]

## 2018-04-22 DIAGNOSIS — R972 Elevated prostate specific antigen [PSA]: Secondary | ICD-10-CM | POA: Diagnosis not present

## 2018-04-22 DIAGNOSIS — N401 Enlarged prostate with lower urinary tract symptoms: Secondary | ICD-10-CM | POA: Diagnosis not present

## 2018-06-03 DIAGNOSIS — E1122 Type 2 diabetes mellitus with diabetic chronic kidney disease: Secondary | ICD-10-CM | POA: Diagnosis not present

## 2018-06-03 DIAGNOSIS — M86232 Subacute osteomyelitis, left radius and ulna: Secondary | ICD-10-CM | POA: Diagnosis not present

## 2018-06-03 DIAGNOSIS — I671 Cerebral aneurysm, nonruptured: Secondary | ICD-10-CM | POA: Diagnosis not present

## 2018-06-03 DIAGNOSIS — N400 Enlarged prostate without lower urinary tract symptoms: Secondary | ICD-10-CM | POA: Diagnosis not present

## 2018-06-03 DIAGNOSIS — I639 Cerebral infarction, unspecified: Secondary | ICD-10-CM | POA: Diagnosis not present

## 2018-06-03 DIAGNOSIS — D631 Anemia in chronic kidney disease: Secondary | ICD-10-CM | POA: Diagnosis not present

## 2018-06-03 DIAGNOSIS — N2581 Secondary hyperparathyroidism of renal origin: Secondary | ICD-10-CM | POA: Diagnosis not present

## 2018-06-03 DIAGNOSIS — N057 Unspecified nephritic syndrome with diffuse crescentic glomerulonephritis: Secondary | ICD-10-CM | POA: Diagnosis not present

## 2018-06-03 DIAGNOSIS — N183 Chronic kidney disease, stage 3 (moderate): Secondary | ICD-10-CM | POA: Diagnosis not present

## 2018-06-03 DIAGNOSIS — Z8661 Personal history of infections of the central nervous system: Secondary | ICD-10-CM | POA: Diagnosis not present

## 2018-06-03 DIAGNOSIS — I509 Heart failure, unspecified: Secondary | ICD-10-CM | POA: Diagnosis not present

## 2018-06-03 DIAGNOSIS — Z9889 Other specified postprocedural states: Secondary | ICD-10-CM | POA: Diagnosis not present

## 2018-06-03 DIAGNOSIS — N189 Chronic kidney disease, unspecified: Secondary | ICD-10-CM | POA: Diagnosis not present

## 2018-06-05 DIAGNOSIS — Z794 Long term (current) use of insulin: Secondary | ICD-10-CM | POA: Diagnosis not present

## 2018-06-05 DIAGNOSIS — Z87891 Personal history of nicotine dependence: Secondary | ICD-10-CM | POA: Diagnosis not present

## 2018-06-05 DIAGNOSIS — Z23 Encounter for immunization: Secondary | ICD-10-CM | POA: Insufficient documentation

## 2018-06-05 DIAGNOSIS — Z8739 Personal history of other diseases of the musculoskeletal system and connective tissue: Secondary | ICD-10-CM | POA: Insufficient documentation

## 2018-06-05 DIAGNOSIS — Z0001 Encounter for general adult medical examination with abnormal findings: Secondary | ICD-10-CM | POA: Diagnosis not present

## 2018-06-05 DIAGNOSIS — E1165 Type 2 diabetes mellitus with hyperglycemia: Secondary | ICD-10-CM | POA: Diagnosis not present

## 2018-06-05 DIAGNOSIS — I482 Chronic atrial fibrillation, unspecified: Secondary | ICD-10-CM | POA: Diagnosis not present

## 2018-06-05 HISTORY — DX: Encounter for immunization: Z23

## 2018-06-05 HISTORY — DX: Personal history of other diseases of the musculoskeletal system and connective tissue: Z87.39

## 2018-07-06 ENCOUNTER — Other Ambulatory Visit: Payer: Self-pay | Admitting: Cardiology

## 2018-07-22 DIAGNOSIS — H7292 Unspecified perforation of tympanic membrane, left ear: Secondary | ICD-10-CM

## 2018-07-22 HISTORY — DX: Unspecified perforation of tympanic membrane, left ear: H72.92

## 2018-07-29 DIAGNOSIS — Z8669 Personal history of other diseases of the nervous system and sense organs: Secondary | ICD-10-CM | POA: Diagnosis not present

## 2018-08-08 DIAGNOSIS — R0600 Dyspnea, unspecified: Secondary | ICD-10-CM | POA: Diagnosis not present

## 2018-08-08 DIAGNOSIS — I509 Heart failure, unspecified: Secondary | ICD-10-CM | POA: Diagnosis not present

## 2018-08-08 DIAGNOSIS — R0602 Shortness of breath: Secondary | ICD-10-CM | POA: Diagnosis not present

## 2018-08-10 DIAGNOSIS — I509 Heart failure, unspecified: Secondary | ICD-10-CM | POA: Diagnosis not present

## 2018-08-14 DIAGNOSIS — Z8673 Personal history of transient ischemic attack (TIA), and cerebral infarction without residual deficits: Secondary | ICD-10-CM | POA: Diagnosis not present

## 2018-08-14 DIAGNOSIS — F329 Major depressive disorder, single episode, unspecified: Secondary | ICD-10-CM | POA: Diagnosis not present

## 2018-08-14 DIAGNOSIS — I499 Cardiac arrhythmia, unspecified: Secondary | ICD-10-CM

## 2018-08-14 DIAGNOSIS — N400 Enlarged prostate without lower urinary tract symptoms: Secondary | ICD-10-CM | POA: Diagnosis present

## 2018-08-14 DIAGNOSIS — K219 Gastro-esophageal reflux disease without esophagitis: Secondary | ICD-10-CM

## 2018-08-14 DIAGNOSIS — Z7901 Long term (current) use of anticoagulants: Secondary | ICD-10-CM | POA: Diagnosis not present

## 2018-08-14 DIAGNOSIS — I5033 Acute on chronic diastolic (congestive) heart failure: Secondary | ICD-10-CM | POA: Diagnosis not present

## 2018-08-14 DIAGNOSIS — H2181 Floppy iris syndrome: Secondary | ICD-10-CM | POA: Diagnosis not present

## 2018-08-14 DIAGNOSIS — I272 Pulmonary hypertension, unspecified: Secondary | ICD-10-CM | POA: Diagnosis present

## 2018-08-14 DIAGNOSIS — Z7982 Long term (current) use of aspirin: Secondary | ICD-10-CM | POA: Diagnosis not present

## 2018-08-14 DIAGNOSIS — E1122 Type 2 diabetes mellitus with diabetic chronic kidney disease: Secondary | ICD-10-CM | POA: Diagnosis present

## 2018-08-14 DIAGNOSIS — D638 Anemia in other chronic diseases classified elsewhere: Secondary | ICD-10-CM | POA: Diagnosis not present

## 2018-08-14 DIAGNOSIS — I482 Chronic atrial fibrillation, unspecified: Secondary | ICD-10-CM | POA: Diagnosis not present

## 2018-08-14 DIAGNOSIS — Z7984 Long term (current) use of oral hypoglycemic drugs: Secondary | ICD-10-CM | POA: Diagnosis not present

## 2018-08-14 DIAGNOSIS — E785 Hyperlipidemia, unspecified: Secondary | ICD-10-CM | POA: Diagnosis not present

## 2018-08-14 DIAGNOSIS — G4733 Obstructive sleep apnea (adult) (pediatric): Secondary | ICD-10-CM | POA: Diagnosis present

## 2018-08-14 DIAGNOSIS — Z886 Allergy status to analgesic agent status: Secondary | ICD-10-CM | POA: Diagnosis not present

## 2018-08-14 DIAGNOSIS — H35033 Hypertensive retinopathy, bilateral: Secondary | ICD-10-CM | POA: Diagnosis not present

## 2018-08-14 DIAGNOSIS — F419 Anxiety disorder, unspecified: Secondary | ICD-10-CM | POA: Diagnosis not present

## 2018-08-14 DIAGNOSIS — R0602 Shortness of breath: Secondary | ICD-10-CM | POA: Diagnosis not present

## 2018-08-14 DIAGNOSIS — Z87891 Personal history of nicotine dependence: Secondary | ICD-10-CM | POA: Diagnosis not present

## 2018-08-14 DIAGNOSIS — I5043 Acute on chronic combined systolic (congestive) and diastolic (congestive) heart failure: Secondary | ICD-10-CM | POA: Diagnosis not present

## 2018-08-14 DIAGNOSIS — R0600 Dyspnea, unspecified: Secondary | ICD-10-CM | POA: Diagnosis not present

## 2018-08-14 DIAGNOSIS — I081 Rheumatic disorders of both mitral and tricuspid valves: Secondary | ICD-10-CM | POA: Diagnosis present

## 2018-08-14 DIAGNOSIS — N183 Chronic kidney disease, stage 3 (moderate): Secondary | ICD-10-CM | POA: Diagnosis not present

## 2018-08-14 DIAGNOSIS — I110 Hypertensive heart disease with heart failure: Secondary | ICD-10-CM | POA: Diagnosis not present

## 2018-08-14 DIAGNOSIS — Z794 Long term (current) use of insulin: Secondary | ICD-10-CM | POA: Diagnosis not present

## 2018-08-14 DIAGNOSIS — E119 Type 2 diabetes mellitus without complications: Secondary | ICD-10-CM | POA: Diagnosis not present

## 2018-08-14 DIAGNOSIS — I130 Hypertensive heart and chronic kidney disease with heart failure and stage 1 through stage 4 chronic kidney disease, or unspecified chronic kidney disease: Secondary | ICD-10-CM | POA: Diagnosis not present

## 2018-08-14 DIAGNOSIS — Z961 Presence of intraocular lens: Secondary | ICD-10-CM | POA: Diagnosis not present

## 2018-08-14 DIAGNOSIS — M109 Gout, unspecified: Secondary | ICD-10-CM | POA: Diagnosis not present

## 2018-08-14 DIAGNOSIS — Z79899 Other long term (current) drug therapy: Secondary | ICD-10-CM | POA: Diagnosis not present

## 2018-08-14 DIAGNOSIS — Z888 Allergy status to other drugs, medicaments and biological substances status: Secondary | ICD-10-CM | POA: Diagnosis not present

## 2018-08-14 DIAGNOSIS — E1165 Type 2 diabetes mellitus with hyperglycemia: Secondary | ICD-10-CM | POA: Diagnosis not present

## 2018-08-14 HISTORY — DX: Cardiac arrhythmia, unspecified: I49.9

## 2018-08-14 HISTORY — DX: Gastro-esophageal reflux disease without esophagitis: K21.9

## 2018-08-17 DIAGNOSIS — R9431 Abnormal electrocardiogram [ECG] [EKG]: Secondary | ICD-10-CM | POA: Diagnosis not present

## 2018-08-17 DIAGNOSIS — I493 Ventricular premature depolarization: Secondary | ICD-10-CM | POA: Diagnosis not present

## 2018-09-05 DIAGNOSIS — Z125 Encounter for screening for malignant neoplasm of prostate: Secondary | ICD-10-CM

## 2018-09-05 DIAGNOSIS — N184 Chronic kidney disease, stage 4 (severe): Secondary | ICD-10-CM | POA: Insufficient documentation

## 2018-09-05 DIAGNOSIS — Z794 Long term (current) use of insulin: Secondary | ICD-10-CM | POA: Diagnosis not present

## 2018-09-05 DIAGNOSIS — I482 Chronic atrial fibrillation, unspecified: Secondary | ICD-10-CM | POA: Diagnosis not present

## 2018-09-05 DIAGNOSIS — I129 Hypertensive chronic kidney disease with stage 1 through stage 4 chronic kidney disease, or unspecified chronic kidney disease: Secondary | ICD-10-CM | POA: Diagnosis not present

## 2018-09-05 DIAGNOSIS — N401 Enlarged prostate with lower urinary tract symptoms: Secondary | ICD-10-CM

## 2018-09-05 DIAGNOSIS — I5042 Chronic combined systolic (congestive) and diastolic (congestive) heart failure: Secondary | ICD-10-CM | POA: Diagnosis not present

## 2018-09-05 DIAGNOSIS — N138 Other obstructive and reflux uropathy: Secondary | ICD-10-CM

## 2018-09-05 DIAGNOSIS — Z8739 Personal history of other diseases of the musculoskeletal system and connective tissue: Secondary | ICD-10-CM | POA: Diagnosis not present

## 2018-09-05 DIAGNOSIS — E1122 Type 2 diabetes mellitus with diabetic chronic kidney disease: Secondary | ICD-10-CM | POA: Diagnosis not present

## 2018-09-05 DIAGNOSIS — E559 Vitamin D deficiency, unspecified: Secondary | ICD-10-CM | POA: Diagnosis not present

## 2018-09-05 DIAGNOSIS — F419 Anxiety disorder, unspecified: Secondary | ICD-10-CM

## 2018-09-05 DIAGNOSIS — E782 Mixed hyperlipidemia: Secondary | ICD-10-CM | POA: Diagnosis not present

## 2018-09-05 DIAGNOSIS — N183 Chronic kidney disease, stage 3 (moderate): Secondary | ICD-10-CM | POA: Diagnosis not present

## 2018-09-05 DIAGNOSIS — E1165 Type 2 diabetes mellitus with hyperglycemia: Secondary | ICD-10-CM | POA: Diagnosis not present

## 2018-09-05 DIAGNOSIS — I5043 Acute on chronic combined systolic (congestive) and diastolic (congestive) heart failure: Secondary | ICD-10-CM | POA: Diagnosis not present

## 2018-09-05 DIAGNOSIS — I509 Heart failure, unspecified: Secondary | ICD-10-CM | POA: Insufficient documentation

## 2018-09-05 DIAGNOSIS — I1310 Hypertensive heart and chronic kidney disease without heart failure, with stage 1 through stage 4 chronic kidney disease, or unspecified chronic kidney disease: Secondary | ICD-10-CM | POA: Diagnosis not present

## 2018-09-05 DIAGNOSIS — K219 Gastro-esophageal reflux disease without esophagitis: Secondary | ICD-10-CM | POA: Diagnosis not present

## 2018-09-05 HISTORY — DX: Heart failure, unspecified: I50.9

## 2018-09-05 HISTORY — DX: Benign prostatic hyperplasia with lower urinary tract symptoms: N40.1

## 2018-09-05 HISTORY — DX: Encounter for screening for malignant neoplasm of prostate: Z12.5

## 2018-09-05 HISTORY — DX: Chronic kidney disease, stage 4 (severe): N18.4

## 2018-09-05 HISTORY — DX: Vitamin D deficiency, unspecified: E55.9

## 2018-09-05 HISTORY — DX: Anxiety disorder, unspecified: F41.9

## 2018-09-05 HISTORY — DX: Other obstructive and reflux uropathy: N13.8

## 2018-10-03 DIAGNOSIS — N183 Chronic kidney disease, stage 3 (moderate): Secondary | ICD-10-CM | POA: Diagnosis not present

## 2018-10-12 DIAGNOSIS — R0602 Shortness of breath: Secondary | ICD-10-CM | POA: Diagnosis not present

## 2018-10-12 DIAGNOSIS — I4891 Unspecified atrial fibrillation: Secondary | ICD-10-CM | POA: Diagnosis not present

## 2018-10-12 DIAGNOSIS — R0600 Dyspnea, unspecified: Secondary | ICD-10-CM | POA: Diagnosis not present

## 2018-10-16 DIAGNOSIS — I5042 Chronic combined systolic (congestive) and diastolic (congestive) heart failure: Secondary | ICD-10-CM | POA: Diagnosis not present

## 2018-10-16 DIAGNOSIS — Z87891 Personal history of nicotine dependence: Secondary | ICD-10-CM | POA: Diagnosis not present

## 2018-10-16 DIAGNOSIS — R0602 Shortness of breath: Secondary | ICD-10-CM | POA: Diagnosis not present

## 2018-10-16 DIAGNOSIS — J810 Acute pulmonary edema: Secondary | ICD-10-CM | POA: Diagnosis not present

## 2018-10-16 DIAGNOSIS — R0609 Other forms of dyspnea: Secondary | ICD-10-CM | POA: Diagnosis not present

## 2018-10-24 ENCOUNTER — Ambulatory Visit: Payer: Medicare Other | Admitting: Cardiology

## 2018-10-26 DIAGNOSIS — I729 Aneurysm of unspecified site: Secondary | ICD-10-CM | POA: Insufficient documentation

## 2018-10-26 DIAGNOSIS — Z8679 Personal history of other diseases of the circulatory system: Secondary | ICD-10-CM | POA: Insufficient documentation

## 2018-10-26 DIAGNOSIS — Z87891 Personal history of nicotine dependence: Secondary | ICD-10-CM | POA: Insufficient documentation

## 2018-10-26 DIAGNOSIS — J810 Acute pulmonary edema: Secondary | ICD-10-CM

## 2018-10-26 HISTORY — DX: Acute pulmonary edema: J81.0

## 2018-10-26 HISTORY — DX: Personal history of other diseases of the circulatory system: Z86.79

## 2018-10-26 HISTORY — DX: Personal history of nicotine dependence: Z87.891

## 2018-10-27 DIAGNOSIS — Z7901 Long term (current) use of anticoagulants: Secondary | ICD-10-CM | POA: Diagnosis not present

## 2018-10-27 DIAGNOSIS — I110 Hypertensive heart disease with heart failure: Secondary | ICD-10-CM | POA: Diagnosis not present

## 2018-10-27 DIAGNOSIS — E039 Hypothyroidism, unspecified: Secondary | ICD-10-CM | POA: Diagnosis not present

## 2018-10-27 DIAGNOSIS — Z79899 Other long term (current) drug therapy: Secondary | ICD-10-CM | POA: Diagnosis not present

## 2018-10-27 DIAGNOSIS — I509 Heart failure, unspecified: Secondary | ICD-10-CM | POA: Diagnosis not present

## 2018-10-27 DIAGNOSIS — Z794 Long term (current) use of insulin: Secondary | ICD-10-CM | POA: Diagnosis not present

## 2018-10-27 DIAGNOSIS — E785 Hyperlipidemia, unspecified: Secondary | ICD-10-CM | POA: Diagnosis not present

## 2018-10-27 DIAGNOSIS — I4811 Longstanding persistent atrial fibrillation: Secondary | ICD-10-CM | POA: Diagnosis not present

## 2018-10-27 DIAGNOSIS — I361 Nonrheumatic tricuspid (valve) insufficiency: Secondary | ICD-10-CM

## 2018-10-27 DIAGNOSIS — Z7982 Long term (current) use of aspirin: Secondary | ICD-10-CM | POA: Diagnosis not present

## 2018-10-27 DIAGNOSIS — I272 Pulmonary hypertension, unspecified: Secondary | ICD-10-CM | POA: Diagnosis not present

## 2018-10-27 DIAGNOSIS — I130 Hypertensive heart and chronic kidney disease with heart failure and stage 1 through stage 4 chronic kidney disease, or unspecified chronic kidney disease: Secondary | ICD-10-CM | POA: Diagnosis not present

## 2018-10-27 DIAGNOSIS — D631 Anemia in chronic kidney disease: Secondary | ICD-10-CM | POA: Diagnosis present

## 2018-10-27 DIAGNOSIS — R079 Chest pain, unspecified: Secondary | ICD-10-CM | POA: Diagnosis not present

## 2018-10-27 DIAGNOSIS — I5033 Acute on chronic diastolic (congestive) heart failure: Secondary | ICD-10-CM | POA: Diagnosis present

## 2018-10-27 DIAGNOSIS — I4891 Unspecified atrial fibrillation: Secondary | ICD-10-CM | POA: Diagnosis not present

## 2018-10-27 DIAGNOSIS — Z7984 Long term (current) use of oral hypoglycemic drugs: Secondary | ICD-10-CM | POA: Diagnosis not present

## 2018-10-27 DIAGNOSIS — Z792 Long term (current) use of antibiotics: Secondary | ICD-10-CM | POA: Diagnosis not present

## 2018-10-27 DIAGNOSIS — D649 Anemia, unspecified: Secondary | ICD-10-CM | POA: Diagnosis not present

## 2018-10-27 DIAGNOSIS — N183 Chronic kidney disease, stage 3 (moderate): Secondary | ICD-10-CM | POA: Diagnosis not present

## 2018-10-27 DIAGNOSIS — Z951 Presence of aortocoronary bypass graft: Secondary | ICD-10-CM | POA: Diagnosis not present

## 2018-10-27 DIAGNOSIS — R040 Epistaxis: Secondary | ICD-10-CM | POA: Diagnosis present

## 2018-10-27 DIAGNOSIS — E1122 Type 2 diabetes mellitus with diabetic chronic kidney disease: Secondary | ICD-10-CM | POA: Diagnosis present

## 2018-10-27 DIAGNOSIS — Z8673 Personal history of transient ischemic attack (TIA), and cerebral infarction without residual deficits: Secondary | ICD-10-CM | POA: Diagnosis not present

## 2018-10-27 DIAGNOSIS — Z87891 Personal history of nicotine dependence: Secondary | ICD-10-CM | POA: Diagnosis not present

## 2018-10-27 DIAGNOSIS — K219 Gastro-esophageal reflux disease without esophagitis: Secondary | ICD-10-CM | POA: Diagnosis present

## 2018-10-27 DIAGNOSIS — I10 Essential (primary) hypertension: Secondary | ICD-10-CM | POA: Diagnosis not present

## 2018-10-27 DIAGNOSIS — N189 Chronic kidney disease, unspecified: Secondary | ICD-10-CM | POA: Diagnosis not present

## 2018-10-27 DIAGNOSIS — I2510 Atherosclerotic heart disease of native coronary artery without angina pectoris: Secondary | ICD-10-CM | POA: Diagnosis present

## 2018-10-28 ENCOUNTER — Telehealth: Payer: Self-pay | Admitting: Cardiology

## 2018-10-28 ENCOUNTER — Ambulatory Visit: Payer: Medicare Other | Admitting: Cardiology

## 2018-10-28 DIAGNOSIS — E039 Hypothyroidism, unspecified: Secondary | ICD-10-CM

## 2018-10-28 NOTE — Telephone Encounter (Signed)
Patient is in [facility] with SOB, chest pain

## 2018-10-29 DIAGNOSIS — E785 Hyperlipidemia, unspecified: Secondary | ICD-10-CM

## 2018-10-29 DIAGNOSIS — I10 Essential (primary) hypertension: Secondary | ICD-10-CM

## 2018-10-29 NOTE — Telephone Encounter (Signed)
Left message for pt to call and schedule f/u with [facility]. Records printed from [facility] visit. It appears pt has admission date of 10/27/2018.

## 2018-10-30 ENCOUNTER — Encounter: Payer: Self-pay | Admitting: Gastroenterology

## 2018-10-30 ENCOUNTER — Ambulatory Visit: Payer: Medicare Other | Admitting: Cardiology

## 2018-11-05 DIAGNOSIS — I5042 Chronic combined systolic (congestive) and diastolic (congestive) heart failure: Secondary | ICD-10-CM | POA: Diagnosis not present

## 2018-11-05 DIAGNOSIS — N184 Chronic kidney disease, stage 4 (severe): Secondary | ICD-10-CM | POA: Diagnosis not present

## 2018-11-05 DIAGNOSIS — D638 Anemia in other chronic diseases classified elsewhere: Secondary | ICD-10-CM | POA: Insufficient documentation

## 2018-11-05 DIAGNOSIS — E1122 Type 2 diabetes mellitus with diabetic chronic kidney disease: Secondary | ICD-10-CM | POA: Diagnosis not present

## 2018-11-05 DIAGNOSIS — R7989 Other specified abnormal findings of blood chemistry: Secondary | ICD-10-CM | POA: Insufficient documentation

## 2018-11-05 DIAGNOSIS — Z794 Long term (current) use of insulin: Secondary | ICD-10-CM | POA: Diagnosis not present

## 2018-11-05 DIAGNOSIS — E039 Hypothyroidism, unspecified: Secondary | ICD-10-CM | POA: Diagnosis not present

## 2018-11-05 DIAGNOSIS — I129 Hypertensive chronic kidney disease with stage 1 through stage 4 chronic kidney disease, or unspecified chronic kidney disease: Secondary | ICD-10-CM | POA: Diagnosis not present

## 2018-11-05 DIAGNOSIS — D649 Anemia, unspecified: Secondary | ICD-10-CM | POA: Diagnosis not present

## 2018-11-05 DIAGNOSIS — I482 Chronic atrial fibrillation, unspecified: Secondary | ICD-10-CM | POA: Diagnosis not present

## 2018-11-05 DIAGNOSIS — E1165 Type 2 diabetes mellitus with hyperglycemia: Secondary | ICD-10-CM | POA: Diagnosis not present

## 2018-11-05 DIAGNOSIS — I130 Hypertensive heart and chronic kidney disease with heart failure and stage 1 through stage 4 chronic kidney disease, or unspecified chronic kidney disease: Secondary | ICD-10-CM | POA: Diagnosis not present

## 2018-11-05 HISTORY — DX: Other specified abnormal findings of blood chemistry: R79.89

## 2018-11-05 HISTORY — DX: Anemia in other chronic diseases classified elsewhere: D63.8

## 2018-11-14 ENCOUNTER — Ambulatory Visit (INDEPENDENT_AMBULATORY_CARE_PROVIDER_SITE_OTHER): Payer: Medicare Other | Admitting: Cardiology

## 2018-11-14 ENCOUNTER — Encounter: Payer: Self-pay | Admitting: Cardiology

## 2018-11-14 ENCOUNTER — Other Ambulatory Visit: Payer: Self-pay

## 2018-11-14 VITALS — BP 124/66 | HR 55 | Ht 70.0 in | Wt 238.0 lb

## 2018-11-14 DIAGNOSIS — I4819 Other persistent atrial fibrillation: Secondary | ICD-10-CM

## 2018-11-14 DIAGNOSIS — I10 Essential (primary) hypertension: Secondary | ICD-10-CM | POA: Diagnosis not present

## 2018-11-14 DIAGNOSIS — R0609 Other forms of dyspnea: Secondary | ICD-10-CM | POA: Diagnosis not present

## 2018-11-14 DIAGNOSIS — Z9889 Other specified postprocedural states: Secondary | ICD-10-CM

## 2018-11-14 DIAGNOSIS — E1122 Type 2 diabetes mellitus with diabetic chronic kidney disease: Secondary | ICD-10-CM | POA: Diagnosis not present

## 2018-11-14 DIAGNOSIS — N183 Chronic kidney disease, stage 3 (moderate): Secondary | ICD-10-CM

## 2018-11-14 NOTE — Progress Notes (Signed)
Cardiology Office Note:    Date:  11/14/2018   ID:  Barry Haley, DOB 1946-02-13, MRN [id number]  PCP:  Angelina Sheriff, MD  Cardiologist:  Jenean Lindau, MD   Referring MD: Angelina Sheriff, MD    ASSESSMENT:    1. Essential hypertension, benign   2. Persistent atrial fibrillation   3. CKD stage 3 due to type 2 diabetes mellitus ([place])   4. DOE (dyspnea on exertion)   5. History of mitral valve repair   6. History of tricuspid valve repair    PLAN:    In order of problems listed above:  1. Secondary prevention stressed with the patient.  Importance of compliance with diet and medication stressed and he vocalized understanding.  His blood pressure is stable.  Diet was discussed for dyslipidemia and congestive heart failure.  He has significant renal insufficiency.  I reviewed his hospital records from the past and my records and his systolic function was preserved.  I told him to be in close touch with his primary care physician and nephrologist for these issues.  I think they are largely related to his renal function. 2. Patient will be seen in follow-up appointment in 6 months or earlier if the patient has any concerns.  Congestive heart failure education was given to him at extensive length and questions were answered to his satisfaction.   Medication Adjustments/Labs and Tests Ordered: Current medicines are reviewed at length with the patient today.  Concerns regarding medicines are outlined above.  No orders of the defined types were placed in this encounter.  No orders of the defined types were placed in this encounter.    No chief complaint on file.    History of Present Illness:    Barry Haley is a 73 y.o. male.  Patient has history of coronary artery disease and post mitral valve repair and tricuspid valve repair.  He was in the hospital recently.  He has been treated for anemia most likely it seems from renal insufficiency.  He also  has been treated for congestive heart failure in part related to his chronic renal insufficiency.  He denies any chest pain orthopnea or PND.  He is followed by nephrologist on a regular basis.  At the time of my evaluation, the patient is alert awake oriented and in no distress.  Past Medical History:  Diagnosis Date   Anemia 2016   evaluated by Dr Hinton Rao @ [place] [facility]   Anemia, iron deficiency    Aneurysm ([place]) 01/06/15   brain   Anterior cerebral aneurysm 2016   Anxiety state, unspecified    Benign prostatic hyperplasia (BPH) with urinary urgency    Cataracts, bilateral    Cerebral aneurysm, nonruptured    Chronic post-traumatic stress disorder (PTSD)    CKD (chronic kidney disease)    Dr. Justin Mend, [facility]   Diabetes [facility])    IDDm x 3 years   Difficult intubation 01/2013   /[facility] 01/27/13: anterior cords; unsuccessful with MAC4 and MIL3, successful with #5 Glidescope   Epistaxis    GERD (gastroesophageal reflux disease)    H/O epistaxis    High cholesterol    History of hiatal hernia    Hx of migraines    Hyperlipidemia    Hypertension    Meningitis 2010   Neuropathy    Nocturia more than twice per night    Other shock without mention of trauma    Personal history of  other diseases of digestive system    Personal history of other disorder of urinary system    Seborrheic dermatitis    Shortness of breath dyspnea    Sleep apnea    does not wear his cpap   Unspecified cerebral artery occlusion with cerebral infarction     Past Surgical History:  Procedure Laterality Date   APPENDECTOMY     child   I&D EXTREMITY Left 07/13/2015   Procedure: IRRIGATION AND DEBRIDEMENT LEFT LONG FINGER;  Surgeon: Dayna Barker, MD;  Location: [place];  Service: Plastics;  Laterality: Left;   INFECTED SKIN DEBRIDEMENT Left 07/13/2015   left long finger   MINIMALLY INVASIVE TRICUSPID VALVE REPAIR  01/27/2013   mitral valve  reconstruction with cardiopulmonary bypass  01/27/2013   RADIOLOGY WITH ANESTHESIA N/A 04/23/2014   Procedure: RADIOLOGY WITH ANESTHESIA;  Surgeon: Medication Radiologist, MD;  Location: [place];  Service: Radiology;  Laterality: N/A;   RADIOLOGY WITH ANESTHESIA N/A 01/06/2015   Procedure: Pipeline Embolization;  Surgeon: Consuella Lose, MD;  Location: [place];  Service: Radiology;  Laterality: N/A;   TEE WITHOUT CARDIOVERSION N/A 04/22/2014   Procedure: TRANSESOPHAGEAL ECHOCARDIOGRAM (TEE);  Surgeon: Thayer Headings, MD;  Location[facility] ENDOSCOPY;  Service: Cardiovascular;  Laterality: N/A;    Current Medications: Current Meds  Medication Sig   amLODipine (NORVASC) 10 MG tablet Take 1 tablet (10 mg total) by mouth daily.   aspirin EC 81 MG tablet Take 81 mg daily by mouth.   atorvastatin (LIPITOR) 10 MG tablet Take 10 mg by mouth at bedtime.   citalopram (CELEXA) 20 MG tablet Take 20 mg by mouth daily.   ELIQUIS 5 MG TABS tablet TAKE 1 TABLET TWICE A DAY   ergocalciferol (VITAMIN D2) 50000 UNITS capsule Take 50,000 Units by mouth every 30 (thirty) days. No specific day   esomeprazole (NEXIUM) 40 MG capsule Take 40 mg by mouth daily at 12 noon.   Fe Fum-FA-B Cmp-C-Zn-Mg-Mn-Cu (HEMOCYTE PLUS PO) Take 1 tablet by mouth daily.   furosemide (LASIX) 40 MG tablet Take 40 mg by mouth daily.   glimepiride (AMARYL) 4 MG tablet Take 4 mg 2 (two) times daily by mouth.    hydrALAZINE (APRESOLINE) 10 MG tablet Take 10 mg by mouth 3 (three) times daily.    Insulin Glargine (LANTUS SOLOSTAR) 100 UNIT/ML Solostar Pen Inject 45 Units daily into the skin.    Insulin Pen Needle (AURORA PEN NEEDLES) 29G X 12MM MISC 8 Units by Does not apply route daily.   IRON PO Take by mouth.   metoprolol succinate (TOPROL-XL) 50 MG 24 hr tablet Take 25 mg by mouth 2 (two) times daily. Take with or immediately following a meal.   Omega-3 Fatty Acids (FISH OIL PO) Take 1 capsule by mouth daily.   Tamsulosin HCl  (FLOMAX) 0.4 MG CAPS Take 0.4 mg by mouth 2 (two) times daily.    ULORIC 80 MG TABS Take 1 tablet by mouth daily.   vitamin B-12 (CYANOCOBALAMIN) 1000 MCG tablet Take 1,000 mcg by mouth daily.       Allergies:   Hctz [hydrochlorothiazide]; Ibuprofen; Niaspan [niacin er]; and Zolpidem   Social History   Socioeconomic History   Marital status: Married    Spouse name: Joelene Millin   Number of children: 1   Years of education: HS   Highest education level: Not on file  Occupational History   Occupation: Retired    Fish farm manager: OTHER  Social Designer, fashion/clothing strain: Not on file  Food insecurity:    Worry: Not on file    Inability: Not on file   Transportation needs:    Medical: Not on file    Non-medical: Not on file  Tobacco Use   Smoking status: Former Smoker    Packs/day: 2.50    Years: 40.00    Pack years: 100.00    Types: Cigarettes    Last attempt to quit: 10/04/2008    Years since quitting: 10.1   Smokeless tobacco: Never Used  Substance and Sexual Activity   Alcohol use: No    Alcohol/week: 0.0 standard drinks    Comment: quit: 10/04/2008- rarely now   Drug use: No   Sexual activity: Never  Lifestyle   Physical activity:    Days per week: Not on file    Minutes per session: Not on file   Stress: Not on file  Relationships   Social connections:    Talks on phone: Not on file    Gets together: Not on file    Attends religious service: Not on file    Active member of club or organization: Not on file    Attends meetings of clubs or organizations: Not on file    Relationship status: Not on file  Other Topics Concern   Not on file  Social History Narrative   Patient is married Joelene Millin) and lives at home with his wife.   Patient has one adult child.   Patient is retired.   Patient has a high school education.   Patient is right-handed.   Caffeine Use: Sodas- 3 cups   Patient lives at home with family.      Family History: The  patient's family history includes Alzheimer's disease in his father; Cervical cancer in his mother; Diabetes in his brother, father, and mother; Heart failure in his mother.  ROS:   Please see the history of present illness.    All other systems reviewed and are negative.  EKGs/Labs/Other Studies Reviewed:    The following studies were reviewed today: [facility] records were reviewed extensively and discussed with the patient   Recent Labs: No results found for requested labs within last 8760 hours.  Recent Lipid Panel    Component Value Date/Time   CHOL 142 04/16/2014 1604   TRIG 196 (H) 04/16/2014 1604   HDL 25 (L) 04/16/2014 1604   CHOLHDL 5.7 (H) 04/16/2014 1604   CHOLHDL 6.2 01/04/2010 0700   VLDL 22 01/04/2010 0700   LDLCALC 78 04/16/2014 1604    Physical Exam:    VS:  BP 124/66 (BP Location: Right Arm, Patient Position: Sitting, Cuff Size: Normal)    Pulse (!) 55    Ht 5\' 10"  (1.778 m)    Wt 238 lb (108 kg)    SpO2 96%    BMI 34.15 kg/m     Wt Readings from Last 3 Encounters:  11/14/18 238 lb (108 kg)  03/17/18 239 lb (108.4 kg)  07/18/17 235 lb (106.6 kg)     GEN: Patient is in no acute distress HEENT: Normal NECK: No JVD; No carotid bruits LYMPHATICS: No lymphadenopathy CARDIAC: Hear sounds regular, 2/6 systolic murmur at the apex. RESPIRATORY:  Clear to auscultation without rales, wheezing or rhonchi  ABDOMEN: Soft, non-tender, non-distended MUSCULOSKELETAL:  No edema; No deformity  SKIN: Warm and dry NEUROLOGIC:  Alert and oriented x 3 PSYCHIATRIC:  Normal affect   Signed, Jenean Lindau, MD  11/14/2018 11:17 AM    [facility]

## 2018-11-14 NOTE — Patient Instructions (Signed)

## 2018-12-08 DIAGNOSIS — D61818 Other pancytopenia: Secondary | ICD-10-CM | POA: Insufficient documentation

## 2018-12-08 DIAGNOSIS — Z794 Long term (current) use of insulin: Secondary | ICD-10-CM | POA: Diagnosis not present

## 2018-12-08 DIAGNOSIS — E1165 Type 2 diabetes mellitus with hyperglycemia: Secondary | ICD-10-CM | POA: Diagnosis not present

## 2018-12-08 DIAGNOSIS — I10 Essential (primary) hypertension: Secondary | ICD-10-CM | POA: Diagnosis not present

## 2018-12-08 HISTORY — DX: Other pancytopenia: D61.818

## 2019-04-03 ENCOUNTER — Other Ambulatory Visit: Payer: Self-pay | Admitting: Cardiology

## 2019-05-18 ENCOUNTER — Ambulatory Visit: Payer: Medicare Other | Admitting: Cardiology

## 2019-05-25 DIAGNOSIS — I671 Cerebral aneurysm, nonruptured: Secondary | ICD-10-CM | POA: Diagnosis not present

## 2019-05-25 DIAGNOSIS — N400 Enlarged prostate without lower urinary tract symptoms: Secondary | ICD-10-CM | POA: Diagnosis not present

## 2019-05-25 DIAGNOSIS — E1122 Type 2 diabetes mellitus with diabetic chronic kidney disease: Secondary | ICD-10-CM | POA: Diagnosis not present

## 2019-05-25 DIAGNOSIS — N183 Chronic kidney disease, stage 3 (moderate): Secondary | ICD-10-CM | POA: Diagnosis not present

## 2019-05-25 DIAGNOSIS — N189 Chronic kidney disease, unspecified: Secondary | ICD-10-CM | POA: Diagnosis not present

## 2019-05-25 DIAGNOSIS — N057 Unspecified nephritic syndrome with diffuse crescentic glomerulonephritis: Secondary | ICD-10-CM | POA: Diagnosis not present

## 2019-05-25 DIAGNOSIS — I639 Cerebral infarction, unspecified: Secondary | ICD-10-CM | POA: Diagnosis not present

## 2019-05-25 DIAGNOSIS — D631 Anemia in chronic kidney disease: Secondary | ICD-10-CM | POA: Diagnosis not present

## 2019-05-25 DIAGNOSIS — N2581 Secondary hyperparathyroidism of renal origin: Secondary | ICD-10-CM | POA: Diagnosis not present

## 2019-05-25 DIAGNOSIS — M86232 Subacute osteomyelitis, left radius and ulna: Secondary | ICD-10-CM | POA: Diagnosis not present

## 2019-05-25 DIAGNOSIS — I509 Heart failure, unspecified: Secondary | ICD-10-CM | POA: Diagnosis not present

## 2019-05-25 DIAGNOSIS — Z8661 Personal history of infections of the central nervous system: Secondary | ICD-10-CM | POA: Diagnosis not present

## 2019-05-25 DIAGNOSIS — Z9889 Other specified postprocedural states: Secondary | ICD-10-CM | POA: Diagnosis not present

## 2019-06-02 DIAGNOSIS — Z794 Long term (current) use of insulin: Secondary | ICD-10-CM | POA: Diagnosis not present

## 2019-06-02 DIAGNOSIS — N184 Chronic kidney disease, stage 4 (severe): Secondary | ICD-10-CM | POA: Diagnosis not present

## 2019-06-02 DIAGNOSIS — D61818 Other pancytopenia: Secondary | ICD-10-CM | POA: Diagnosis not present

## 2019-06-02 DIAGNOSIS — I129 Hypertensive chronic kidney disease with stage 1 through stage 4 chronic kidney disease, or unspecified chronic kidney disease: Secondary | ICD-10-CM | POA: Diagnosis not present

## 2019-06-02 DIAGNOSIS — E782 Mixed hyperlipidemia: Secondary | ICD-10-CM | POA: Diagnosis not present

## 2019-06-02 DIAGNOSIS — G4733 Obstructive sleep apnea (adult) (pediatric): Secondary | ICD-10-CM | POA: Diagnosis not present

## 2019-06-02 DIAGNOSIS — E1165 Type 2 diabetes mellitus with hyperglycemia: Secondary | ICD-10-CM | POA: Diagnosis not present

## 2019-06-02 DIAGNOSIS — K219 Gastro-esophageal reflux disease without esophagitis: Secondary | ICD-10-CM | POA: Diagnosis not present

## 2019-06-02 DIAGNOSIS — R7989 Other specified abnormal findings of blood chemistry: Secondary | ICD-10-CM | POA: Diagnosis not present

## 2019-06-06 DIAGNOSIS — Z23 Encounter for immunization: Secondary | ICD-10-CM | POA: Diagnosis not present

## 2019-06-09 ENCOUNTER — Encounter: Payer: Self-pay | Admitting: Cardiology

## 2019-06-09 ENCOUNTER — Other Ambulatory Visit: Payer: Self-pay

## 2019-06-09 ENCOUNTER — Ambulatory Visit (INDEPENDENT_AMBULATORY_CARE_PROVIDER_SITE_OTHER): Payer: Medicare Other | Admitting: Cardiology

## 2019-06-09 VITALS — BP 122/54 | HR 86 | Ht 70.0 in | Wt 210.0 lb

## 2019-06-09 DIAGNOSIS — Z87891 Personal history of nicotine dependence: Secondary | ICD-10-CM

## 2019-06-09 DIAGNOSIS — I10 Essential (primary) hypertension: Secondary | ICD-10-CM | POA: Diagnosis not present

## 2019-06-09 DIAGNOSIS — Z9889 Other specified postprocedural states: Secondary | ICD-10-CM

## 2019-06-09 DIAGNOSIS — I4819 Other persistent atrial fibrillation: Secondary | ICD-10-CM | POA: Diagnosis not present

## 2019-06-09 DIAGNOSIS — E782 Mixed hyperlipidemia: Secondary | ICD-10-CM | POA: Diagnosis not present

## 2019-06-09 NOTE — Patient Instructions (Signed)

## 2019-06-09 NOTE — Progress Notes (Signed)
Cardiology Office Note:    Date:  06/09/2019   ID:  Barry Haley, DOB September 08, 1945, MRN [id number]  PCP:  Angelina Sheriff, MD  Cardiologist:  Jenean Lindau, MD   Referring MD: Angelina Sheriff, MD    ASSESSMENT:    1. Persistent atrial fibrillation ([place])   2. Essential hypertension, benign   3. Former smoker   4. Mixed dyslipidemia   5. S/P mitral valve repair   6. S/P tricuspid valve repair    PLAN:    In order of problems listed above:  1. Atrial fibrillation:I discussed with the patient atrial fibrillation, disease process. Management and therapy including rate and rhythm control, anticoagulation benefits and potential risks were discussed extensively with the patient. Patient had multiple questions which were answered to patient's satisfaction. 2. Essential hypertension: Blood pressure is stable 3. Anemia: Managed by his primary care physician and nephrologist.  The last hemoglobin was according to the patient 9.  This is managed by his primary care physician and nephrologist and he assures me that he has had no issues with bleeding and this is predominantly of kidney origin. 4. Patient will be seen in follow-up appointment in 6 months or earlier if the patient has any concerns    Medication Adjustments/Labs and Tests Ordered: Current medicines are reviewed at length with the patient today.  Concerns regarding medicines are outlined above.  No orders of the defined types were placed in this encounter.  No orders of the defined types were placed in this encounter.    Chief Complaint  Patient presents with   Follow-up     History of Present Illness:    Barry Haley is a 73 y.o. male.  Patient has past medical history of atrial fibrillation, essential hypertension and dyslipidemia.  He mentions to me that he is anemic and his nephrologist has told him that it is because of his kidney and is planning to get injections for this.  I presume  its erythropoietin.  He mentions to me that he is not having any issues with bleeding or any such problems.  He is on anticoagulation for atrial fibrillation.  At the time of my evaluation, the patient is alert awake oriented and in no distress.  Past Medical History:  Diagnosis Date   Anemia 2016   evaluated by Dr Hinton Rao @ [facility]   Anemia, iron deficiency    Aneurysm ([place]) 01/06/15   brain   Anterior cerebral aneurysm 2016   Anxiety state, unspecified    Benign prostatic hyperplasia (BPH) with urinary urgency    Cataracts, bilateral    Cerebral aneurysm, nonruptured    Chronic post-traumatic stress disorder (PTSD)    CKD (chronic kidney disease)    Dr. Justin Mend, [facility]   Diabetes [facility])    IDDm x 3 years   Difficult intubation 01/2013   /[facility] 01/27/13: anterior cords; unsuccessful with MAC4 and MIL3, successful with #5 Glidescope   Epistaxis    GERD (gastroesophageal reflux disease)    H/O epistaxis    High cholesterol    History of hiatal hernia    Hx of migraines    Hyperlipidemia    Hypertension    Meningitis 2010   Neuropathy    Nocturia more than twice per night    Other shock without mention of trauma    Personal history of other diseases of digestive system    Personal history of other disorder of urinary system  Seborrheic dermatitis    Shortness of breath dyspnea    Sleep apnea    does not wear his cpap   Unspecified cerebral artery occlusion with cerebral infarction     Past Surgical History:  Procedure Laterality Date   APPENDECTOMY     child   I&D EXTREMITY Left 07/13/2015   Procedure: IRRIGATION AND DEBRIDEMENT LEFT LONG FINGER;  Surgeon: Dayna Barker, MD;  Location: [place];  Service: Plastics;  Laterality: Left;   INFECTED SKIN DEBRIDEMENT Left 07/13/2015   left long finger   MINIMALLY INVASIVE TRICUSPID VALVE REPAIR  01/27/2013   mitral valve reconstruction with cardiopulmonary bypass   01/27/2013   RADIOLOGY WITH ANESTHESIA N/A 04/23/2014   Procedure: RADIOLOGY WITH ANESTHESIA;  Surgeon: Medication Radiologist, MD;  Location: [place];  Service: Radiology;  Laterality: N/A;   RADIOLOGY WITH ANESTHESIA N/A 01/06/2015   Procedure: Pipeline Embolization;  Surgeon: Consuella Lose, MD;  Location: [place];  Service: Radiology;  Laterality: N/A;   TEE WITHOUT CARDIOVERSION N/A 04/22/2014   Procedure: TRANSESOPHAGEAL ECHOCARDIOGRAM (TEE);  Surgeon: Thayer Headings, MD;  Location: [facility] ENDOSCOPY;  Service: Cardiovascular;  Laterality: N/A;    Current Medications: Current Meds  Medication Sig   albuterol (VENTOLIN HFA) 108 (90 Base) MCG/ACT inhaler Inhale into the lungs.   amLODipine (NORVASC) 10 MG tablet Take 1 tablet (10 mg total) by mouth daily.   aspirin EC 81 MG tablet Take 81 mg daily by mouth.   atorvastatin (LIPITOR) 10 MG tablet Take 10 mg by mouth at bedtime.   citalopram (CELEXA) 40 MG tablet TAKE 1 TABLET DAILY   ELIQUIS 5 MG TABS tablet TAKE 1 TABLET TWICE A DAY   ergocalciferol (VITAMIN D2) 50000 UNITS capsule Take 50,000 Units by mouth every 30 (thirty) days. No specific day   esomeprazole (NEXIUM) 40 MG capsule Take 40 mg by mouth daily at 12 noon.   Fluticasone-Salmeterol (ADVAIR) 100-50 MCG/DOSE AEPB Inhale into the lungs.   furosemide (LASIX) 40 MG tablet Take 40 mg by mouth daily.   hydrALAZINE (APRESOLINE) 10 MG tablet Take 10 mg by mouth 3 (three) times daily.    hydrOXYzine (ATARAX/VISTARIL) 10 MG tablet    IRON PO Take by mouth.   metoprolol succinate (TOPROL-XL) 50 MG 24 hr tablet Take 25 mg by mouth 2 (two) times daily. Take with or immediately following a meal.   nitroGLYCERIN (NITROSTAT) 0.4 MG SL tablet Place 1 tablet (0.4 mg total) every 5 (five) minutes as needed under the tongue for chest pain.   Tamsulosin HCl (FLOMAX) 0.4 MG CAPS Take 0.4 mg by mouth 2 (two) times daily.    ULORIC 80 MG TABS Take 1 tablet by mouth daily.   vitamin  B-12 (CYANOCOBALAMIN) 1000 MCG tablet Take 1,000 mcg by mouth daily.     [DISCONTINUED] citalopram (CELEXA) 20 MG tablet Take 20 mg by mouth daily.   [DISCONTINUED] Fe Fum-FA-B Cmp-C-Zn-Mg-Mn-Cu (HEMOCYTE PLUS PO) Take 1 tablet by mouth daily.     Allergies:   Hctz [hydrochlorothiazide], Ibuprofen, Niaspan [niacin er], and Zolpidem   Social History   Socioeconomic History   Marital status: Married    Spouse name: Joelene Millin   Number of children: 1   Years of education: HS   Highest education level: Not on file  Occupational History   Occupation: Retired    Fish farm manager: OTHER  Social Designer, fashion/clothing strain: Not on file   Food insecurity    Worry: Not on file    Inability: Not  on file   Transportation needs    Medical: Not on file    Non-medical: Not on file  Tobacco Use   Smoking status: Former Smoker    Packs/day: 2.50    Years: 40.00    Pack years: 100.00    Types: Cigarettes    Quit date: 10/04/2008    Years since quitting: 10.6   Smokeless tobacco: Never Used  Substance and Sexual Activity   Alcohol use: No    Alcohol/week: 0.0 standard drinks    Comment: quit: 10/04/2008- rarely now   Drug use: No   Sexual activity: Never  Lifestyle   Physical activity    Days per week: Not on file    Minutes per session: Not on file   Stress: Not on file  Relationships   Social connections    Talks on phone: Not on file    Gets together: Not on file    Attends religious service: Not on file    Active member of club or organization: Not on file    Attends meetings of clubs or organizations: Not on file    Relationship status: Not on file  Other Topics Concern   Not on file  Social History Narrative   Patient is married Joelene Millin) and lives at home with his wife.   Patient has one adult child.   Patient is retired.   Patient has a high school education.   Patient is right-handed.   Caffeine Use: Sodas- 3 cups   Patient lives at home with family.        Family History: The patient's family history includes Alzheimer's disease in his father; Cervical cancer in his mother; Diabetes in his brother, father, and mother; Heart failure in his mother.  ROS:   Please see the history of present illness.    All other systems reviewed and are negative.  EKGs/Labs/Other Studies Reviewed:    The following studies were reviewed today: I reviewed lab work based on a sheet of paper of [place] given to me   Recent Labs: No results found for requested labs within last 8760 hours.  Recent Lipid Panel    Component Value Date/Time   CHOL 142 04/16/2014 1604   TRIG 196 (H) 04/16/2014 1604   HDL 25 (L) 04/16/2014 1604   CHOLHDL 5.7 (H) 04/16/2014 1604   CHOLHDL 6.2 01/04/2010 0700   VLDL 22 01/04/2010 0700   LDLCALC 78 04/16/2014 1604    Physical Exam:    VS:  BP (!) 122/54 (BP Location: Right Arm, Patient Position: Sitting, Cuff Size: Normal)    Pulse 86    Ht 5\' 10"  (1.778 m)    Wt 210 lb (95.3 kg)    SpO2 97%    BMI 30.13 kg/m     Wt Readings from Last 3 Encounters:  06/09/19 210 lb (95.3 kg)  11/14/18 238 lb (108 kg)  03/17/18 239 lb (108.4 kg)     GEN: Patient is in no acute distress HEENT: Normal NECK: No JVD; No carotid bruits LYMPHATICS: No lymphadenopathy CARDIAC: Hear sounds regular, 2/6 systolic murmur at the apex. RESPIRATORY:  Clear to auscultation without rales, wheezing or rhonchi  ABDOMEN: Soft, non-tender, non-distended MUSCULOSKELETAL:  No edema; No deformity  SKIN: Warm and dry NEUROLOGIC:  Alert and oriented x 3 PSYCHIATRIC:  Normal affect   Signed, Jenean Lindau, MD  06/09/2019 10:09 AM    [place]

## 2019-06-12 DIAGNOSIS — D631 Anemia in chronic kidney disease: Secondary | ICD-10-CM | POA: Diagnosis not present

## 2019-06-12 DIAGNOSIS — N183 Chronic kidney disease, stage 3 unspecified: Secondary | ICD-10-CM | POA: Diagnosis not present

## 2019-06-29 DIAGNOSIS — D631 Anemia in chronic kidney disease: Secondary | ICD-10-CM | POA: Diagnosis not present

## 2019-06-29 DIAGNOSIS — N183 Chronic kidney disease, stage 3 unspecified: Secondary | ICD-10-CM | POA: Diagnosis not present

## 2019-07-13 DIAGNOSIS — D631 Anemia in chronic kidney disease: Secondary | ICD-10-CM | POA: Diagnosis not present

## 2019-07-13 DIAGNOSIS — N183 Chronic kidney disease, stage 3 unspecified: Secondary | ICD-10-CM | POA: Diagnosis not present

## 2019-07-27 DIAGNOSIS — N183 Chronic kidney disease, stage 3 unspecified: Secondary | ICD-10-CM | POA: Diagnosis not present

## 2019-07-27 DIAGNOSIS — D631 Anemia in chronic kidney disease: Secondary | ICD-10-CM | POA: Diagnosis not present

## 2019-08-10 DIAGNOSIS — D631 Anemia in chronic kidney disease: Secondary | ICD-10-CM | POA: Diagnosis not present

## 2019-08-10 DIAGNOSIS — N183 Chronic kidney disease, stage 3 unspecified: Secondary | ICD-10-CM | POA: Diagnosis not present

## 2019-08-17 DIAGNOSIS — I4891 Unspecified atrial fibrillation: Secondary | ICD-10-CM | POA: Diagnosis not present

## 2019-08-17 DIAGNOSIS — Z8661 Personal history of infections of the central nervous system: Secondary | ICD-10-CM | POA: Diagnosis not present

## 2019-08-17 DIAGNOSIS — M86232 Subacute osteomyelitis, left radius and ulna: Secondary | ICD-10-CM | POA: Diagnosis not present

## 2019-08-17 DIAGNOSIS — N400 Enlarged prostate without lower urinary tract symptoms: Secondary | ICD-10-CM | POA: Diagnosis not present

## 2019-08-17 DIAGNOSIS — Z9889 Other specified postprocedural states: Secondary | ICD-10-CM | POA: Diagnosis not present

## 2019-08-17 DIAGNOSIS — I509 Heart failure, unspecified: Secondary | ICD-10-CM | POA: Diagnosis not present

## 2019-08-17 DIAGNOSIS — E1122 Type 2 diabetes mellitus with diabetic chronic kidney disease: Secondary | ICD-10-CM | POA: Diagnosis not present

## 2019-08-17 DIAGNOSIS — D519 Vitamin B12 deficiency anemia, unspecified: Secondary | ICD-10-CM | POA: Diagnosis not present

## 2019-08-17 DIAGNOSIS — N183 Chronic kidney disease, stage 3 unspecified: Secondary | ICD-10-CM | POA: Diagnosis not present

## 2019-08-17 DIAGNOSIS — N2581 Secondary hyperparathyroidism of renal origin: Secondary | ICD-10-CM | POA: Diagnosis not present

## 2019-08-17 DIAGNOSIS — M109 Gout, unspecified: Secondary | ICD-10-CM | POA: Diagnosis not present

## 2019-08-17 DIAGNOSIS — N057 Unspecified nephritic syndrome with diffuse crescentic glomerulonephritis: Secondary | ICD-10-CM | POA: Diagnosis not present

## 2019-08-17 DIAGNOSIS — N189 Chronic kidney disease, unspecified: Secondary | ICD-10-CM | POA: Diagnosis not present

## 2019-09-07 DIAGNOSIS — E782 Mixed hyperlipidemia: Secondary | ICD-10-CM | POA: Diagnosis not present

## 2019-09-07 DIAGNOSIS — I482 Chronic atrial fibrillation, unspecified: Secondary | ICD-10-CM | POA: Diagnosis not present

## 2019-09-07 DIAGNOSIS — R0981 Nasal congestion: Secondary | ICD-10-CM | POA: Diagnosis not present

## 2019-09-07 DIAGNOSIS — Z794 Long term (current) use of insulin: Secondary | ICD-10-CM | POA: Diagnosis not present

## 2019-09-07 DIAGNOSIS — K219 Gastro-esophageal reflux disease without esophagitis: Secondary | ICD-10-CM | POA: Diagnosis not present

## 2019-09-07 DIAGNOSIS — I130 Hypertensive heart and chronic kidney disease with heart failure and stage 1 through stage 4 chronic kidney disease, or unspecified chronic kidney disease: Secondary | ICD-10-CM | POA: Diagnosis not present

## 2019-09-07 DIAGNOSIS — N184 Chronic kidney disease, stage 4 (severe): Secondary | ICD-10-CM | POA: Diagnosis not present

## 2019-09-07 DIAGNOSIS — E1122 Type 2 diabetes mellitus with diabetic chronic kidney disease: Secondary | ICD-10-CM | POA: Diagnosis not present

## 2019-09-07 DIAGNOSIS — D61818 Other pancytopenia: Secondary | ICD-10-CM | POA: Diagnosis not present

## 2019-09-07 DIAGNOSIS — R0989 Other specified symptoms and signs involving the circulatory and respiratory systems: Secondary | ICD-10-CM | POA: Diagnosis not present

## 2019-09-07 DIAGNOSIS — R0602 Shortness of breath: Secondary | ICD-10-CM | POA: Diagnosis not present

## 2019-09-07 DIAGNOSIS — Z7901 Long term (current) use of anticoagulants: Secondary | ICD-10-CM | POA: Diagnosis not present

## 2019-09-07 DIAGNOSIS — Z87891 Personal history of nicotine dependence: Secondary | ICD-10-CM | POA: Diagnosis not present

## 2019-09-07 DIAGNOSIS — Z20822 Contact with and (suspected) exposure to covid-19: Secondary | ICD-10-CM

## 2019-09-07 DIAGNOSIS — I10 Essential (primary) hypertension: Secondary | ICD-10-CM | POA: Diagnosis not present

## 2019-09-07 DIAGNOSIS — R05 Cough: Secondary | ICD-10-CM | POA: Diagnosis not present

## 2019-09-07 DIAGNOSIS — Z0000 Encounter for general adult medical examination without abnormal findings: Secondary | ICD-10-CM | POA: Diagnosis not present

## 2019-09-07 DIAGNOSIS — Z20828 Contact with and (suspected) exposure to other viral communicable diseases: Secondary | ICD-10-CM | POA: Diagnosis not present

## 2019-09-07 DIAGNOSIS — I5042 Chronic combined systolic (congestive) and diastolic (congestive) heart failure: Secondary | ICD-10-CM | POA: Diagnosis not present

## 2019-09-07 DIAGNOSIS — E1165 Type 2 diabetes mellitus with hyperglycemia: Secondary | ICD-10-CM | POA: Diagnosis not present

## 2019-09-07 HISTORY — DX: Contact with and (suspected) exposure to covid-19: Z20.822

## 2019-09-14 DIAGNOSIS — N183 Chronic kidney disease, stage 3 unspecified: Secondary | ICD-10-CM | POA: Diagnosis not present

## 2019-09-14 DIAGNOSIS — D631 Anemia in chronic kidney disease: Secondary | ICD-10-CM | POA: Diagnosis not present

## 2019-09-21 DIAGNOSIS — D631 Anemia in chronic kidney disease: Secondary | ICD-10-CM | POA: Diagnosis not present

## 2019-09-21 DIAGNOSIS — N183 Chronic kidney disease, stage 3 unspecified: Secondary | ICD-10-CM | POA: Diagnosis not present

## 2019-09-28 DIAGNOSIS — D631 Anemia in chronic kidney disease: Secondary | ICD-10-CM | POA: Diagnosis not present

## 2019-09-28 DIAGNOSIS — N183 Chronic kidney disease, stage 3 unspecified: Secondary | ICD-10-CM | POA: Diagnosis not present

## 2019-09-30 ENCOUNTER — Other Ambulatory Visit: Payer: Self-pay | Admitting: Cardiology

## 2019-10-05 DIAGNOSIS — N183 Chronic kidney disease, stage 3 unspecified: Secondary | ICD-10-CM | POA: Diagnosis not present

## 2019-10-05 DIAGNOSIS — D631 Anemia in chronic kidney disease: Secondary | ICD-10-CM | POA: Diagnosis not present

## 2019-10-12 DIAGNOSIS — D631 Anemia in chronic kidney disease: Secondary | ICD-10-CM | POA: Diagnosis not present

## 2019-10-12 DIAGNOSIS — N183 Chronic kidney disease, stage 3 unspecified: Secondary | ICD-10-CM | POA: Diagnosis not present

## 2019-10-19 DIAGNOSIS — N183 Chronic kidney disease, stage 3 unspecified: Secondary | ICD-10-CM | POA: Diagnosis not present

## 2019-10-19 DIAGNOSIS — D631 Anemia in chronic kidney disease: Secondary | ICD-10-CM | POA: Diagnosis not present

## 2019-10-26 DIAGNOSIS — N183 Chronic kidney disease, stage 3 unspecified: Secondary | ICD-10-CM | POA: Diagnosis not present

## 2019-10-26 DIAGNOSIS — D631 Anemia in chronic kidney disease: Secondary | ICD-10-CM | POA: Diagnosis not present

## 2019-11-02 DIAGNOSIS — N183 Chronic kidney disease, stage 3 unspecified: Secondary | ICD-10-CM | POA: Diagnosis not present

## 2019-11-02 DIAGNOSIS — D631 Anemia in chronic kidney disease: Secondary | ICD-10-CM | POA: Diagnosis not present

## 2019-11-09 DIAGNOSIS — D631 Anemia in chronic kidney disease: Secondary | ICD-10-CM | POA: Diagnosis not present

## 2019-11-09 DIAGNOSIS — N183 Chronic kidney disease, stage 3 unspecified: Secondary | ICD-10-CM | POA: Diagnosis not present

## 2019-11-16 DIAGNOSIS — D631 Anemia in chronic kidney disease: Secondary | ICD-10-CM | POA: Diagnosis not present

## 2019-11-16 DIAGNOSIS — N183 Chronic kidney disease, stage 3 unspecified: Secondary | ICD-10-CM | POA: Diagnosis not present

## 2019-11-23 DIAGNOSIS — D631 Anemia in chronic kidney disease: Secondary | ICD-10-CM | POA: Diagnosis not present

## 2019-11-23 DIAGNOSIS — N183 Chronic kidney disease, stage 3 unspecified: Secondary | ICD-10-CM | POA: Diagnosis not present

## 2019-11-24 DIAGNOSIS — N183 Chronic kidney disease, stage 3 unspecified: Secondary | ICD-10-CM | POA: Diagnosis not present

## 2019-11-24 DIAGNOSIS — I4891 Unspecified atrial fibrillation: Secondary | ICD-10-CM | POA: Diagnosis not present

## 2019-11-24 DIAGNOSIS — N400 Enlarged prostate without lower urinary tract symptoms: Secondary | ICD-10-CM | POA: Diagnosis not present

## 2019-11-24 DIAGNOSIS — D519 Vitamin B12 deficiency anemia, unspecified: Secondary | ICD-10-CM | POA: Diagnosis not present

## 2019-11-24 DIAGNOSIS — Z9889 Other specified postprocedural states: Secondary | ICD-10-CM | POA: Diagnosis not present

## 2019-11-24 DIAGNOSIS — M109 Gout, unspecified: Secondary | ICD-10-CM | POA: Diagnosis not present

## 2019-11-24 DIAGNOSIS — N189 Chronic kidney disease, unspecified: Secondary | ICD-10-CM | POA: Diagnosis not present

## 2019-11-24 DIAGNOSIS — N2581 Secondary hyperparathyroidism of renal origin: Secondary | ICD-10-CM | POA: Diagnosis not present

## 2019-11-24 DIAGNOSIS — E1122 Type 2 diabetes mellitus with diabetic chronic kidney disease: Secondary | ICD-10-CM | POA: Diagnosis not present

## 2019-11-24 DIAGNOSIS — I509 Heart failure, unspecified: Secondary | ICD-10-CM | POA: Diagnosis not present

## 2019-11-24 DIAGNOSIS — N057 Unspecified nephritic syndrome with diffuse crescentic glomerulonephritis: Secondary | ICD-10-CM | POA: Diagnosis not present

## 2019-11-24 DIAGNOSIS — I671 Cerebral aneurysm, nonruptured: Secondary | ICD-10-CM | POA: Diagnosis not present

## 2019-11-24 DIAGNOSIS — I639 Cerebral infarction, unspecified: Secondary | ICD-10-CM | POA: Diagnosis not present

## 2019-11-25 DIAGNOSIS — E669 Obesity, unspecified: Secondary | ICD-10-CM | POA: Diagnosis not present

## 2019-11-25 DIAGNOSIS — E1165 Type 2 diabetes mellitus with hyperglycemia: Secondary | ICD-10-CM | POA: Diagnosis not present

## 2019-11-25 DIAGNOSIS — Z6830 Body mass index (BMI) 30.0-30.9, adult: Secondary | ICD-10-CM | POA: Diagnosis not present

## 2019-11-30 DIAGNOSIS — N183 Chronic kidney disease, stage 3 unspecified: Secondary | ICD-10-CM | POA: Diagnosis not present

## 2019-11-30 DIAGNOSIS — D631 Anemia in chronic kidney disease: Secondary | ICD-10-CM | POA: Diagnosis not present

## 2019-12-07 DIAGNOSIS — N183 Chronic kidney disease, stage 3 unspecified: Secondary | ICD-10-CM | POA: Diagnosis not present

## 2019-12-07 DIAGNOSIS — D631 Anemia in chronic kidney disease: Secondary | ICD-10-CM | POA: Diagnosis not present

## 2019-12-08 DIAGNOSIS — N184 Chronic kidney disease, stage 4 (severe): Secondary | ICD-10-CM | POA: Diagnosis not present

## 2019-12-08 DIAGNOSIS — I10 Essential (primary) hypertension: Secondary | ICD-10-CM | POA: Diagnosis not present

## 2019-12-08 DIAGNOSIS — Z794 Long term (current) use of insulin: Secondary | ICD-10-CM | POA: Diagnosis not present

## 2019-12-08 DIAGNOSIS — R05 Cough: Secondary | ICD-10-CM | POA: Diagnosis not present

## 2019-12-08 DIAGNOSIS — D638 Anemia in other chronic diseases classified elsewhere: Secondary | ICD-10-CM | POA: Diagnosis not present

## 2019-12-08 DIAGNOSIS — D631 Anemia in chronic kidney disease: Secondary | ICD-10-CM | POA: Diagnosis not present

## 2019-12-08 DIAGNOSIS — E782 Mixed hyperlipidemia: Secondary | ICD-10-CM | POA: Diagnosis not present

## 2019-12-08 DIAGNOSIS — E1165 Type 2 diabetes mellitus with hyperglycemia: Secondary | ICD-10-CM | POA: Diagnosis not present

## 2019-12-08 DIAGNOSIS — J329 Chronic sinusitis, unspecified: Secondary | ICD-10-CM | POA: Diagnosis not present

## 2019-12-08 DIAGNOSIS — J310 Chronic rhinitis: Secondary | ICD-10-CM | POA: Diagnosis not present

## 2019-12-08 DIAGNOSIS — K219 Gastro-esophageal reflux disease without esophagitis: Secondary | ICD-10-CM | POA: Diagnosis not present

## 2019-12-08 DIAGNOSIS — L0100 Impetigo, unspecified: Secondary | ICD-10-CM | POA: Diagnosis not present

## 2019-12-09 DIAGNOSIS — E669 Obesity, unspecified: Secondary | ICD-10-CM | POA: Diagnosis not present

## 2019-12-09 DIAGNOSIS — Z6830 Body mass index (BMI) 30.0-30.9, adult: Secondary | ICD-10-CM | POA: Diagnosis not present

## 2019-12-09 DIAGNOSIS — E1165 Type 2 diabetes mellitus with hyperglycemia: Secondary | ICD-10-CM | POA: Diagnosis not present

## 2019-12-14 DIAGNOSIS — D631 Anemia in chronic kidney disease: Secondary | ICD-10-CM | POA: Diagnosis not present

## 2019-12-14 DIAGNOSIS — N183 Chronic kidney disease, stage 3 unspecified: Secondary | ICD-10-CM | POA: Diagnosis not present

## 2019-12-17 DIAGNOSIS — I679 Cerebrovascular disease, unspecified: Secondary | ICD-10-CM | POA: Diagnosis not present

## 2019-12-17 DIAGNOSIS — E7800 Pure hypercholesterolemia, unspecified: Secondary | ICD-10-CM | POA: Diagnosis not present

## 2019-12-17 DIAGNOSIS — D61818 Other pancytopenia: Secondary | ICD-10-CM | POA: Diagnosis not present

## 2019-12-17 DIAGNOSIS — E538 Deficiency of other specified B group vitamins: Secondary | ICD-10-CM | POA: Diagnosis not present

## 2019-12-17 DIAGNOSIS — Z7982 Long term (current) use of aspirin: Secondary | ICD-10-CM | POA: Diagnosis not present

## 2019-12-17 DIAGNOSIS — D649 Anemia, unspecified: Secondary | ICD-10-CM | POA: Diagnosis not present

## 2019-12-17 DIAGNOSIS — D509 Iron deficiency anemia, unspecified: Secondary | ICD-10-CM | POA: Diagnosis not present

## 2019-12-17 DIAGNOSIS — E119 Type 2 diabetes mellitus without complications: Secondary | ICD-10-CM | POA: Diagnosis not present

## 2019-12-17 DIAGNOSIS — I509 Heart failure, unspecified: Secondary | ICD-10-CM | POA: Diagnosis not present

## 2019-12-17 DIAGNOSIS — Z79899 Other long term (current) drug therapy: Secondary | ICD-10-CM | POA: Diagnosis not present

## 2019-12-17 DIAGNOSIS — N289 Disorder of kidney and ureter, unspecified: Secondary | ICD-10-CM | POA: Diagnosis not present

## 2019-12-21 DIAGNOSIS — N183 Chronic kidney disease, stage 3 unspecified: Secondary | ICD-10-CM | POA: Diagnosis not present

## 2019-12-21 DIAGNOSIS — D631 Anemia in chronic kidney disease: Secondary | ICD-10-CM | POA: Diagnosis not present

## 2019-12-23 DIAGNOSIS — E1165 Type 2 diabetes mellitus with hyperglycemia: Secondary | ICD-10-CM | POA: Diagnosis not present

## 2019-12-23 DIAGNOSIS — Z6830 Body mass index (BMI) 30.0-30.9, adult: Secondary | ICD-10-CM | POA: Diagnosis not present

## 2019-12-23 DIAGNOSIS — E669 Obesity, unspecified: Secondary | ICD-10-CM | POA: Diagnosis not present

## 2019-12-24 ENCOUNTER — Other Ambulatory Visit: Payer: Self-pay

## 2019-12-24 ENCOUNTER — Ambulatory Visit: Payer: Medicare Other | Admitting: Cardiology

## 2019-12-25 ENCOUNTER — Ambulatory Visit (INDEPENDENT_AMBULATORY_CARE_PROVIDER_SITE_OTHER): Payer: Medicare Other | Admitting: Cardiology

## 2019-12-25 ENCOUNTER — Other Ambulatory Visit: Payer: Self-pay

## 2019-12-25 ENCOUNTER — Encounter: Payer: Self-pay | Admitting: Cardiology

## 2019-12-25 VITALS — BP 132/58 | HR 55 | Temp 97.9°F | Ht 70.0 in | Wt 210.0 lb

## 2019-12-25 DIAGNOSIS — Z8679 Personal history of other diseases of the circulatory system: Secondary | ICD-10-CM

## 2019-12-25 DIAGNOSIS — E1122 Type 2 diabetes mellitus with diabetic chronic kidney disease: Secondary | ICD-10-CM | POA: Diagnosis not present

## 2019-12-25 DIAGNOSIS — E782 Mixed hyperlipidemia: Secondary | ICD-10-CM

## 2019-12-25 DIAGNOSIS — Z9889 Other specified postprocedural states: Secondary | ICD-10-CM | POA: Diagnosis not present

## 2019-12-25 DIAGNOSIS — N183 Chronic kidney disease, stage 3 unspecified: Secondary | ICD-10-CM | POA: Diagnosis not present

## 2019-12-25 MED ORDER — NITROGLYCERIN 0.4 MG SL SUBL: 0.4000 mg | SUBLINGUAL_TABLET | SUBLINGUAL | 6 refills | Status: AC | PRN

## 2019-12-25 NOTE — Progress Notes (Signed)
Cardiology Office Note:    Date:  12/25/2019   ID:  Barry Haley, DOB September 24, 1945, MRN [id number]  PCP:  Angelina Sheriff, MD  Cardiologist:  Jenean Lindau, MD   Referring MD: Angelina Sheriff, MD    ASSESSMENT:    1. Status post cerebral aneurysm repair   2. S/P tricuspid valve repair   3. S/P mitral valve repair   4. Mixed dyslipidemia   5. History of tricuspid valve repair   6. History of mitral valve repair   7. CKD stage 3 due to type 2 diabetes mellitus ([place])   8. History of atrial fibrillation    PLAN:    In order of problems listed above:  1. Secondary prevention stressed with the patient.  Importance of compliance with diet and medication stressed and he vocalized understanding. 2. History of atrial fibrillation:I discussed with the patient atrial fibrillation, disease process. Management and therapy including rate and rhythm control, anticoagulation benefits and potential risks were discussed extensively with the patient. Patient had multiple questions which were answered to patient's satisfaction.  Post mitral and tricuspid valve repair: Stable at this time echocardiogram report was reviewed. Mixed dyslipidemia: Diet was emphasized and patient is on appropriate medications.  His blood work is followed by his primary care physicians Patient will be seen in follow-up appointment in 6 months or earlier if the patient has any concerns    Medication Adjustments/Labs and Tests Ordered: Current medicines are reviewed at length with the patient today.  Concerns regarding medicines are outlined above.  No orders of the defined types were placed in this encounter.  No orders of the defined types were placed in this encounter.    No chief complaint on file.    History of Present Illness:    Barry Haley is a 74 y.o. male.  Patient has history of coronary artery disease, atrial fibrillation, mitral intractable valve repair, mixed  dyslipidemia.  He denies any problems at this time and takes care of activities of daily living.  No chest pain orthopnea or PND.  At the time of my evaluation, the patient is alert awake oriented and in no distress.  He is being evaluated by his hematologist for anemia issues.  Past Medical History:  Diagnosis Date  . Acute on chronic systolic CHF (congestive heart failure) ([place]) 06/29/2014  . Acute pulmonary edema ([place]) 10/26/2018  . Anemia 2016   evaluated by Dr Hinton Rao @ [facility]  . Anemia, chronic disease 11/05/2018  . Anemia, iron deficiency   . Aneurysm ([place]) 01/06/15   brain  . Aneurysm, cerebral, nonruptured 04/16/2014  . Anterior cerebral aneurysm 2016  . Anxiety state, unspecified   . Arrhythmia 08/14/2018  . BELL'S PALSY, RIGHT 07/04/2009   Qualifier: Diagnosis of  By: Patsy Baltimore RN, Langley Gauss    . Benign prostatic hyperplasia (BPH) with urinary urgency   . BPH with obstruction/lower urinary tract symptoms 09/05/2018  . Cataracts, bilateral   . Cerebral aneurysm 01/06/2015  . Cerebral aneurysm, nonruptured   . Cerebral embolism with cerebral infarction ([place]) 01/22/2013  . CHF (congestive heart failure) ([place]) 4/0/9811   Chronic systolic congestive heart failure  Formatting of this note might be different from the original. Chronic systolic congestive heart failure  . Chronic anxiety 09/05/2018  . Chronic post-traumatic stress disorder (PTSD)   . Chronic systolic CHF (congestive heart failure) ([place]) 03/24/2014   Dr. Maylon Peppers, [place] EF 55-60% on 03/22/14 TTE EF 30-35% on  04/22/14 TEE   . CKD (chronic kidney disease)    Dr. Justin Mend, [facility]  . CKD stage 3 due to type 2 diabetes mellitus ([place]) 04/06/2014  . Diabetes ([place])    IDDm x 3 years  . Difficult intubation 01/2013   /[facility] 01/27/13: anterior cords; unsuccessful with MAC4 and MIL3, successful with #5 Glidescope  . DKA (diabetic ketoacidoses) ([place]) 04/21/2014  . DKA, type 2, not at goal [facility])  05/27/2014  . DM (diabetes mellitus), type 2 with renal complications ([place]) 12/05/100  . DOE (dyspnea on exertion) 07/09/2017  . DYSLIPIDEMIA 07/04/2009   Qualifier: Diagnosis of  By: Patsy Baltimore RN, Langley Gauss    . Dyspnea 04/06/2014   Multifactorial related to restrictive thoracic defect from obesity and associated diastolic heart failure on chronic basis Spirometry 04/06/2014: FEV1 62% predicted FVC 56% predicted FEV1 to FEC ratio 86% predicted FEF 25 7576% of predicted consistent with moderate restricted defect   . Elevated TSH 11/05/2018  . 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 Epistaxis   . Essential hypertension, benign 09/27/2014  . Finger osteomyelitis, left ([place]) 07/13/2015  . Former smoker 10/26/2018  . GERD (gastroesophageal reflux disease)   . GERD without esophagitis 08/14/2018  . H/O epistaxis   . High cholesterol   . History of bacterial endocarditis 01/22/2013  . History of cerebral aneurysm 10/26/2018   Formatting of this note might be different from the original. Anterior cerebral aneurysm 01/06/2015  . History of gout 06/05/2018  . History of hiatal hernia   . History of mitral valve repair 07/09/2017  . History of renal insufficiency syndrome 04/15/2015  . History of tricuspid valve repair 07/09/2017  . Hx of migraines   . Hyperlipidemia   . Hypertension   . Left homonymous hemianopsia 04/21/2014  . Limb weakness 01/22/2013  . Meningitis 2010  . MIGRAINES, HX OF 07/04/2009   Qualifier: Diagnosis of  By: Patsy Baltimore RN, Langley Gauss    . Mixed dyslipidemia 07/09/2017  . Need for influenza vaccination 06/05/2018  . Neuropathy   . Nocturia more than twice per night   . Obstructive sleep apnea 04/02/2014   Cpap 8 cmh20 nasal mask [place].  DME [facility]  . Osteomyelitis of hand, left, acute ([place]) 07/14/2015  . Other shock without mention of trauma   . Other specified postprocedural states 04/15/2015  . Pancytopenia ([place]) 12/08/2018  . Perforated left tympanic membrane on examination 07/22/2018  .  Persistent atrial fibrillation ([place]) 01/31/2016  . Personal history of other diseases of digestive system   . Personal history of other disorder of urinary system   . PVCs (premature ventricular contractions) 04/02/2014  . S/P mitral valve repair 03/24/2014   Jan 27, 2013, [facility]   . S/P tricuspid valve repair 03/24/2014   Jan 27, 2013, [facility] Due to native valve endocarditis MRSA   . Screening for prostate cancer 09/05/2018  . Seborrheic dermatitis   . Shortness of breath dyspnea   . Sleep apnea    does not wear his cpap  . Stage 4 chronic kidney disease ([place]) 09/05/2018  . Status post cerebral aneurysm repair 04/15/2015  . Suspected COVID-19 virus infection 09/07/2019  . Type 2 diabetes mellitus ([place]) 07/13/2015  . Type 2 diabetes mellitus with hyperglycemia, without long-term current use of insulin ([place]) 09/27/2014  . Type 2 diabetes mellitus with stage 3 chronic kidney disease, with long-term current use of insulin ([place]) 08/03/2015  . Type II or unspecified type diabetes mellitus with unspecified complication, uncontrolled 05/27/2014  .  Unspecified cerebral artery occlusion with cerebral infarction   . Vitamin D deficiency 09/05/2018    Past Surgical History:  Procedure Laterality Date  . APPENDECTOMY     child  . I & D EXTREMITY Left 07/13/2015   Procedure: IRRIGATION AND DEBRIDEMENT LEFT LONG FINGER;  Surgeon: Dayna Barker, MD;  Location: [facility];  Service: Plastics;  Laterality: Left;  . INFECTED SKIN DEBRIDEMENT Left 07/13/2015   left long finger  . MINIMALLY INVASIVE TRICUSPID VALVE REPAIR  01/27/2013  . mitral valve reconstruction with cardiopulmonary bypass  01/27/2013  . RADIOLOGY WITH ANESTHESIA N/A 04/23/2014   Procedure: RADIOLOGY WITH ANESTHESIA;  Surgeon: Medication Radiologist, MD;  Location: [facility];  Service: Radiology;  Laterality: N/A;  . RADIOLOGY WITH ANESTHESIA N/A 01/06/2015   Procedure: Pipeline Embolization;  Surgeon: Consuella Lose, MD;  Location: [facility];  Service: Radiology;  Laterality: N/A;  . TEE WITHOUT CARDIOVERSION N/A 04/22/2014   Procedure: TRANSESOPHAGEAL ECHOCARDIOGRAM (TEE);  Surgeon: Thayer Headings, MD;  Location: [facility] ENDOSCOPY;  Service: Cardiovascular;  Laterality: N/A;    Current Medications: Current Meds  Medication Sig  . albuterol (VENTOLIN HFA) 108 (90 Base) MCG/ACT inhaler Inhale 2 puffs into the lungs every 6 (six) hours as needed for wheezing.  Marland Kitchen amLODipine (NORVASC) 10 MG tablet Take 1 tablet (10 mg total) by mouth daily.  Marland Kitchen aspirin EC 81 MG tablet Take 81 mg daily by mouth.  Marland Kitchen atorvastatin (LIPITOR) 10 MG tablet Take 10 mg by mouth at bedtime.  . citalopram (CELEXA) 20 MG tablet Take 1 tablet by mouth daily.  Marland Kitchen ELIQUIS 5 MG TABS tablet TAKE 1 TABLET TWICE A DAY  . ergocalciferol (VITAMIN D2) 50000 UNITS capsule Take 50,000 Units by mouth every 30 (thirty) days. No specific day  . esomeprazole (NEXIUM) 40 MG capsule Take 40 mg by mouth daily at 12 noon.  . folic acid (FOLVITE) 1 MG tablet Take 1 mg by mouth daily.  . furosemide (LASIX) 40 MG tablet Take 40 mg by mouth daily.  . hydrALAZINE (APRESOLINE) 10 MG tablet Take 10 mg by mouth 3 (three) times daily.   . hydrOXYzine (ATARAX/VISTARIL) 10 MG tablet Take 1 tablet by mouth 3 (three) times daily as needed.  . IRON PO Take by mouth.  . metoprolol succinate (TOPROL-XL) 50 MG 24 hr tablet Take 25 mg by mouth 2 (two) times daily. Take with or immediately following a meal.  . Tamsulosin HCl (FLOMAX) 0.4 MG CAPS Take 0.4 mg by mouth 2 (two) times daily.   Marland Kitchen ULORIC 80 MG TABS Take 1 tablet by mouth daily.  . vitamin B-12 (CYANOCOBALAMIN) 1000 MCG tablet Take 1,000 mcg by mouth daily.       Allergies:   Hctz [hydrochlorothiazide], Ibuprofen, Niaspan [niacin er], and Zolpidem   Social History   Socioeconomic History  . Marital status: Married    Spouse name: Joelene Millin  . Number of children: 1  . Years of education: HS  . Highest education level: Not on file    Occupational History  . Occupation: Retired    Fish farm manager: OTHER  Tobacco Use  . Smoking status: Former Smoker    Packs/day: 2.50    Years: 40.00    Pack years: 100.00    Types: Cigarettes    Quit date: 10/04/2008    Years since quitting: 11.2  . Smokeless tobacco: Never Used  Substance and Sexual Activity  . Alcohol use: No    Alcohol/week: 0.0 standard drinks    Comment: quit: 10/04/2008-  rarely now  . Drug use: No  . Sexual activity: Never  Other Topics Concern  . Not on file  Social History Narrative   Patient is married Joelene Millin) and lives at home with his wife.   Patient has one adult child.   Patient is retired.   Patient has a high school education.   Patient is right-handed.   Caffeine Use: Sodas- 3 cups   Patient lives at home with family.    Social Determinants of Health   Financial Resource Strain:   . Difficulty of Paying Living Expenses:   Food Insecurity:   . Worried About Charity fundraiser in the Last Year:   . Arboriculturist in the Last Year:   Transportation Needs:   . Film/video editor (Medical):   Marland Kitchen Lack of Transportation (Non-Medical):   Physical Activity:   . Days of Exercise per Week:   . Minutes of Exercise per Session:   Stress:   . Feeling of Stress :   Social Connections:   . Frequency of Communication with Friends and Family:   . Frequency of Social Gatherings with Friends and Family:   . Attends Religious Services:   . Active Member of Clubs or Organizations:   . Attends Archivist Meetings:   Marland Kitchen Marital Status:      Family History: The patient's family history includes Alzheimer's disease in his father; Cervical cancer in his mother; Diabetes in his brother, father, and mother; Heart failure in his mother.  ROS:   Please see the history of present illness.    All other systems reviewed and are negative.  EKGs/Labs/Other Studies Reviewed:    The following studies were reviewed today: I discussed my findings with  the patient at length   Recent Labs: No results found for requested labs within last 8760 hours.  Recent Lipid Panel    Component Value Date/Time   CHOL 142 04/16/2014 1604   TRIG 196 (H) 04/16/2014 1604   HDL 25 (L) 04/16/2014 1604   CHOLHDL 5.7 (H) 04/16/2014 1604   CHOLHDL 6.2 01/04/2010 0700   VLDL 22 01/04/2010 0700   LDLCALC 78 04/16/2014 1604    Physical Exam:    VS:  BP (!) 132/58   Pulse (!) 55   Temp 97.9 F (36.6 C)   Ht 5\' 10"  (1.778 m)   Wt 210 lb (95.3 kg)   SpO2 97%   BMI 30.13 kg/m     Wt Readings from Last 3 Encounters:  12/25/19 210 lb (95.3 kg)  06/09/19 210 lb (95.3 kg)  11/14/18 238 lb (108 kg)     GEN: Patient is in no acute distress HEENT: Normal NECK: No JVD; No carotid bruits LYMPHATICS: No lymphadenopathy CARDIAC: Hear sounds regular, 2/6 systolic murmur at the apex. RESPIRATORY:  Clear to auscultation without rales, wheezing or rhonchi  ABDOMEN: Soft, non-tender, non-distended MUSCULOSKELETAL:  No edema; No deformity  SKIN: Warm and dry NEUROLOGIC:  Alert and oriented x 3 PSYCHIATRIC:  Normal affect   Signed, Jenean Lindau, MD  12/25/2019 3:36 PM    [facility]

## 2019-12-25 NOTE — Patient Instructions (Signed)
Medication Instructions:  Your physician has recommended you make the following change in your medication:   Take nitroglycerin as needed for chest pain.  *If you need a refill on your cardiac medications before your next appointment, please call your pharmacy*   Lab Work: None ordered If you have labs (blood work) drawn today and your tests are completely normal, you will receive your results only by: Marland Kitchen MyChart Message (if you have MyChart) OR . A paper copy in the mail If you have any lab test that is abnormal or we need to change your treatment, we will call you to review the results.   Testing/Procedures: None ordered   Follow-Up: At [facility], you and your health needs are our priority.  As part of our continuing mission to provide you with exceptional heart care, we have created designated Provider Care Teams.  These Care Teams include your primary Cardiologist (physician) and Advanced Practice Providers (APPs -  Physician Assistants and Nurse Practitioners) who all work together to provide you with the care you need, when you need it.  We recommend signing up for the patient portal called "MyChart".  Sign up information is provided on this After Visit Summary.  MyChart is used to connect with patients for Virtual Visits (Telemedicine).  Patients are able to view lab/test results, encounter notes, upcoming appointments, etc.  Non-urgent messages can be sent to your provider as well.   To learn more about what you can do with MyChart, go to [email or web address].    Your next appointment:   6 month(s)  The format for your next appointment:   In Person  Provider:   Jyl Heinz, MD   Other Instructions Nitroglycerin sublingual tablets What is this medicine? NITROGLYCERIN (nye troe GLI ser in) is a type of vasodilator. It relaxes blood vessels, increasing the blood and oxygen supply to your heart. This medicine is used to relieve chest pain caused by angina. It is  also used to prevent chest pain before activities like climbing stairs, going outdoors in cold weather, or sexual activity. This medicine may be used for other purposes; ask your health care provider or pharmacist if you have questions. COMMON BRAND NAME(S): Nitroquick, Nitrostat, Nitrotab What should I tell my health care provider before I take this medicine? They need to know if you have any of these conditions:  anemia  head injury, recent stroke, or bleeding in the brain  liver disease  previous heart attack  an unusual or allergic reaction to nitroglycerin, other medicines, foods, dyes, or preservatives  pregnant or trying to get pregnant  breast-feeding How should I use this medicine? Take this medicine by mouth as needed. At the first sign of an angina attack (chest pain or tightness) place one tablet under your tongue. You can also take this medicine 5 to 10 minutes before an event likely to produce chest pain. Follow the directions on the prescription label. Let the tablet dissolve under the tongue. Do not swallow whole. Replace the dose if you accidentally swallow it. It will help if your mouth is not dry. Saliva around the tablet will help it to dissolve more quickly. Do not eat or drink, smoke or chew tobacco while a tablet is dissolving. If you are not better within 5 minutes after taking ONE dose of nitroglycerin, call 9-1-1 immediately to seek emergency medical care. Do not take more than 3 nitroglycerin tablets over 15 minutes. If you take this medicine often to relieve symptoms of angina, your doctor  or health care professional may provide you with different instructions to manage your symptoms. If symptoms do not go away after following these instructions, it is important to call 9-1-1 immediately. Do not take more than 3 nitroglycerin tablets over 15 minutes. Talk to your pediatrician regarding the use of this medicine in children. Special care may be needed. Overdosage: If  you think you have taken too much of this medicine contact a poison control center or emergency room at once. NOTE: This medicine is only for you. Do not share this medicine with others. What if I miss a dose? This does not apply. This medicine is only used as needed. What may interact with this medicine? Do not take this medicine with any of the following medications:  certain migraine medicines like ergotamine and dihydroergotamine (DHE)  medicines used to treat erectile dysfunction like sildenafil, tadalafil, and vardenafil  riociguat This medicine may also interact with the following medications:  alteplase  aspirin  heparin  medicines for high blood pressure  medicines for mental depression  other medicines used to treat angina  phenothiazines like chlorpromazine, mesoridazine, prochlorperazine, thioridazine This list may not describe all possible interactions. Give your health care provider a list of all the medicines, herbs, non-prescription drugs, or dietary supplements you use. Also tell them if you smoke, drink alcohol, or use illegal drugs. Some items may interact with your medicine. What should I watch for while using this medicine? Tell your doctor or health care professional if you feel your medicine is no longer working. Keep this medicine with you at all times. Sit or lie down when you take your medicine to prevent falling if you feel dizzy or faint after using it. Try to remain calm. This will help you to feel better faster. If you feel dizzy, take several deep breaths and lie down with your feet propped up, or bend forward with your head resting between your knees. You may get drowsy or dizzy. Do not drive, use machinery, or do anything that needs mental alertness until you know how this drug affects you. Do not stand or sit up quickly, especially if you are an older patient. This reduces the risk of dizzy or fainting spells. Alcohol can make you more drowsy and  dizzy. Avoid alcoholic drinks. Do not treat yourself for coughs, colds, or pain while you are taking this medicine without asking your doctor or health care professional for advice. Some ingredients may increase your blood pressure. What side effects may I notice from receiving this medicine? Side effects that you should report to your doctor or health care professional as soon as possible:  blurred vision  dry mouth  skin rash  sweating  the feeling of extreme pressure in the head  unusually weak or tired Side effects that usually do not require medical attention (report to your doctor or health care professional if they continue or are bothersome):  flushing of the face or neck  headache  irregular heartbeat, palpitations  nausea, vomiting This list may not describe all possible side effects. Call your doctor for medical advice about side effects. You may report side effects to FDA at 1-800-FDA-1088. Where should I keep my medicine? Keep out of the reach of children. Store at room temperature between 20 and 25 degrees C (68 and 77 degrees F). Store in original container. Protect from light and moisture. Keep tightly closed. Throw away any unused medicine after the expiration date. NOTE: This sheet is a summary. It may not   cover all possible information. If you have questions about this medicine, talk to your doctor, pharmacist, or health care provider.  2020 Elsevier/Gold Standard (2013-06-18 17:57:36)   

## 2019-12-28 DIAGNOSIS — N183 Chronic kidney disease, stage 3 unspecified: Secondary | ICD-10-CM | POA: Diagnosis not present

## 2019-12-28 DIAGNOSIS — D631 Anemia in chronic kidney disease: Secondary | ICD-10-CM | POA: Diagnosis not present

## 2019-12-31 DIAGNOSIS — N189 Chronic kidney disease, unspecified: Secondary | ICD-10-CM | POA: Diagnosis not present

## 2019-12-31 DIAGNOSIS — D631 Anemia in chronic kidney disease: Secondary | ICD-10-CM | POA: Diagnosis not present

## 2020-01-04 DIAGNOSIS — D631 Anemia in chronic kidney disease: Secondary | ICD-10-CM | POA: Diagnosis not present

## 2020-01-04 DIAGNOSIS — N183 Chronic kidney disease, stage 3 unspecified: Secondary | ICD-10-CM | POA: Diagnosis not present

## 2020-01-11 DIAGNOSIS — N183 Chronic kidney disease, stage 3 unspecified: Secondary | ICD-10-CM | POA: Diagnosis not present

## 2020-01-11 DIAGNOSIS — D631 Anemia in chronic kidney disease: Secondary | ICD-10-CM | POA: Diagnosis not present

## 2020-01-13 DIAGNOSIS — D631 Anemia in chronic kidney disease: Secondary | ICD-10-CM | POA: Diagnosis not present

## 2020-01-13 DIAGNOSIS — N189 Chronic kidney disease, unspecified: Secondary | ICD-10-CM | POA: Diagnosis not present

## 2020-01-18 DIAGNOSIS — N183 Chronic kidney disease, stage 3 unspecified: Secondary | ICD-10-CM | POA: Diagnosis not present

## 2020-01-18 DIAGNOSIS — D631 Anemia in chronic kidney disease: Secondary | ICD-10-CM | POA: Diagnosis not present

## 2020-01-20 DIAGNOSIS — N189 Chronic kidney disease, unspecified: Secondary | ICD-10-CM | POA: Diagnosis not present

## 2020-01-20 DIAGNOSIS — D631 Anemia in chronic kidney disease: Secondary | ICD-10-CM | POA: Diagnosis not present

## 2020-01-25 DIAGNOSIS — N183 Chronic kidney disease, stage 3 unspecified: Secondary | ICD-10-CM | POA: Diagnosis not present

## 2020-01-25 DIAGNOSIS — D631 Anemia in chronic kidney disease: Secondary | ICD-10-CM | POA: Diagnosis not present

## 2020-02-02 DEATH — deceased
